# Patient Record
Sex: Female | Born: 1965 | Race: Black or African American | Hispanic: No | Marital: Single | Smoking: Never smoker
Health system: Southern US, Community
[De-identification: ages and names within clinical notes are randomized; demographics above are authoritative.]

## PROBLEM LIST (undated history)

## (undated) DIAGNOSIS — K219 Gastro-esophageal reflux disease without esophagitis: Secondary | ICD-10-CM

## (undated) DIAGNOSIS — Z973 Presence of spectacles and contact lenses: Secondary | ICD-10-CM

## (undated) DIAGNOSIS — D219 Benign neoplasm of connective and other soft tissue, unspecified: Secondary | ICD-10-CM

## (undated) DIAGNOSIS — G4700 Insomnia, unspecified: Secondary | ICD-10-CM

## (undated) DIAGNOSIS — I10 Essential (primary) hypertension: Secondary | ICD-10-CM

## (undated) DIAGNOSIS — K589 Irritable bowel syndrome without diarrhea: Secondary | ICD-10-CM

## (undated) DIAGNOSIS — E119 Type 2 diabetes mellitus without complications: Secondary | ICD-10-CM

## (undated) DIAGNOSIS — F419 Anxiety disorder, unspecified: Secondary | ICD-10-CM

## (undated) DIAGNOSIS — R319 Hematuria, unspecified: Secondary | ICD-10-CM

## (undated) DIAGNOSIS — E669 Obesity, unspecified: Secondary | ICD-10-CM

## (undated) DIAGNOSIS — R011 Cardiac murmur, unspecified: Secondary | ICD-10-CM

## (undated) DIAGNOSIS — K760 Fatty (change of) liver, not elsewhere classified: Secondary | ICD-10-CM

## (undated) DIAGNOSIS — C642 Malignant neoplasm of left kidney, except renal pelvis: Secondary | ICD-10-CM

## (undated) DIAGNOSIS — R945 Abnormal results of liver function studies: Secondary | ICD-10-CM

## (undated) DIAGNOSIS — N920 Excessive and frequent menstruation with regular cycle: Secondary | ICD-10-CM

## (undated) DIAGNOSIS — D649 Anemia, unspecified: Secondary | ICD-10-CM

## (undated) DIAGNOSIS — E559 Vitamin D deficiency, unspecified: Secondary | ICD-10-CM

## (undated) DIAGNOSIS — R3915 Urgency of urination: Secondary | ICD-10-CM

## (undated) DIAGNOSIS — M797 Fibromyalgia: Secondary | ICD-10-CM

## (undated) HISTORY — PX: WISDOM TOOTH EXTRACTION: SHX21

## (undated) HISTORY — PX: SKIN LESION EXCISION: SHX2412

## (undated) HISTORY — PX: BREAST BIOPSY: SHX20

## (undated) HISTORY — PX: COLONOSCOPY: SHX174

---

## 2010-12-30 HISTORY — PX: SKIN LESION EXCISION: SHX2412

## 2011-08-09 DIAGNOSIS — E669 Obesity, unspecified: Secondary | ICD-10-CM | POA: Insufficient documentation

## 2011-08-09 DIAGNOSIS — R238 Other skin changes: Secondary | ICD-10-CM | POA: Insufficient documentation

## 2011-08-09 DIAGNOSIS — R300 Dysuria: Secondary | ICD-10-CM | POA: Insufficient documentation

## 2011-08-09 HISTORY — DX: Dysuria: R30.0

## 2011-08-13 DIAGNOSIS — R519 Headache, unspecified: Secondary | ICD-10-CM | POA: Insufficient documentation

## 2011-08-13 DIAGNOSIS — G4700 Insomnia, unspecified: Secondary | ICD-10-CM | POA: Insufficient documentation

## 2011-08-13 HISTORY — DX: Headache, unspecified: R51.9

## 2013-12-14 DIAGNOSIS — N8003 Adenomyosis of the uterus: Secondary | ICD-10-CM | POA: Insufficient documentation

## 2016-01-05 ENCOUNTER — Other Ambulatory Visit: Payer: Self-pay | Admitting: Obstetrics & Gynecology

## 2016-01-08 DIAGNOSIS — R945 Abnormal results of liver function studies: Secondary | ICD-10-CM

## 2016-01-08 DIAGNOSIS — I10 Essential (primary) hypertension: Secondary | ICD-10-CM | POA: Insufficient documentation

## 2016-01-08 DIAGNOSIS — F419 Anxiety disorder, unspecified: Secondary | ICD-10-CM | POA: Insufficient documentation

## 2016-01-08 HISTORY — DX: Abnormal results of liver function studies: R94.5

## 2016-01-18 ENCOUNTER — Ambulatory Visit (HOSPITAL_COMMUNITY)
Admission: RE | Admit: 2016-01-18 | Payer: Managed Care, Other (non HMO) | Source: Ambulatory Visit | Admitting: Obstetrics & Gynecology

## 2016-01-18 ENCOUNTER — Encounter (HOSPITAL_COMMUNITY): Admission: RE | Payer: Self-pay | Source: Ambulatory Visit

## 2016-01-18 SURGERY — ROBOTIC ASSISTED TOTAL HYSTERECTOMY WITH SALPINGECTOMY
Anesthesia: General | Laterality: Bilateral

## 2016-02-02 DIAGNOSIS — E119 Type 2 diabetes mellitus without complications: Secondary | ICD-10-CM | POA: Insufficient documentation

## 2016-02-02 DIAGNOSIS — M7632 Iliotibial band syndrome, left leg: Secondary | ICD-10-CM

## 2016-02-02 HISTORY — DX: Iliotibial band syndrome, left leg: M76.32

## 2016-08-27 DIAGNOSIS — E7800 Pure hypercholesterolemia, unspecified: Secondary | ICD-10-CM | POA: Insufficient documentation

## 2016-08-27 DIAGNOSIS — G44229 Chronic tension-type headache, not intractable: Secondary | ICD-10-CM | POA: Insufficient documentation

## 2016-08-27 DIAGNOSIS — R7989 Other specified abnormal findings of blood chemistry: Secondary | ICD-10-CM | POA: Insufficient documentation

## 2016-08-27 DIAGNOSIS — E1169 Type 2 diabetes mellitus with other specified complication: Secondary | ICD-10-CM | POA: Insufficient documentation

## 2016-12-30 DIAGNOSIS — C642 Malignant neoplasm of left kidney, except renal pelvis: Secondary | ICD-10-CM

## 2016-12-30 HISTORY — DX: Malignant neoplasm of left kidney, except renal pelvis: C64.2

## 2016-12-30 HISTORY — PX: OTHER SURGICAL HISTORY: SHX169

## 2017-01-01 DIAGNOSIS — D649 Anemia, unspecified: Secondary | ICD-10-CM | POA: Insufficient documentation

## 2017-01-21 ENCOUNTER — Encounter: Payer: Self-pay | Admitting: Obstetrics & Gynecology

## 2017-06-03 ENCOUNTER — Ambulatory Visit (INDEPENDENT_AMBULATORY_CARE_PROVIDER_SITE_OTHER): Payer: Managed Care, Other (non HMO) | Admitting: Obstetrics & Gynecology

## 2017-06-03 ENCOUNTER — Encounter: Payer: Self-pay | Admitting: Obstetrics & Gynecology

## 2017-06-03 VITALS — BP 136/88

## 2017-06-03 DIAGNOSIS — D259 Leiomyoma of uterus, unspecified: Secondary | ICD-10-CM

## 2017-06-03 DIAGNOSIS — N921 Excessive and frequent menstruation with irregular cycle: Secondary | ICD-10-CM

## 2017-06-03 DIAGNOSIS — N898 Other specified noninflammatory disorders of vagina: Secondary | ICD-10-CM

## 2017-06-03 MED ORDER — FLUCONAZOLE 150 MG PO TABS: 150.0000 mg | ORAL_TABLET | Freq: Every day | ORAL | 1 refills | Status: AC

## 2017-06-03 NOTE — Progress Notes (Signed)
    Sheri Murphy 1966/01/20 [id number]        51 y.o.  G3P0A3  RP:  Metrorrhagia post DepoProvera 12/2016  HPI:  H/O Uterine Fibroids.  Last pelvic US 03/2015:  Uterus 145.3 cc, 3 myomas from 2+ to 3+ cm, normal endometrium, normal ovaries.  Had 1 DepoProvera 12/31/2016.  Irregular mild vaginal bleeding.  No pelvic pain.  Seen by Gustavo Lah at [facility] 04/30/2017 when she was started on Camila.  Past medical history,surgical history, problem list, medications, allergies, family history and social history were all reviewed and documented in the EPIC chart.  Directed ROS with pertinent positives and negatives documented in the history of present illness/assessment and plan.  Exam:  Vitals:   06/03/17 1543  BP: 136/88   General appearance:  Normal  Gyn exam:  Vulva normal.                    Speculum:  Increased vaginal d/c c/w yeast.  Cervix/Vagina otherwise normal.                    Bimanual exam: Uterus AV mildly increased in volume, mobile.  No adnexal mass felt.  Assessment/Plan:  51 y.o. G3P0A3  1. Metrorrhagia Post DepoProvera x 1 dose 12/2016.  Started on Camila 04/30/2017.  Recommended to continue, may improve until 3rd pack.  F/U Pelvic US to r/o Endometrial pathology and reevaluate the uterine fibroids. -Pelvic US-Non Ob, future  2. Uterine leiomyoma, unspecified location Known Uterine Myomas.  Last Pelvic US 03/2015.  Will repeat at f/u. -Pelvic US-Non Ob, future  3. Vaginal discharge Yeast Vaginitis present.  Fluconazole prescribed.  Instructions given.  Counseling on above issues >50% x 25 minutes.  [name] MD, 4:03 PM 06/03/2017

## 2017-06-06 ENCOUNTER — Other Ambulatory Visit: Payer: Self-pay | Admitting: Obstetrics & Gynecology

## 2017-06-06 ENCOUNTER — Telehealth: Payer: Self-pay | Admitting: Obstetrics & Gynecology

## 2017-06-06 DIAGNOSIS — N921 Excessive and frequent menstruation with irregular cycle: Secondary | ICD-10-CM

## 2017-06-06 DIAGNOSIS — D259 Leiomyoma of uterus, unspecified: Secondary | ICD-10-CM

## 2017-06-06 NOTE — Telephone Encounter (Signed)
Needs a Pelvic US, please schedule.

## 2017-06-06 NOTE — Patient Instructions (Signed)
1. Metrorrhagia Post DepoProvera x 1 dose 12/2016.  Started on Camila 04/30/2017.  Recommended to continue, may improve until 3rd pack.  F/U Pelvic US to r/o Endometrial pathology and reevaluate the uterine fibroids. -Pelvic US-Non Ob, future  2. Uterine leiomyoma, unspecified location Known Uterine Myomas.  Last Pelvic US 03/2015.  Will repeat at f/u. -Pelvic US-Non Ob, future  3. Vaginal discharge Yeast Vaginitis present.  Fluconazole prescribed.  Instructions given.  Sheri Murphy, it was a pleasure to see you today!  Please Schedule a pelvic US to reassess your uterine fibroids and evaluate the Endometrial lining.

## 2017-06-10 ENCOUNTER — Telehealth: Payer: Self-pay | Admitting: *Deleted

## 2017-06-10 DIAGNOSIS — D250 Submucous leiomyoma of uterus: Secondary | ICD-10-CM

## 2017-06-10 NOTE — Telephone Encounter (Signed)
Pt returned my call, I called her back and received her voicemail. Per DPR access I left a detailed message with recommendations, order placed for ultrasound. Asked pt to call front desk to schedule.

## 2017-06-10 NOTE — Telephone Encounter (Signed)
Left message for pt to call.

## 2017-06-10 NOTE — Telephone Encounter (Signed)
-----   Message from [name], MD sent at 06/06/2017  9:42 PM EDT ----- Regarding: Pelvic US to schedule Please call patient to inform that after revision of her chart, I recommend repeating a Pelvic US to assess her endometrial lining and reevaluate her fibroids.

## 2017-06-18 ENCOUNTER — Encounter: Payer: Self-pay | Admitting: Obstetrics & Gynecology

## 2017-06-18 ENCOUNTER — Other Ambulatory Visit: Payer: Self-pay | Admitting: Obstetrics & Gynecology

## 2017-06-18 ENCOUNTER — Ambulatory Visit (INDEPENDENT_AMBULATORY_CARE_PROVIDER_SITE_OTHER): Payer: Managed Care, Other (non HMO)

## 2017-06-18 ENCOUNTER — Ambulatory Visit (INDEPENDENT_AMBULATORY_CARE_PROVIDER_SITE_OTHER): Payer: Managed Care, Other (non HMO) | Admitting: Obstetrics & Gynecology

## 2017-06-18 VITALS — BP 118/76

## 2017-06-18 DIAGNOSIS — N852 Hypertrophy of uterus: Secondary | ICD-10-CM | POA: Diagnosis not present

## 2017-06-18 DIAGNOSIS — D251 Intramural leiomyoma of uterus: Secondary | ICD-10-CM

## 2017-06-18 DIAGNOSIS — N83202 Unspecified ovarian cyst, left side: Secondary | ICD-10-CM

## 2017-06-18 DIAGNOSIS — N921 Excessive and frequent menstruation with irregular cycle: Secondary | ICD-10-CM

## 2017-06-18 DIAGNOSIS — D250 Submucous leiomyoma of uterus: Secondary | ICD-10-CM

## 2017-06-18 NOTE — Patient Instructions (Signed)
1. Intramural leiomyoma of uterus No significant growth of Uterine fibroids in 2 years.  Normal pelvic US otherwise.  Reassured.  2. Metrorrhagia No vaginal bleeding x last visit.  Normal Endometrial line at 7.1 mm.  Will start [place] now.    F/U when due for Annual/Gyn visit.  Good to see you today Sheri Murphy!

## 2017-06-18 NOTE — Progress Notes (Signed)
    Keashia Haskins 02-Dec-1966 [id number]        51 y.o.  G3P0A3  RP:  Pelvic US for Metrorrhagia and f/u uterine Fibroids  HPI:  Camila not started yet because was waiting on period.  No pelvic pain.  Last Pelvic US 03/2015 at [facility] 3 Myomas from 2+ to 3+ cm, overall 145.3 cc Uterus.  DepoProvera 12/2016, only that one dose.    Past medical history,surgical history, problem list, medications, allergies, family history and social history were all reviewed and documented in the EPIC chart.  Directed ROS with pertinent positives and negatives documented in the history of present illness/assessment and plan.  Exam:  Vitals:   06/18/17 1635  BP: 118/76   General appearance:  Normal  Pelvic US today:  T/V and T/A anteverted heterogeneous intramural fibroid at 4.5 x 4.6 x 3.8 cm, 3.6 x 2.6 cm, 2.5 x 1.8 cm, 2.2 x 1.6 cm, 2.0 x 1.7 cm, 1.6 x 1.0 cm, 1.5 x 1.4 cm.  Endometrial line normal at 7.1 mm.  Right ovary normal.  Left ovary with a thin-walled echofree follicle 2.9 x 1.9 cm, neg CFD.  No FF in CDS.  Assessment/Plan:  51 y.o.   1. Intramural leiomyoma of uterus No significant growth of Uterine fibroids in 2 years.  Normal pelvic US otherwise.  Reassured.  2. Metrorrhagia No vaginal bleeding x last visit.  Normal Endometrial line at 7.1 mm.  Will start [place] now.    F/U when due for Annual/Gyn visit.  Counseling on above issues >50% x 15 minutes.  [name] MD, 4:46 PM 06/18/2017

## 2017-07-30 HISTORY — PX: OTHER SURGICAL HISTORY: SHX169

## 2017-08-18 DIAGNOSIS — Z85528 Personal history of other malignant neoplasm of kidney: Secondary | ICD-10-CM | POA: Insufficient documentation

## 2017-09-30 DIAGNOSIS — J309 Allergic rhinitis, unspecified: Secondary | ICD-10-CM | POA: Insufficient documentation

## 2017-09-30 DIAGNOSIS — F330 Major depressive disorder, recurrent, mild: Secondary | ICD-10-CM | POA: Insufficient documentation

## 2017-12-25 ENCOUNTER — Other Ambulatory Visit: Payer: Self-pay | Admitting: Obstetrics & Gynecology

## 2017-12-25 DIAGNOSIS — Z1231 Encounter for screening mammogram for malignant neoplasm of breast: Secondary | ICD-10-CM

## 2018-01-22 ENCOUNTER — Ambulatory Visit
Admission: RE | Admit: 2018-01-22 | Discharge: 2018-01-22 | Disposition: A | Discharge status: Home / Self Care | Payer: Managed Care, Other (non HMO) | Source: Ambulatory Visit | Attending: Obstetrics & Gynecology | Admitting: Obstetrics & Gynecology

## 2018-01-22 DIAGNOSIS — Z1231 Encounter for screening mammogram for malignant neoplasm of breast: Secondary | ICD-10-CM

## 2018-02-03 ENCOUNTER — Encounter: Payer: Managed Care, Other (non HMO) | Admitting: Obstetrics & Gynecology

## 2018-03-03 ENCOUNTER — Encounter: Payer: Self-pay | Admitting: Obstetrics & Gynecology

## 2018-03-03 ENCOUNTER — Ambulatory Visit: Payer: Managed Care, Other (non HMO) | Admitting: Obstetrics & Gynecology

## 2018-03-03 VITALS — BP 130/88

## 2018-03-03 DIAGNOSIS — D251 Intramural leiomyoma of uterus: Secondary | ICD-10-CM

## 2018-03-03 DIAGNOSIS — N920 Excessive and frequent menstruation with regular cycle: Secondary | ICD-10-CM | POA: Diagnosis not present

## 2018-03-03 LAB — CBC
HCT: 36.4 % (ref 35.0–45.0)
Hemoglobin: 12.5 g/dL (ref 11.7–15.5)
MCH: 27.3 pg (ref 27.0–33.0)
MCHC: 34.3 g/dL (ref 32.0–36.0)
MCV: 79.5 fL — ABNORMAL LOW (ref 80.0–100.0)
MPV: 10.7 fL (ref 7.5–12.5)
Platelets: 344 10*3/uL (ref 140–400)
RBC: 4.58 10*6/uL (ref 3.80–5.10)
RDW: 13.1 % (ref 11.0–15.0)
WBC: 7.2 10*3/uL (ref 3.8–10.8)

## 2018-03-03 NOTE — Patient Instructions (Signed)
1. Menorrhagia with regular cycle Menorrhagia probably secondary to intramural fibroids.  Will rule out anemia.  Patient taking Iron supplement.  Patient has tried the progestin IUD, Depo-Provera injections and the progestin pill without success in the past.  Declines further medical or hormonal treatments.  Endometrial ablation, myomectomies and hysterectomy would be possible approaches.  Patient is interested in endometrial ablation.  Will follow up with a pelvic ultrasound to reevaluate the fibroids and make sure the endometrial lining is normal.  Will decide on management per results. - CBC - US Transvaginal Non-OB; Future  2. Fibroids, intramural IM Fibroids 4.6, 3.6, 2.2 and 1.5 cm in 05/2017.  Will reevaluate by Pelvic US at f/u and decide on management. - CBC - US Transvaginal Non-OB; Future  Sheri Murphy, it was a pleasure seeing you today!  I will let you know your lab results as soon as they are available.  I will see you again soon for your pelvic ultrasound.   Endometrial Ablation Endometrial ablation is a procedure that destroys the thin inner layer of the lining of the uterus (endometrium). This procedure may be done:  To stop heavy periods.  To stop bleeding that is causing anemia.  To control irregular bleeding.  To treat bleeding caused by small tumors (fibroids) in the endometrium.  This procedure is often an alternative to major surgery, such as removal of the uterus and cervix (hysterectomy). As a result of this procedure:  You may not be able to have children. However, if you are premenopausal (you have not gone through menopause): ? You may still have a small chance of getting pregnant. ? You will need to use a reliable method of birth control after the procedure to prevent pregnancy.  You may stop having a menstrual period, or you may have only a small amount of bleeding during your period. Menstruation may return several years after the procedure.  Tell a health care  provider about:  Any allergies you have.  All medicines you are taking, including vitamins, herbs, eye drops, creams, and over-the-counter medicines.  Any problems you or family members have had with the use of anesthetic medicines.  Any blood disorders you have.  Any surgeries you have had.  Any medical conditions you have. What are the risks? Generally, this is a safe procedure. However, problems may occur, including:  A hole (perforation) in the uterus or bowel.  Infection of the uterus, bladder, or vagina.  Bleeding.  Damage to other structures or organs.  An air bubble in the lung (air embolus).  Problems with pregnancy after the procedure.  Failure of the procedure.  Decreased ability to diagnose cancer in the endometrium.  What happens before the procedure?  You will have tests of your endometrium to make sure there are no pre-cancerous cells or cancer cells present.  You may have an ultrasound of the uterus.  You may be given medicines to thin the endometrium.  Ask your health care provider about: ? Changing or stopping your regular medicines. This is especially important if you take diabetes medicines or blood thinners. ? Taking medicines such as aspirin and ibuprofen. These medicines can thin your blood. Do not take these medicines before your procedure if your doctor tells you not to.  Plan to have someone take you home from the hospital or clinic. What happens during the procedure?  You will lie on an exam table with your feet and legs supported as in a pelvic exam.  To lower your risk of  infection: ? Your health care team will wash or sanitize their hands and put on germ-free (sterile) gloves. ? Your genital area will be washed with soap.  An IV tube will be inserted into one of your veins.  You will be given a medicine to help you relax (sedative).  A surgical instrument with a light and camera (resectoscope) will be inserted into your vagina and  moved into your uterus. This allows your surgeon to see inside your uterus.  Endometrial tissue will be removed using one of the following methods: ? Radiofrequency. This method uses a radiofrequency-alternating electric current to remove the endometrium. ? Cryotherapy. This method uses extreme cold to freeze the endometrium. ? Heated-free liquid. This method uses a heated saltwater (saline) solution to remove the endometrium. ? Microwave. This method uses high-energy microwaves to heat up the endometrium and remove it. ? Thermal balloon. This method involves inserting a catheter with a balloon tip into the uterus. The balloon tip is filled with heated fluid to remove the endometrium. The procedure may vary among health care providers and hospitals. What happens after the procedure?  Your blood pressure, heart rate, breathing rate, and blood oxygen level will be monitored until the medicines you were given have worn off.  As tissue healing occurs, you may notice vaginal bleeding for 4-6 weeks after the procedure. You may also experience: ? Cramps. ? Thin, watery vaginal discharge that is light pink or brown in color. ? A need to urinate more frequently than usual. ? Nausea.  Do not drive for 24 hours if you were given a sedative.  Do not have sex or insert anything into your vagina until your health care provider approves. Summary  Endometrial ablation is done to treat the many causes of heavy menstrual bleeding.  The procedure may be done only after medications have been tried to control the bleeding.  Plan to have someone take you home from the hospital or clinic. This information is not intended to replace advice given to you by your health care provider. Make sure you discuss any questions you have with your health care provider. Document Released: 10/25/2004 Document Revised: 01/02/2017 Document Reviewed: 01/02/2017 Elsevier Interactive Patient Education  2017 [employer].

## 2018-03-03 NOTE — Progress Notes (Signed)
    Sheri Murphy [birth year] [id number]        52 y.o.  No obstetric history on file.   RP: Heavy periods for 4 months  HPI: Menses regular every month with heavy flow for 3 days and periods lasting overall 5 days.  Patient had no success in the past with the Mirena IUD.  Also tried Depo-Provera injection for 1 shot which produced irregular frequent bleeding and increase in weight.  Finally tried the progestin pill last year and stopped after 6 weeks of bleeding every day.  Per patient her ferritin was low at her last visit with her family physician and she is therefore taking iron supplements.  Known fibroids.  No pelvic pain.  No vasomotor symptoms of menopause.  Urine and bowel movements normal.  Patient is scheduled for next gynecologic annual exam in May 2019.   OB History  No data available    Past medical history,surgical history, problem list, medications, allergies, family history and social history were all reviewed and documented in the EPIC chart.   Directed ROS with pertinent positives and negatives documented in the history of present illness/assessment and plan.  Exam:  Vitals:   03/03/18 0934  BP: 130/88   General appearance:  Normal  Abdomen: Normal  Gynecologic exam: Vulva normal.  Bimanual exam: Uterus anteverted, mildly increased in volume and irregular, mobile, nontender.  No adnexal mass, nontender.  Pelvic US in 05/2017: T/V and T/A anteverted heterogeneous intramural fibroid at 4.5 x 4.6 x 3.8 cm, 3.6 x 2.6 cm, 2.5 x 1.8 cm, 2.2 x 1.6 cm, 2.0 x 1.7 cm, 1.6 x 1.0 cm, 1.5 x 1.4 cm.  Endometrial line normal at 7.1 mm.  Right ovary normal.  Left ovary with a thin-walled echofree follicle 2.9 x 1.9 cm, neg CFD.  No FF in CDS.  Assessment/Plan:  52 y.o. No obstetric history on file.   1. Menorrhagia with regular cycle Menorrhagia probably secondary to intramural fibroids.  Will rule out anemia.  Patient taking Iron supplement.  Patient has tried the progestin IUD,  Depo-Provera injections and the progestin pill without success in the past.  Declines further medical or hormonal treatments.  Endometrial ablation, myomectomies and hysterectomy would be possible approaches.  Patient is interested in endometrial ablation.  Will follow up with a pelvic ultrasound to reevaluate the fibroids and make sure the endometrial lining is normal.  Will decide on management per results. - CBC - US Transvaginal Non-OB; Future  2. Fibroids, intramural IM Fibroids 4.6, 3.6, 2.2 and 1.5 cm in 05/2017.  Will reevaluate by Pelvic US at f/u and decide on management. - CBC - US Transvaginal Non-OB; Future  Counseling on above issues more than 50% for 15 minutes.  [name] MD, 9:55 AM 03/03/2018

## 2018-03-23 ENCOUNTER — Other Ambulatory Visit: Payer: Managed Care, Other (non HMO)

## 2018-03-23 ENCOUNTER — Ambulatory Visit: Payer: Managed Care, Other (non HMO) | Admitting: Obstetrics & Gynecology

## 2018-03-30 ENCOUNTER — Ambulatory Visit: Payer: Managed Care, Other (non HMO) | Admitting: Women's Health

## 2018-03-30 ENCOUNTER — Encounter: Payer: Self-pay | Admitting: Women's Health

## 2018-03-30 VITALS — BP 122/82

## 2018-03-30 DIAGNOSIS — D259 Leiomyoma of uterus, unspecified: Secondary | ICD-10-CM | POA: Diagnosis not present

## 2018-03-30 DIAGNOSIS — N760 Acute vaginitis: Secondary | ICD-10-CM

## 2018-03-30 DIAGNOSIS — I10 Essential (primary) hypertension: Secondary | ICD-10-CM

## 2018-03-30 DIAGNOSIS — N898 Other specified noninflammatory disorders of vagina: Secondary | ICD-10-CM | POA: Diagnosis not present

## 2018-03-30 DIAGNOSIS — B9689 Other specified bacterial agents as the cause of diseases classified elsewhere: Secondary | ICD-10-CM

## 2018-03-30 DIAGNOSIS — E119 Type 2 diabetes mellitus without complications: Secondary | ICD-10-CM | POA: Insufficient documentation

## 2018-03-30 LAB — WET PREP FOR TRICH, YEAST, CLUE

## 2018-03-30 MED ORDER — METRONIDAZOLE 500 MG PO TABS: 500.0000 mg | ORAL_TABLET | Freq: Two times a day (BID) | ORAL | 0 refills | Status: DC

## 2018-03-30 MED ORDER — FLUCONAZOLE 150 MG PO TABS: 150.0000 mg | ORAL_TABLET | Freq: Once | ORAL | 1 refills | Status: AC

## 2018-03-30 MED ORDER — IBUPROFEN 600 MG PO TABS: 600.0000 mg | ORAL_TABLET | Freq: Three times a day (TID) | ORAL | 1 refills | Status: DC | PRN

## 2018-03-30 NOTE — Patient Instructions (Signed)

## 2018-03-30 NOTE — Progress Notes (Signed)
52 year old old BF presents with complaint of white vaginal discharge, irritation with itching for the past 2 weeks.  Questionable partner infidelity .  Denies urinary symptoms, abdominal pain or fever.  Monthly regular 5-day cycle,  first 3 days very heavy.  History of multiple fibroids, did not have good relief with Mirena IUD, Depo-Provera or Micronor.  Wears both tampon and pad and needs to change at least every 2 hours for the first 3 days.  Medical problems include hypertension and diabetes.  Desk job in Engineer, technical sales at [employer].  Exam: Appears well.  External genitalia mild erythema and introitus, speculum exams scant clear discharge noted, GC/chlamydia culture taken, wet prep positive for clues, many bacteria.  Bimanual uterus enlarged/fibroid uterus.  Bacterial vaginosis STD screen Fibroid uterus with menorrhagia  Plan: Flagyl 500 twice daily for 7 days, alcohol precautions reviewed.  Diflucan 150 p.o. x1 dose for itching.  Instructed to call if no relief of symptoms.  Motrin 600 every 8 hours for 3 days of cycle.  Other options reviewed, reviewed if decides to proceed with endometrial ablation to schedule sonohysterogram with Dr. Dellis Filbert.  GC/Chlamydia culture pending, will check HIV, hepatitis and RPR and annual exam.

## 2018-03-31 LAB — C. TRACHOMATIS/N. GONORRHOEAE RNA
C. trachomatis RNA, TMA: NOT DETECTED
N. gonorrhoeae RNA, TMA: NOT DETECTED

## 2018-04-08 ENCOUNTER — Ambulatory Visit: Payer: Managed Care, Other (non HMO) | Admitting: Obstetrics & Gynecology

## 2018-04-08 ENCOUNTER — Ambulatory Visit (INDEPENDENT_AMBULATORY_CARE_PROVIDER_SITE_OTHER): Payer: Managed Care, Other (non HMO)

## 2018-04-08 ENCOUNTER — Encounter: Payer: Self-pay | Admitting: Obstetrics & Gynecology

## 2018-04-08 VITALS — BP 120/82

## 2018-04-08 DIAGNOSIS — D219 Benign neoplasm of connective and other soft tissue, unspecified: Secondary | ICD-10-CM | POA: Diagnosis not present

## 2018-04-08 DIAGNOSIS — N920 Excessive and frequent menstruation with regular cycle: Secondary | ICD-10-CM

## 2018-04-08 DIAGNOSIS — D251 Intramural leiomyoma of uterus: Secondary | ICD-10-CM | POA: Diagnosis not present

## 2018-04-08 NOTE — Progress Notes (Signed)
    Sheri Murphy 06-25-1966 [id number]        52 y.o.    RP: Menorrhagia with uterine fibroids  HPI: Heavy menstrual flow.  Maintain uterine fibroids per ultrasound June 2018.  Has tried progesterone IUD, Depo-Provera and the progestin pill without success previously.   OB History  No data available    Past medical history,surgical history, problem list, medications, allergies, family history and social history were all reviewed and documented in the EPIC chart.   Directed ROS with pertinent positives and negatives documented in the history of present illness/assessment and plan.  Exam:  Vitals:   04/08/18 1533  BP: 120/82   General appearance:  Normal  Pelvic US today: T/V and T/A images.  Anteverted uterus measuring 9.49 x 6.82 x 6.58 cm.  Intramural fibroids measuring 2 x 1.6, 2.3 x 1.6, 3.2 x 2.7, 3.3 x 3.1, 1.6 x 1.5, 2.1 x 2.2, 1.7 x 2.0 cm.  On the transvaginal images the endometrium is displaced by some fibroids.  Endometrial lining tri-layered at 10.1 mm.  Right ovary normal.  Left ovary normal with a dominant follicle measured at 1.9 cm.  Right and left adnexa negative.  No free fluid in the posterior cul-de-sac.   Assessment/Plan:  52 y.o. No obstetric history on file.   1. Menorrhagia with regular cycle Unsuccessful attempt to control with the progesterone IUD, Depo-Provera and progestin pills.  Patient interested in endometrial ablation.  Patient informed that given the possibility of submucosal myomas, we would proceed with a hysteroscopy and excision of the fibroids as needed with a D&C followed by the NovaSure endometrial ablation.  Patient is not ready to make a decision today, therefore she will call back with her decision.  2. Fibroids Some fibroids are partially submucosal as the endometrium is displaced.  Counseling on above issues and coordination of care more than 50% for 15 minutes.  [name] MD, 3:41 PM 04/08/2018

## 2018-04-12 ENCOUNTER — Encounter: Payer: Self-pay | Admitting: Obstetrics & Gynecology

## 2018-04-12 NOTE — Patient Instructions (Signed)
1. Menorrhagia with regular cycle Unsuccessful attempt to control with the progesterone IUD, Depo-Provera and progestin pills.  Patient interested in endometrial ablation.  Patient informed that given the possibility of submucosal myomas, we would proceed with a hysteroscopy and excision of the fibroids as needed with a D&C followed by the NovaSure endometrial ablation.  Patient is not ready to make a decision today, therefore she will call back with her decision.  2. Fibroids Some fibroids are partially submucosal as the endometrium is displaced.  Ryelynn, it was a pleasure seeing you today!   Endometrial Ablation Endometrial ablation is a procedure that destroys the thin inner layer of the lining of the uterus (endometrium). This procedure may be done:  To stop heavy periods.  To stop bleeding that is causing anemia.  To control irregular bleeding.  To treat bleeding caused by small tumors (fibroids) in the endometrium.  This procedure is often an alternative to major surgery, such as removal of the uterus and cervix (hysterectomy). As a result of this procedure:  You may not be able to have children. However, if you are premenopausal (you have not gone through menopause): ? You may still have a small chance of getting pregnant. ? You will need to use a reliable method of birth control after the procedure to prevent pregnancy.  You may stop having a menstrual period, or you may have only a small amount of bleeding during your period. Menstruation may return several years after the procedure.  Tell a health care provider about:  Any allergies you have.  All medicines you are taking, including vitamins, herbs, eye drops, creams, and over-the-counter medicines.  Any problems you or family members have had with the use of anesthetic medicines.  Any blood disorders you have.  Any surgeries you have had.  Any medical conditions you have. What are the risks? Generally, this is a  safe procedure. However, problems may occur, including:  A hole (perforation) in the uterus or bowel.  Infection of the uterus, bladder, or vagina.  Bleeding.  Damage to other structures or organs.  An air bubble in the lung (air embolus).  Problems with pregnancy after the procedure.  Failure of the procedure.  Decreased ability to diagnose cancer in the endometrium.  What happens before the procedure?  You will have tests of your endometrium to make sure there are no pre-cancerous cells or cancer cells present.  You may have an ultrasound of the uterus.  You may be given medicines to thin the endometrium.  Ask your health care provider about: ? Changing or stopping your regular medicines. This is especially important if you take diabetes medicines or blood thinners. ? Taking medicines such as aspirin and ibuprofen. These medicines can thin your blood. Do not take these medicines before your procedure if your doctor tells you not to.  Plan to have someone take you home from the hospital or clinic. What happens during the procedure?  You will lie on an exam table with your feet and legs supported as in a pelvic exam.  To lower your risk of infection: ? Your health care team will wash or sanitize their hands and put on germ-free (sterile) gloves. ? Your genital area will be washed with soap.  An IV tube will be inserted into one of your veins.  You will be given a medicine to help you relax (sedative).  A surgical instrument with a light and camera (resectoscope) will be inserted into your vagina and moved into  your uterus. This allows your surgeon to see inside your uterus.  Endometrial tissue will be removed using one of the following methods: ? Radiofrequency. This method uses a radiofrequency-alternating electric current to remove the endometrium. ? Cryotherapy. This method uses extreme cold to freeze the endometrium. ? Heated-free liquid. This method uses a heated  saltwater (saline) solution to remove the endometrium. ? Microwave. This method uses high-energy microwaves to heat up the endometrium and remove it. ? Thermal balloon. This method involves inserting a catheter with a balloon tip into the uterus. The balloon tip is filled with heated fluid to remove the endometrium. The procedure may vary among health care providers and hospitals. What happens after the procedure?  Your blood pressure, heart rate, breathing rate, and blood oxygen level will be monitored until the medicines you were given have worn off.  As tissue healing occurs, you may notice vaginal bleeding for 4-6 weeks after the procedure. You may also experience: ? Cramps. ? Thin, watery vaginal discharge that is light pink or brown in color. ? A need to urinate more frequently than usual. ? Nausea.  Do not drive for 24 hours if you were given a sedative.  Do not have sex or insert anything into your vagina until your health care provider approves. Summary  Endometrial ablation is done to treat the many causes of heavy menstrual bleeding.  The procedure may be done only after medications have been tried to control the bleeding.  Plan to have someone take you home from the hospital or clinic. This information is not intended to replace advice given to you by your health care provider. Make sure you discuss any questions you have with your health care provider. Document Released: 10/25/2004 Document Revised: 01/02/2017 Document Reviewed: 01/02/2017 Elsevier Interactive Patient Education  2017 [employer].

## 2018-04-14 DIAGNOSIS — K5900 Constipation, unspecified: Secondary | ICD-10-CM | POA: Insufficient documentation

## 2018-04-14 DIAGNOSIS — K219 Gastro-esophageal reflux disease without esophagitis: Secondary | ICD-10-CM | POA: Insufficient documentation

## 2018-04-15 ENCOUNTER — Telehealth: Payer: Self-pay

## 2018-04-15 NOTE — Telephone Encounter (Signed)
Patient in voice mail stating she is ready to schedule ablation and was to let you know when ready.   Please send order. Thanks.

## 2018-04-28 ENCOUNTER — Encounter: Payer: Self-pay | Admitting: Anesthesiology

## 2018-05-06 ENCOUNTER — Telehealth: Payer: Self-pay

## 2018-05-06 NOTE — Telephone Encounter (Signed)
I called patient because her [employer] Ins plan started over 04/29/18 and I had told her I would recheck her ins benefits to be sure all remained the same. I did that and they changed a little bit and I called and made her aware of the changes. I told her it only changed the surgery prepymt amount slightly and since it is an estimate anyway I was not going to change the amount GGA surgery prepymt I initally told her but I just wanted her to be aware her benefits had changed slightly.

## 2018-05-07 ENCOUNTER — Encounter (HOSPITAL_BASED_OUTPATIENT_CLINIC_OR_DEPARTMENT_OTHER): Payer: Self-pay

## 2018-05-08 ENCOUNTER — Encounter (HOSPITAL_BASED_OUTPATIENT_CLINIC_OR_DEPARTMENT_OTHER): Payer: Self-pay

## 2018-05-08 ENCOUNTER — Other Ambulatory Visit: Payer: Self-pay

## 2018-05-08 NOTE — Progress Notes (Signed)
Spoke with:  Quorra NPO:  After Midnight, no gum, candy, or mints   Arrival time: 0930AM Labs:  UPT (CBC, CMP, EKG 05/12/2018 in epic) AM medications:  Omeprazole Pre op orders: Yes Ride home:  Justus Memory (friend) [phone]

## 2018-05-11 ENCOUNTER — Telehealth: Payer: Self-pay

## 2018-05-11 NOTE — Progress Notes (Signed)
Ms. Bailey called to let us know her ride home for surgery will be Gonzella Lex (brother) [phone] instead of Tonya.

## 2018-05-11 NOTE — Telephone Encounter (Addendum)
Patient scheduled for [facility] Hysteroscopy with Myosure on Weds., 05/13/18.  She questions if anything on her med list she should d/c between now and surgery date. She asked specifically about Ibuprofen.  Also, she asked what to expect as far as the "pain level/discomfort level after procedure".  I did tell patient that I would check with Dr. Marguerita Merles but review of her med list looks like all her medications are probably fine to take. If she can avoid the Ibuprofen that is fine but this is a procedure with low risk of bleeding so not as worrisome to take it as some other procedures might be.  I told her it is a simple procedure and her discomfort should be minimal. Maybe some cramping/bloating and she will need to rest from the anesthesia. I reassured her she will do great.  Dr. Russ Halo this all sound ok??

## 2018-05-12 ENCOUNTER — Encounter (HOSPITAL_COMMUNITY)
Admission: RE | Admit: 2018-05-12 | Discharge: 2018-05-12 | Disposition: A | Discharge status: Home / Self Care | Payer: Managed Care, Other (non HMO) | Source: Ambulatory Visit | Attending: Obstetrics & Gynecology | Admitting: Obstetrics & Gynecology

## 2018-05-12 DIAGNOSIS — D259 Leiomyoma of uterus, unspecified: Secondary | ICD-10-CM | POA: Diagnosis present

## 2018-05-12 DIAGNOSIS — Z6830 Body mass index (BMI) 30.0-30.9, adult: Secondary | ICD-10-CM | POA: Diagnosis not present

## 2018-05-12 DIAGNOSIS — K219 Gastro-esophageal reflux disease without esophagitis: Secondary | ICD-10-CM | POA: Diagnosis not present

## 2018-05-12 DIAGNOSIS — Z85528 Personal history of other malignant neoplasm of kidney: Secondary | ICD-10-CM | POA: Diagnosis not present

## 2018-05-12 DIAGNOSIS — M797 Fibromyalgia: Secondary | ICD-10-CM | POA: Diagnosis not present

## 2018-05-12 DIAGNOSIS — E669 Obesity, unspecified: Secondary | ICD-10-CM | POA: Diagnosis not present

## 2018-05-12 DIAGNOSIS — D251 Intramural leiomyoma of uterus: Secondary | ICD-10-CM | POA: Diagnosis not present

## 2018-05-12 DIAGNOSIS — Z79899 Other long term (current) drug therapy: Secondary | ICD-10-CM | POA: Diagnosis not present

## 2018-05-12 DIAGNOSIS — I10 Essential (primary) hypertension: Secondary | ICD-10-CM | POA: Diagnosis not present

## 2018-05-12 DIAGNOSIS — E119 Type 2 diabetes mellitus without complications: Secondary | ICD-10-CM | POA: Diagnosis not present

## 2018-05-12 DIAGNOSIS — D649 Anemia, unspecified: Secondary | ICD-10-CM | POA: Diagnosis not present

## 2018-05-12 DIAGNOSIS — N840 Polyp of corpus uteri: Secondary | ICD-10-CM | POA: Diagnosis not present

## 2018-05-12 DIAGNOSIS — Z791 Long term (current) use of non-steroidal anti-inflammatories (NSAID): Secondary | ICD-10-CM | POA: Diagnosis not present

## 2018-05-12 LAB — CBC
HCT: 37.9 % (ref 36.0–46.0)
Hemoglobin: 12.6 g/dL (ref 12.0–15.0)
MCH: 26.9 pg (ref 26.0–34.0)
MCHC: 33.2 g/dL (ref 30.0–36.0)
MCV: 80.8 fL (ref 78.0–100.0)
Platelets: 374 10*3/uL (ref 150–400)
RBC: 4.69 MIL/uL (ref 3.87–5.11)
RDW: 13.7 % (ref 11.5–15.5)
WBC: 8.6 10*3/uL (ref 4.0–10.5)

## 2018-05-12 LAB — COMPREHENSIVE METABOLIC PANEL
ALT: 42 U/L (ref 14–54)
AST: 34 U/L (ref 15–41)
Albumin: 4 g/dL (ref 3.5–5.0)
Alkaline Phosphatase: 41 U/L (ref 38–126)
Anion gap: 10 (ref 5–15)
BUN: 12 mg/dL (ref 6–20)
CO2: 23 mmol/L (ref 22–32)
Calcium: 8.7 mg/dL — ABNORMAL LOW (ref 8.9–10.3)
Chloride: 101 mmol/L (ref 101–111)
Creatinine, Ser: 0.84 mg/dL (ref 0.44–1.00)
GFR calc Af Amer: >60 mL/min (ref >60)
GFR calc non Af Amer: >60 mL/min (ref >60)
Glucose, Bld: 123 mg/dL — ABNORMAL HIGH (ref 65–99)
Potassium: 3.7 mmol/L (ref 3.5–5.1)
Sodium: 134 mmol/L — ABNORMAL LOW (ref 135–145)
Total Bilirubin: 0.3 mg/dL (ref 0.3–1.2)
Total Protein: 7.3 g/dL (ref 6.5–8.1)

## 2018-05-12 NOTE — Telephone Encounter (Signed)
Called and left detailed message in voice mail per DPR access note on file and let her know that Dr. Dellis Filbert agrees with the things I advised her about yesterday.

## 2018-05-12 NOTE — Telephone Encounter (Signed)
I agree with your counseling on everything.

## 2018-05-13 ENCOUNTER — Other Ambulatory Visit: Payer: Self-pay

## 2018-05-13 ENCOUNTER — Encounter (HOSPITAL_BASED_OUTPATIENT_CLINIC_OR_DEPARTMENT_OTHER)
Admission: RE | Disposition: A | Discharge status: Home / Self Care | Payer: Self-pay | Source: Ambulatory Visit | Attending: Obstetrics & Gynecology

## 2018-05-13 ENCOUNTER — Ambulatory Visit (HOSPITAL_BASED_OUTPATIENT_CLINIC_OR_DEPARTMENT_OTHER): Payer: Managed Care, Other (non HMO) | Admitting: Anesthesiology

## 2018-05-13 ENCOUNTER — Ambulatory Visit (HOSPITAL_BASED_OUTPATIENT_CLINIC_OR_DEPARTMENT_OTHER)
Admission: RE | Admit: 2018-05-13 | Discharge: 2018-05-13 | Disposition: A | Discharge status: Home / Self Care | Payer: Managed Care, Other (non HMO) | Source: Ambulatory Visit | Attending: Obstetrics & Gynecology | Admitting: Obstetrics & Gynecology

## 2018-05-13 ENCOUNTER — Encounter (HOSPITAL_BASED_OUTPATIENT_CLINIC_OR_DEPARTMENT_OTHER): Payer: Self-pay | Admitting: *Deleted

## 2018-05-13 DIAGNOSIS — M797 Fibromyalgia: Secondary | ICD-10-CM | POA: Insufficient documentation

## 2018-05-13 DIAGNOSIS — D251 Intramural leiomyoma of uterus: Secondary | ICD-10-CM | POA: Insufficient documentation

## 2018-05-13 DIAGNOSIS — N840 Polyp of corpus uteri: Secondary | ICD-10-CM | POA: Diagnosis not present

## 2018-05-13 DIAGNOSIS — E669 Obesity, unspecified: Secondary | ICD-10-CM | POA: Insufficient documentation

## 2018-05-13 DIAGNOSIS — K219 Gastro-esophageal reflux disease without esophagitis: Secondary | ICD-10-CM | POA: Insufficient documentation

## 2018-05-13 DIAGNOSIS — Z6830 Body mass index (BMI) 30.0-30.9, adult: Secondary | ICD-10-CM | POA: Insufficient documentation

## 2018-05-13 DIAGNOSIS — Z791 Long term (current) use of non-steroidal anti-inflammatories (NSAID): Secondary | ICD-10-CM | POA: Insufficient documentation

## 2018-05-13 DIAGNOSIS — D649 Anemia, unspecified: Secondary | ICD-10-CM | POA: Insufficient documentation

## 2018-05-13 DIAGNOSIS — I10 Essential (primary) hypertension: Secondary | ICD-10-CM | POA: Insufficient documentation

## 2018-05-13 DIAGNOSIS — E119 Type 2 diabetes mellitus without complications: Secondary | ICD-10-CM | POA: Insufficient documentation

## 2018-05-13 DIAGNOSIS — Z85528 Personal history of other malignant neoplasm of kidney: Secondary | ICD-10-CM | POA: Insufficient documentation

## 2018-05-13 DIAGNOSIS — N920 Excessive and frequent menstruation with regular cycle: Secondary | ICD-10-CM | POA: Diagnosis not present

## 2018-05-13 DIAGNOSIS — Z79899 Other long term (current) drug therapy: Secondary | ICD-10-CM | POA: Insufficient documentation

## 2018-05-13 HISTORY — PX: DILATATION & CURETTAGE/HYSTEROSCOPY WITH MYOSURE: SHX6511

## 2018-05-13 HISTORY — DX: Cardiac murmur, unspecified: R01.1

## 2018-05-13 HISTORY — DX: Obesity, unspecified: E66.9

## 2018-05-13 HISTORY — DX: Fatty (change of) liver, not elsewhere classified: K76.0

## 2018-05-13 HISTORY — DX: Essential (primary) hypertension: I10

## 2018-05-13 HISTORY — DX: Type 2 diabetes mellitus without complications: E11.9

## 2018-05-13 HISTORY — DX: Gastro-esophageal reflux disease without esophagitis: K21.9

## 2018-05-13 HISTORY — DX: Anemia, unspecified: D64.9

## 2018-05-13 HISTORY — DX: Insomnia, unspecified: G47.00

## 2018-05-13 HISTORY — DX: Fibromyalgia: M79.7

## 2018-05-13 HISTORY — DX: Abnormal results of liver function studies: R94.5

## 2018-05-13 HISTORY — DX: Irritable bowel syndrome, unspecified: K58.9

## 2018-05-13 HISTORY — DX: Excessive and frequent menstruation with regular cycle: N92.0

## 2018-05-13 HISTORY — DX: Anxiety disorder, unspecified: F41.9

## 2018-05-13 HISTORY — DX: Hematuria, unspecified: R31.9

## 2018-05-13 HISTORY — DX: Malignant neoplasm of left kidney, except renal pelvis: C64.2

## 2018-05-13 HISTORY — DX: Benign neoplasm of connective and other soft tissue, unspecified: D21.9

## 2018-05-13 LAB — POCT PREGNANCY, URINE: Preg Test, Ur: NEGATIVE

## 2018-05-13 LAB — GLUCOSE, CAPILLARY: Glucose-Capillary: 104 mg/dL — ABNORMAL HIGH (ref 65–99)

## 2018-05-13 SURGERY — DILATATION & CURETTAGE/HYSTEROSCOPY WITH MYOSURE
Anesthesia: General | Site: Uterus

## 2018-05-13 MED ORDER — CEFAZOLIN SODIUM-DEXTROSE 2-4 GM/100ML-% IV SOLN
INTRAVENOUS | Status: AC
  Filled 2018-05-13: qty 100

## 2018-05-13 MED ORDER — LABETALOL HCL 5 MG/ML IV SOLN
10.0000 mg | INTRAVENOUS | Status: DC | PRN
  Administered 2018-05-13: 10 mg via INTRAVENOUS
  Filled 2018-05-13: qty 4

## 2018-05-13 MED ORDER — OXYCODONE HCL 5 MG/5ML PO SOLN
5.0000 mg | Freq: Once | ORAL | Status: DC | PRN
  Filled 2018-05-13: qty 5

## 2018-05-13 MED ORDER — FENTANYL CITRATE (PF) 100 MCG/2ML IJ SOLN
INTRAMUSCULAR | Status: DC | PRN
  Administered 2018-05-13 (×2): 50 ug via INTRAVENOUS

## 2018-05-13 MED ORDER — SODIUM CHLORIDE 0.9 % IR SOLN
Status: DC | PRN
  Administered 2018-05-13: 900 mL

## 2018-05-13 MED ORDER — OXYCODONE HCL 5 MG PO TABS
5.0000 mg | ORAL_TABLET | Freq: Once | ORAL | Status: DC | PRN
  Filled 2018-05-13: qty 1

## 2018-05-13 MED ORDER — DEXAMETHASONE SODIUM PHOSPHATE 4 MG/ML IJ SOLN
INTRAMUSCULAR | Status: DC | PRN
  Administered 2018-05-13: 10 mg via INTRAVENOUS

## 2018-05-13 MED ORDER — FENTANYL CITRATE (PF) 100 MCG/2ML IJ SOLN
25.0000 ug | INTRAMUSCULAR | Status: DC | PRN
  Filled 2018-05-13: qty 1

## 2018-05-13 MED ORDER — PROPOFOL 10 MG/ML IV BOLUS
INTRAVENOUS | Status: AC
  Filled 2018-05-13: qty 20

## 2018-05-13 MED ORDER — CEFAZOLIN SODIUM-DEXTROSE 2-4 GM/100ML-% IV SOLN
2.0000 g | INTRAVENOUS | Status: AC
  Administered 2018-05-13: 2 g via INTRAVENOUS
  Filled 2018-05-13: qty 100

## 2018-05-13 MED ORDER — LABETALOL HCL 5 MG/ML IV SOLN
INTRAVENOUS | Status: AC
  Filled 2018-05-13: qty 4

## 2018-05-13 MED ORDER — LIDOCAINE 2% (20 MG/ML) 5 ML SYRINGE
INTRAMUSCULAR | Status: AC
  Filled 2018-05-13: qty 5

## 2018-05-13 MED ORDER — SODIUM CHLORIDE 0.9 % IV SOLN
INTRAVENOUS | Status: DC
  Administered 2018-05-13: 10:00:00 via INTRAVENOUS
  Filled 2018-05-13: qty 1000

## 2018-05-13 MED ORDER — ONDANSETRON HCL 4 MG/2ML IJ SOLN
INTRAMUSCULAR | Status: DC | PRN
  Administered 2018-05-13: 4 mg via INTRAVENOUS

## 2018-05-13 MED ORDER — KETOROLAC TROMETHAMINE 30 MG/ML IJ SOLN
INTRAMUSCULAR | Status: AC
  Filled 2018-05-13: qty 1

## 2018-05-13 MED ORDER — PROPOFOL 10 MG/ML IV BOLUS
INTRAVENOUS | Status: DC | PRN
  Administered 2018-05-13: 200 mg via INTRAVENOUS

## 2018-05-13 MED ORDER — LACTATED RINGERS IV SOLN
INTRAVENOUS | Status: DC
  Filled 2018-05-13: qty 1000

## 2018-05-13 MED ORDER — LIDOCAINE HCL 1 % IJ SOLN
INTRAMUSCULAR | Status: DC | PRN
  Administered 2018-05-13: 20 mL

## 2018-05-13 MED ORDER — FENTANYL CITRATE (PF) 100 MCG/2ML IJ SOLN
INTRAMUSCULAR | Status: AC
  Filled 2018-05-13: qty 2

## 2018-05-13 MED ORDER — KETOROLAC TROMETHAMINE 30 MG/ML IJ SOLN
INTRAMUSCULAR | Status: DC | PRN
  Administered 2018-05-13: 30 mg via INTRAVENOUS

## 2018-05-13 MED ORDER — MIDAZOLAM HCL 5 MG/5ML IJ SOLN
INTRAMUSCULAR | Status: DC | PRN
  Administered 2018-05-13: 2 mg via INTRAVENOUS

## 2018-05-13 MED ORDER — DEXAMETHASONE SODIUM PHOSPHATE 10 MG/ML IJ SOLN
INTRAMUSCULAR | Status: AC
  Filled 2018-05-13: qty 1

## 2018-05-13 MED ORDER — ONDANSETRON HCL 4 MG/2ML IJ SOLN
4.0000 mg | Freq: Four times a day (QID) | INTRAMUSCULAR | Status: DC | PRN
  Filled 2018-05-13: qty 2

## 2018-05-13 MED ORDER — ONDANSETRON HCL 4 MG/2ML IJ SOLN
INTRAMUSCULAR | Status: AC
  Filled 2018-05-13: qty 2

## 2018-05-13 MED ORDER — LIDOCAINE HCL 1 % IJ SOLN
INTRAMUSCULAR | Status: AC
  Filled 2018-05-13: qty 20

## 2018-05-13 MED ORDER — LIDOCAINE 2% (20 MG/ML) 5 ML SYRINGE
INTRAMUSCULAR | Status: DC | PRN
  Administered 2018-05-13: 100 mg via INTRAVENOUS

## 2018-05-13 MED ORDER — MIDAZOLAM HCL 2 MG/2ML IJ SOLN
INTRAMUSCULAR | Status: AC
  Filled 2018-05-13: qty 2

## 2018-05-13 SURGICAL SUPPLY — 26 items
ABLATOR ENDOMETRIAL BIPOLAR (ABLATOR) ×4 IMPLANT
CANISTER SUCT 3000ML PPV (MISCELLANEOUS) ×4 IMPLANT
CATH ROBINSON RED A/P 16FR (CATHETERS) ×4 IMPLANT
CONTAINER PREFILL 10% NBF 60ML (FORM) IMPLANT
COUNTER NEEDLE 1200 MAGNETIC (NEEDLE) IMPLANT
DEVICE MYOSURE LITE (MISCELLANEOUS) IMPLANT
DEVICE MYOSURE REACH (MISCELLANEOUS) IMPLANT
DILATOR CANAL MILEX (MISCELLANEOUS) IMPLANT
ELECT REM PT RETURN 9FT ADLT (ELECTROSURGICAL)
ELECTRODE REM PT RTRN 9FT ADLT (ELECTROSURGICAL) IMPLANT
FILTER ARTHROSCOPY CONVERTOR (FILTER) IMPLANT
GLOVE BIO SURGEON STRL SZ 6.5 (GLOVE) ×3 IMPLANT
GLOVE BIO SURGEONS STRL SZ 6.5 (GLOVE) ×1
GLOVE BIOGEL PI IND STRL 7.0 (GLOVE) ×4 IMPLANT
GLOVE BIOGEL PI INDICATOR 7.0 (GLOVE) ×4
GOWN STRL REUS W/TWL LRG LVL3 (GOWN DISPOSABLE) ×8 IMPLANT
IV NS IRRIG 3000ML ARTHROMATIC (IV SOLUTION) ×4 IMPLANT
MYOSURE XL FIBROID REM (MISCELLANEOUS)
PACK VAGINAL MINOR WOMEN LF (CUSTOM PROCEDURE TRAY) ×4 IMPLANT
PAD OB MATERNITY 4.3X12.25 (PERSONAL CARE ITEMS) ×4 IMPLANT
PAD PREP 24X48 CUFFED NSTRL (MISCELLANEOUS) ×4 IMPLANT
SEAL ROD LENS SCOPE MYOSURE (ABLATOR) ×4 IMPLANT
SYSTEM TISS REMOVAL MYSR XL RM (MISCELLANEOUS) IMPLANT
TOWEL OR 17X24 6PK STRL BLUE (TOWEL DISPOSABLE) ×8 IMPLANT
TUBING AQUILEX INFLOW (TUBING) ×4 IMPLANT
TUBING AQUILEX OUTFLOW (TUBING) ×4 IMPLANT

## 2018-05-13 NOTE — Anesthesia Procedure Notes (Signed)
Procedure Name: LMA Insertion Date/Time: 05/13/2018 11:16 AM Performed by: Albertha Ghee, MD Pre-anesthesia Checklist: Patient identified, Emergency Drugs available, Suction available and Patient being monitored Patient Re-evaluated:Patient Re-evaluated prior to induction Oxygen Delivery Method: Circle system utilized Preoxygenation: Pre-oxygenation with 100% oxygen Induction Type: IV induction Ventilation: Mask ventilation without difficulty LMA: LMA inserted LMA Size: 4.0 Number of attempts: 1 Airway Equipment and Method: Bite block Placement Confirmation: positive ETCO2 Tube secured with: Tape Dental Injury: Teeth and Oropharynx as per pre-operative assessment

## 2018-05-13 NOTE — Discharge Instructions (Addendum)
Hysteroscopy, Care After Refer to this sheet in the next few weeks. These instructions provide you with information on caring for yourself after your procedure. Your health care provider may also give you more specific instructions. Your treatment has been planned according to current medical practices, but problems sometimes occur. Call your health care provider if you have any problems or questions after your procedure. What can I expect after the procedure? After your procedure, it is typical to have the following:  You may have some cramping. This normally lasts for a couple days.  You may have bleeding. This can vary from light spotting for a few days to menstrual-like bleeding for 3-7 days.  Follow these instructions at home:  Rest for the first 1-2 days after the procedure.  Only take over-the-counter or prescription medicines as directed by your health care provider. Do not take aspirin. It can increase the chances of bleeding.  Take showers instead of baths for 2 weeks or as directed by your health care provider.  Do not drive for 24 hours or as directed.  Do not drink alcohol while taking pain medicine.  Do not use tampons, douche, or have sexual intercourse for 2 weeks or until your health care provider says it is okay.  Take your temperature twice a day for 4-5 days. Write it down each time.  Follow your health care provider's advice about diet, exercise, and lifting.  If you develop constipation, you may: ? Take a mild laxative if your health care provider approves. ? Add bran foods to your diet. ? Drink enough fluids to keep your urine clear or pale yellow.  Try to have someone with you or available to you for the first 24-48 hours, especially if you were given a general anesthetic.  Follow up with your health care provider as directed. Contact a health care provider if:  You feel dizzy or lightheaded.  You feel sick to your stomach (nauseous).  You have  abnormal vaginal discharge.  You have a rash.  You have pain that is not controlled with medicine. Get help right away if:  You have bleeding that is heavier than a normal menstrual period.  You have a fever.  You have increasing cramps or pain, not controlled with medicine.  You have new belly (abdominal) pain.  You pass out.  You have pain in the tops of your shoulders (shoulder strap areas).  You have shortness of breath. This information is not intended to replace advice given to you by your health care provider. Make sure you discuss any questions you have with your health care provider. Document Released: 10/06/2013 Document Revised: 05/23/2016 Document Reviewed: 07/15/2013 Elsevier Interactive Patient Education  2017 [place]. Endometrial Ablation Endometrial ablation is a procedure that destroys the thin inner layer of the lining of the uterus (endometrium). This procedure may be done:  To stop heavy periods.  To stop bleeding that is causing anemia.  To control irregular bleeding.  To treat bleeding caused by small tumors (fibroids) in the endometrium.  This procedure is often an alternative to major surgery, such as removal of the uterus and cervix (hysterectomy). As a result of this procedure:  You may not be able to have children. However, if you are premenopausal (you have not gone through menopause): ? You may still have a small chance of getting pregnant. ? You will need to use a reliable method of birth control after the procedure to prevent pregnancy.  You may stop having a  menstrual period, or you may have only a small amount of bleeding during your period. Menstruation may return several years after the procedure.  Tell a health care provider about:  Any allergies you have.  All medicines you are taking, including vitamins, herbs, eye drops, creams, and over-the-counter medicines.  Any problems you or family members have had with the use of  anesthetic medicines.  Any blood disorders you have.  Any surgeries you have had.  Any medical conditions you have. What are the risks? Generally, this is a safe procedure. However, problems may occur, including:  A hole (perforation) in the uterus or bowel.  Infection of the uterus, bladder, or vagina.  Bleeding.  Damage to other structures or organs.  An air bubble in the lung (air embolus).  Problems with pregnancy after the procedure.  Failure of the procedure.  Decreased ability to diagnose cancer in the endometrium.  What happens before the procedure?  You will have tests of your endometrium to make sure there are no pre-cancerous cells or cancer cells present.  You may have an ultrasound of the uterus.  You may be given medicines to thin the endometrium.  Ask your health care provider about: ? Changing or stopping your regular medicines. This is especially important if you take diabetes medicines or blood thinners. ? Taking medicines such as aspirin and ibuprofen. These medicines can thin your blood. Do not take these medicines before your procedure if your doctor tells you not to.  Plan to have someone take you home from the hospital or clinic. What happens during the procedure?  You will lie on an exam table with your feet and legs supported as in a pelvic exam.  To lower your risk of infection: ? Your health care team will wash or sanitize their hands and put on germ-free (sterile) gloves. ? Your genital area will be washed with soap.  An IV tube will be inserted into one of your veins.  You will be given a medicine to help you relax (sedative).  A surgical instrument with a light and camera (resectoscope) will be inserted into your vagina and moved into your uterus. This allows your surgeon to see inside your uterus.  Endometrial tissue will be removed using one of the following methods: ? Radiofrequency. This method uses a radiofrequency-alternating  electric current to remove the endometrium. ? Cryotherapy. This method uses extreme cold to freeze the endometrium. ? Heated-free liquid. This method uses a heated saltwater (saline) solution to remove the endometrium. ? Microwave. This method uses high-energy microwaves to heat up the endometrium and remove it. ? Thermal balloon. This method involves inserting a catheter with a balloon tip into the uterus. The balloon tip is filled with heated fluid to remove the endometrium. The procedure may vary among health care providers and hospitals. What happens after the procedure?  Your blood pressure, heart rate, breathing rate, and blood oxygen level will be monitored until the medicines you were given have worn off.  As tissue healing occurs, you may notice vaginal bleeding for 4-6 weeks after the procedure. You may also experience: ? Cramps. ? Thin, watery vaginal discharge that is light pink or brown in color. ? A need to urinate more frequently than usual. ? Nausea.  Do not drive for 24 hours if you were given a sedative.  Do not have sex or insert anything into your vagina until your health care provider approves. Summary  Endometrial ablation is done to treat the many causes  of heavy menstrual bleeding.  The procedure may be done only after medications have been tried to control the bleeding.  Plan to have someone take you home from the hospital or clinic. This information is not intended to replace advice given to you by your health care provider. Make sure you discuss any questions you have with your health care provider. Document Released: 10/25/2004 Document Revised: 01/02/2017 Document Reviewed: 01/02/2017 Elsevier Interactive Patient Education  2017 [place] Anesthesia Home Care Instructions  Activity: Get plenty of rest for the remainder of the day. A responsible individual must stay with you for 24 hours following the procedure.  For the next 24 hours, DO  NOT: -Drive a car -Paediatric nurse -Drink alcoholic beverages -Take any medication unless instructed by your physician -Make any legal decisions or sign important papers.  Meals: Start with liquid foods such as gelatin or soup. Progress to regular foods as tolerated. Avoid greasy, spicy, heavy foods. If nausea and/or vomiting occur, drink only clear liquids until the nausea and/or vomiting subsides. Call your physician if vomiting continues.  Special Instructions/Symptoms: Your throat may feel dry or sore from the anesthesia or the breathing tube placed in your throat during surgery. If this causes discomfort, gargle with warm salt water. The discomfort should disappear within 24 hours.  If you had a scopolamine patch placed behind your ear for the management of post- operative nausea and/or vomiting:  1. The medication in the patch is effective for 72 hours, after which it should be removed.  Wrap patch in a tissue and discard in the trash. Wash hands thoroughly with soap and water. 2. You may remove the patch earlier than 72 hours if you experience unpleasant side effects which may include dry mouth, dizziness or visual disturbances. 3. Avoid touching the patch. Wash your hands with soap and water after contact with the patch.

## 2018-05-13 NOTE — Op Note (Signed)
Operative Note  05/13/2018  11:48 AM  PATIENT:  Sheri Murphy  52 y.o. female  PRE-OPERATIVE DIAGNOSIS:  Menorrhagia, Fibroids  POST-OPERATIVE DIAGNOSIS:  Menorrhagia, Intramural Fibroids  PROCEDURE:  Procedure(s): HYSTEROSCOPY,  DILATATION & CURETTAGE, NOVASURE ENDOMETRIAL ABLATION  SURGEON:  Surgeon(s): [name], MD  ANESTHESIA:   general  FINDINGS: No submucosal component of Fibroids.  DESCRIPTION OF OPERATION: Under general anesthesia with laryngeal mask, the patient is in lithotomy position.  She is prepped with Betadine on the suprapubic, vulvar and vaginal areas.  The bladder is catheterized.  The patient is draped as usual.  Timeout is done.  The vaginal exam reveals an anteverted uterus, nodular, about 9 cm in diameter.  No adnexal mass.  The speculum is inserted in the vagina.  The anterior lip of the cervix is grasped with a tenaculum.  A paracervical block is done with Xylocaine 1% a total of 20 cc at 4 and 8:00.  The hysterometry is at 8 cm, with a cervical length of 4 cm, for a cavity length of 4 cm.  Dilation of the cervix with Pratt dilators up to #23 without difficulty.  Insertion of the hysteroscope in the intrauterine cavity and inspection of the cavity.  Both ostia are well seen.  No intra-uterine lesion is seen.  No submucosal component to the fibroids.  Pictures are taken.  The hysteroscope is then removed.  We proceed with a systematic curettage of the intrauterine cavity on all surfaces with a sharp curette.  The specimen is sent to pathology.  We then inserted the NovaSure instrument in the intrauterine cavity.  The width is measured at 4.4 cm.  The security test is passed.  We proceeded with endometrial ablation with the NovaSure instrument for duration of 1 minute and 49 seconds at a power of 97.  The NovaSure instrument is then removed.  The tenaculum is removed from the cervix.  Hemostasis is adequate.  The speculum is removed.  The patient is brought to  recovery room in good and stable status.  ESTIMATED BLOOD LOSS: 10 CC FLUID DEFICIT 90 CC  Intake/Output Summary (Last 24 hours) at 05/13/2018 1148 Last data filed at 05/13/2018 1140 Gross per 24 hour  Intake 500 ml  Output 50 ml  Net 450 ml     BLOOD ADMINISTERED:none   LOCAL MEDICATIONS USED:  XYLOCAINE 1% 20 cc for a Paracervical block  SPECIMEN:  Source of Specimen:  Endometrial curettings  DISPOSITION OF SPECIMEN:  PATHOLOGY  COUNTS:  YES  PLAN OF CARE: Transfer to PACU  Marie-Lyne LavoieMD11:48 AM

## 2018-05-13 NOTE — Anesthesia Preprocedure Evaluation (Signed)
Anesthesia Evaluation  Patient identified by MRN, date of birth, ID band Patient awake    Reviewed: Allergy & Precautions, H&P , NPO status , Patient's Chart, lab work & pertinent test results  Airway Mallampati: II   Neck ROM: full    Dental   Pulmonary    breath sounds clear to auscultation       Cardiovascular hypertension,  Rhythm:regular Rate:Normal     Neuro/Psych PSYCHIATRIC DISORDERS Anxiety  Neuromuscular disease    GI/Hepatic GERD  ,  Endo/Other  diabetes, Type 2  Renal/GU Renal diseaseH/o renal cell CA s/p resection     Musculoskeletal  (+) Fibromyalgia -  Abdominal   Peds  Hematology   Anesthesia Other Findings   Reproductive/Obstetrics                             Anesthesia Physical Anesthesia Plan  ASA: II  Anesthesia Plan: General   Post-op Pain Management:    Induction: Intravenous  PONV Risk Score and Plan: 3 and Ondansetron, Dexamethasone, Midazolam and Treatment may vary due to age or medical condition  Airway Management Planned: LMA  Additional Equipment:   Intra-op Plan:   Post-operative Plan:   Informed Consent: I have reviewed the patients History and Physical, chart, labs and discussed the procedure including the risks, benefits and alternatives for the proposed anesthesia with the patient or authorized representative who has indicated his/her understanding and acceptance.     Plan Discussed with: CRNA, Anesthesiologist and Surgeon  Anesthesia Plan Comments:         Anesthesia Quick Evaluation

## 2018-05-13 NOTE — Transfer of Care (Signed)
  Last Vitals:  Vitals Value Taken Time  BP 155/97 05/13/2018 12:00 PM  Temp 36.3 C 05/13/2018 12:00 PM  Pulse 50 05/13/2018 12:00 PM  Resp 14 05/13/2018 12:00 PM  SpO2 93 % 05/13/2018 12:00 PM    Last Pain:  Vitals:   05/13/18 0924  TempSrc: Oral      Patients Stated Pain Goal: 3 (05/13/18 1009)  Immediate Anesthesia Transfer of Care Note  Patient: Sheri Murphy  Procedure(s) Performed: Procedure(s) (LRB): DILATATION & CURETTAGE/HYSTEROSCOPY WITH NOVASURE (N/A)  Patient Location: PACU  Anesthesia Type: General  Level of Consciousness: awake, alert  and oriented  Airway & Oxygen Therapy: Patient Spontanous Breathing and Patient connected to nasal cannula oxygen  Post-op Assessment: Report given to PACU RN and Post -op Vital signs reviewed and stable  Post vital signs: Reviewed and stable  Complications: No apparent anesthesia complications

## 2018-05-13 NOTE — H&P (Addendum)
Sheri Murphy is an 52 y.o. female. [id number]  RP: Refractory Menorrhagia with uterine fibroids for HSC/Excision of possible SM Myomas, D+C, Novasure Endometrial Ablation  HPI: Heavy menstrual flow.  Uterine fibroids per ultrasound June 2018.  Has tried progesterone IUD, Depo-Provera and the progestin pill without success previously.  Pertinent Gynecological History: Menses: Heavy Contraception: condoms Blood transfusions: none Sexually transmitted diseases: no past history Last mammogram: normal  Last pap: normal   H/O D+E without Cx.  H/O Cryotherapy of Cervix.  Menstrual History:  Patient's last menstrual period was 04/19/2018 (exact date).    Past Medical History:  Diagnosis Date  . Abnormal liver function 01/08/2016  . Anemia   . Anxiety   . Controlled type 2 diabetes mellitus without complication ([place])   . Fatty liver   . Fibroids   . Fibromyalgia   . GERD (gastroesophageal reflux disease)   . Heart murmur   . Hematuria   . Hypertension   . IBS (irritable bowel syndrome)   . Insomnia   . Menorrhagia   . Obesity   . Renal cell carcinoma, left (H[facility])    surgery only chemo/radiation not required    Past Surgical History:  Procedure Laterality Date  . BREAST BIOPSY Right   . COLONOSCOPY    . Left renal mass removal  07/2017   Thermal Microwave Ablation  . SKIN LESION EXCISION    . WISDOM TOOTH EXTRACTION      History reviewed. No pertinent family history.  Social History:  reports that she has never smoked. She has never used smokeless tobacco. She reports that she drinks alcohol. She reports that she does not use drugs.  Allergies: No Known Allergies  Medications Prior to Admission  Medication Sig Dispense Refill Last Dose  . acetaminophen (TYLENOL) 500 MG tablet Take 500 mg by mouth every 6 (six) hours as needed for mild pain.     . cetirizine (ZYRTEC) 5 MG tablet Take 5 mg by mouth daily.     Marland Kitchen omeprazole (PRILOSEC) 40 MG capsule Take 40 mg by mouth  daily.     . bisoprolol-hydrochlorothiazide (ZIAC) 10-6.25 MG tablet Take 1 tablet by mouth daily.   Taking  . cholecalciferol (VITAMIN D) 1000 units tablet Take 1,000 Units by mouth daily.   Taking  . diphenhydrAMINE (BENADRYL) 25 mg capsule Take 25 mg by mouth every 6 (six) hours as needed.   Taking  . ferrous sulfate 325 (65 FE) MG EC tablet Take 325 mg by mouth 3 (three) times daily with meals.   Taking  . ibuprofen (ADVIL,MOTRIN) 600 MG tablet Take 1 tablet (600 mg total) by mouth every 8 (eight) hours as needed. 60 tablet 1 Taking  . metFORMIN (GLUCOPHAGE) 500 MG tablet Take by mouth 2 (two) times daily with a meal.   Taking  . Probiotic Product (PROBIOTIC-10 PO) Take by mouth.   Taking    REVIEW OF SYSTEMS: A ROS was performed and pertinent positives and negatives are included in the history.  GENERAL: No fevers or chills. HEENT: No change in vision, no earache, sore throat or sinus congestion. NECK: No pain or stiffness. CARDIOVASCULAR: No chest pain or pressure. No palpitations. PULMONARY: No shortness of breath, cough or wheeze. GASTROINTESTINAL: No abdominal pain, nausea, vomiting or diarrhea, melena or bright red blood per rectum. GENITOURINARY: No urinary frequency, urgency, hesitancy or dysuria. MUSCULOSKELETAL: No joint or muscle pain, no back pain, no recent trauma. DERMATOLOGIC: No rash, no itching, no lesions. ENDOCRINE: No polyuria, polydipsia, no  heat or cold intolerance. No recent change in weight. HEMATOLOGICAL: No anemia or easy bruising or bleeding. NEUROLOGIC: No headache, seizures, numbness, tingling or weakness. PSYCHIATRIC: No depression, no loss of interest in normal activity or change in sleep pattern.     Blood pressure (!) 168/102, pulse 77, temperature 98.5 F (36.9 C), temperature source Oral, resp. rate 16, height 5\' 5"  (1.651 m), weight 185 lb 14.4 oz (84.3 kg), last menstrual period 04/19/2018, SpO2 99 %.  Physical Exam:  See office notes  Pelvic US  04/08/2018: T/V and T/A images.  Anteverted uterus measuring 9.49 x 6.82 x 6.58 cm.  Intramural fibroids measuring 2 x 1.6, 2.3 x 1.6, 3.2 x 2.7, 3.3 x 3.1, 1.6 x 1.5, 2.1 x 2.2, 1.7 x 2.0 cm.  On the transvaginal images the endometrium is displaced by some fibroids.  Endometrial lining tri-layered at 10.1 mm.  Right ovary normal.  Left ovary normal with a dominant follicle measured at 1.9 cm.  Right and left adnexa negative.  No free fluid in the posterior cul-de-sac.   Assessment/Plan:  52 y.o. No obstetric history on file.   1. Menorrhagia with regular cycle Unsuccessful attempt to control with the progesterone IUD, Depo-Provera and progestin pills.  Patient interested in endometrial ablation.  Patient informed that given the possibility of submucosal myomas, we would proceed with a hysteroscopy and excision of the fibroids as needed, D+C and Novasure Endometrial Ablation.  2. Fibroids Some fibroids are partially submucosal as the endometrium is displaced.                        Patient was counseled as to the risk of surgery to include the following:  1. Infection (prohylactic antibiotics will be administered)  2. DVT/Pulmonary Embolism (prophylactic pneumo compression stockings will be used)  3.Trauma to internal organs requiring additional surgical procedure to repair any injury to internal organs requiring perhaps additional hospitalization days.  4.Hemmorhage requiring transfusion and blood products which carry risks such as   anaphylactic reaction, hepatitis and AIDS  Patient had received literature information on the procedure scheduled and all her questions were answered and fully accepts all risks.  Marie-Lyne Chandria Rookstool 05/13/2018, 9:39 AM

## 2018-05-14 ENCOUNTER — Encounter (HOSPITAL_BASED_OUTPATIENT_CLINIC_OR_DEPARTMENT_OTHER): Payer: Self-pay | Admitting: Obstetrics & Gynecology

## 2018-05-14 ENCOUNTER — Encounter: Payer: Managed Care, Other (non HMO) | Admitting: Obstetrics & Gynecology

## 2018-05-14 NOTE — Anesthesia Postprocedure Evaluation (Signed)
Anesthesia Post Note  Patient: Sheri Murphy  Procedure(s) Performed: DILATATION & CURETTAGE/HYSTEROSCOPY WITH NOVASURE (N/A Uterus)     Patient location during evaluation: PACU Anesthesia Type: General Level of consciousness: awake and alert Pain management: pain level controlled Vital Signs Assessment: post-procedure vital signs reviewed and stable Respiratory status: spontaneous breathing, nonlabored ventilation, respiratory function stable and patient connected to nasal cannula oxygen Cardiovascular status: blood pressure returned to baseline and stable Postop Assessment: no apparent nausea or vomiting Anesthetic complications: no    Last Vitals:  Vitals:   05/13/18 1357 05/13/18 1430  BP: (!) 172/96 (!) 160/91  Pulse: 82 85  Resp: 20 16  Temp: 37 C 36.9 C  SpO2: 100% 99%    Last Pain:  Vitals:   05/13/18 1605  TempSrc:   PainSc: 0-No pain                 Jayliana Valencia S

## 2018-05-15 DIAGNOSIS — G8929 Other chronic pain: Secondary | ICD-10-CM | POA: Insufficient documentation

## 2018-05-19 ENCOUNTER — Other Ambulatory Visit: Payer: Self-pay | Admitting: Obstetrics & Gynecology

## 2018-05-19 ENCOUNTER — Telehealth: Payer: Self-pay

## 2018-05-19 MED ORDER — TERCONAZOLE 0.8 % VA CREA: TOPICAL_CREAM | VAGINAL | 0 refills | Status: DC

## 2018-05-19 NOTE — Telephone Encounter (Signed)
Patient called in voice mail stating she had surgery last week and is having some "itching" she wants to discuss with me.  I called her back and left voice mail to call me back.

## 2018-05-19 NOTE — Telephone Encounter (Signed)
Please send Terazol 3 Intravaginal and vulvar/periurethral application HS x 3.  If no improvement, schedule with me before the week-end to evaluate.

## 2018-05-19 NOTE — Telephone Encounter (Signed)
Patient advised. Rx sent. 

## 2018-05-19 NOTE — Telephone Encounter (Signed)
Had surgery last week on 05/13/18.  Patient called back and left detailed message. She said she has "irritation around urethra and external vaginal area". She said "it feels like a yeast infection from front to back.".  She said she took a Diflucan on Saturday and it helped a little but certainly did not take care of it.  What to rec?

## 2018-05-29 ENCOUNTER — Other Ambulatory Visit: Payer: Self-pay | Admitting: Women's Health

## 2018-05-29 NOTE — Telephone Encounter (Signed)
CE is scheduled 06/05/18 with Dr. Marguerita Merles.

## 2018-06-05 ENCOUNTER — Encounter: Payer: Self-pay | Admitting: Obstetrics & Gynecology

## 2018-06-05 ENCOUNTER — Ambulatory Visit: Payer: Managed Care, Other (non HMO) | Admitting: Obstetrics & Gynecology

## 2018-06-05 ENCOUNTER — Encounter: Payer: Managed Care, Other (non HMO) | Admitting: Obstetrics & Gynecology

## 2018-06-05 VITALS — BP 126/84 | Ht 65.0 in | Wt 187.0 lb

## 2018-06-05 DIAGNOSIS — N760 Acute vaginitis: Secondary | ICD-10-CM | POA: Diagnosis not present

## 2018-06-05 DIAGNOSIS — Z1151 Encounter for screening for human papillomavirus (HPV): Secondary | ICD-10-CM

## 2018-06-05 DIAGNOSIS — Z789 Other specified health status: Secondary | ICD-10-CM

## 2018-06-05 DIAGNOSIS — Z9889 Other specified postprocedural states: Secondary | ICD-10-CM | POA: Diagnosis not present

## 2018-06-05 DIAGNOSIS — Z01419 Encounter for gynecological examination (general) (routine) without abnormal findings: Secondary | ICD-10-CM | POA: Diagnosis not present

## 2018-06-05 DIAGNOSIS — B9689 Other specified bacterial agents as the cause of diseases classified elsewhere: Secondary | ICD-10-CM | POA: Diagnosis not present

## 2018-06-05 DIAGNOSIS — N898 Other specified noninflammatory disorders of vagina: Secondary | ICD-10-CM

## 2018-06-05 LAB — WET PREP FOR TRICH, YEAST, CLUE

## 2018-06-05 MED ORDER — TINIDAZOLE 500 MG PO TABS: 2.0000 g | ORAL_TABLET | Freq: Every day | ORAL | 0 refills | Status: AC

## 2018-06-05 NOTE — Progress Notes (Signed)
Sheri Murphy [birth year] [id number]   History:    52 y.o. G3P0A3 Single  RP:  Established patient presenting for annual gyn exam   HPI: 3 wks post hysteroscopy, excision of endometrial polyp, D&C, NovaSure endometrial ablation.  Doing very well postop with no vaginal bleeding.  Mild vaginal discharge is present.  No pelvic pain.  No fever.  Not currently sexually active.  Urine and bowel movements normal.  Breasts normal.  Body mass index 31.12.  Patient had a colonoscopy that was normal in 2016 given mother's history of Colon cancer.  Health labs with family physician.  Past medical history,surgical history, family history and social history were all reviewed and documented in the EPIC chart.  Gynecologic History Patient's last menstrual period was 05/15/2018. Contraception: abstinence currently.  Will use condoms. Last Pap: 2018. Results were: normal per patient Last mammogram: 12/2017. Results were: Negative Bone Density: Never Colonoscopy: 2016, every 5 yrs.  Mother with Colon Ca.  Obstetric History OB History  Gravida Para Term Preterm AB Living  3 0     2 0  SAB TAB Ectopic Multiple Live Births  1            # Outcome Date GA Lbr Len/2nd Weight Sex Delivery Anes PTL Lv  3 Gravida           2 AB           1 SAB              ROS: A ROS was performed and pertinent positives and negatives are included in the history.  GENERAL: No fevers or chills. HEENT: No change in vision, no earache, sore throat or sinus congestion. NECK: No pain or stiffness. CARDIOVASCULAR: No chest pain or pressure. No palpitations. PULMONARY: No shortness of breath, cough or wheeze. GASTROINTESTINAL: No abdominal pain, nausea, vomiting or diarrhea, melena or bright red blood per rectum. GENITOURINARY: No urinary frequency, urgency, hesitancy or dysuria. MUSCULOSKELETAL: No joint or muscle pain, no back pain, no recent trauma. DERMATOLOGIC: No rash, no itching, no lesions. ENDOCRINE: No polyuria,  polydipsia, no heat or cold intolerance. No recent change in weight. HEMATOLOGICAL: No anemia or easy bruising or bleeding. NEUROLOGIC: No headache, seizures, numbness, tingling or weakness. PSYCHIATRIC: No depression, no loss of interest in normal activity or change in sleep pattern.     Exam:   BP 126/84   Ht 5\' 5"  (1.651 m)   Wt 187 lb (84.8 kg)   LMP 05/15/2018   BMI 31.12 kg/m   Body mass index is 31.12 kg/m.  General appearance : Well developed well nourished female. No acute distress HEENT: Eyes: no retinal hemorrhage or exudates,  Neck supple, trachea midline, no carotid bruits, no thyroidmegaly Lungs: Clear to auscultation, no rhonchi or wheezes, or rib retractions  Heart: Regular rate and rhythm, no murmurs or gallops Breast:Examined in sitting and supine position were symmetrical in appearance, no palpable masses or tenderness,  no skin retraction, no nipple inversion, no nipple discharge, no skin discoloration, no axillary or supraclavicular lymphadenopathy Abdomen: no palpable masses or tenderness, no rebound or guarding Extremities: no edema or skin discoloration or tenderness  Pelvic: Vulva: Normal             Vagina: No gross lesions or discharge  Cervix: No gross lesions or discharge.  Pap/HR HPV done.  Wet prep done.  Uterus  AV, normal size, shape and consistency, non-tender and mobile  Adnexa  Without masses or tenderness  Anus:  Normal.  Rectal exam negative.  Hemocards x 3 given.  Wet prep: Clue cells present   Assessment/Plan:  52 y.o. female for annual exam   1. Encounter for routine gynecological examination with Papanicolaou smear of cervix Normal gynecologic exam.  Pap with high-risk HPV done today.  Breast exam normal.  Last screening mammogram in January 2019 was negative.  Will repeat colonoscopy at 7 years in 2021 given mother's history of colon cancer.  Health labs with family physician.  Will use condoms if sexually active.  2. Relies on  partner's use of condoms as primary birth control method Currently abstinent.  3. S/P endometrial ablation HSC/Excision/D+C/Novasure Endometrial Ablation 05/13/2018.  Very good postop healing.  No complication.  Pathology benign.  4. Vaginal discharge Wet prep done showing clue cells.  5. Bacterial vaginosis Bacterial vaginosis present.  Diagnosis and management reviewed.  Tinidazole prescribed and usage reviewed.  Other orders - tinidazole (TINDAMAX) 500 MG tablet; Take 4 tablets (2,000 mg total) by mouth daily for 2 days.  Counseling on above issues and coordination of care more than 50% for 10 minutes.  [name] MD, 1:22 PM 06/05/2018

## 2018-06-05 NOTE — Patient Instructions (Signed)
1. Encounter for routine gynecological examination with Papanicolaou smear of cervix Normal gynecologic exam.  Pap with high-risk HPV done today.  Breast exam normal.  Last screening mammogram in January 2019 was negative.  Will repeat colonoscopy at 7 years in 2021 given mother's history of colon cancer.  Health labs with family physician.  Will use condoms if sexually active.  2. Relies on partner's use of condoms as primary birth control method Currently abstinent.  3. S/P endometrial ablation HSC/Excision/D+C/Novasure Endometrial Ablation 05/13/2018.  Very good postop healing.  No complication.  Pathology benign.  4. Vaginal discharge Wet prep done showing clue cells.  5. Bacterial vaginosis Bacterial vaginosis present.  Diagnosis and management reviewed.  Tinidazole prescribed and usage reviewed.  Other orders - tinidazole (TINDAMAX) 500 MG tablet; Take 4 tablets (2,000 mg total) by mouth daily for 2 days.  Da, it was a pleasure seeing you today!  I will inform you of your results as soon as they are available.

## 2018-06-08 LAB — PAP, TP IMAGING W/ HPV RNA, RFLX HPV TYPE 16,18/45: HPV DNA High Risk: NOT DETECTED

## 2018-06-26 ENCOUNTER — Ambulatory Visit: Payer: Managed Care, Other (non HMO) | Admitting: Obstetrics & Gynecology

## 2018-06-26 ENCOUNTER — Encounter: Payer: Self-pay | Admitting: Obstetrics & Gynecology

## 2018-06-26 VITALS — BP 130/84

## 2018-06-26 DIAGNOSIS — N898 Other specified noninflammatory disorders of vagina: Secondary | ICD-10-CM | POA: Diagnosis not present

## 2018-06-26 LAB — WET PREP FOR TRICH, YEAST, CLUE

## 2018-06-26 MED ORDER — FLUCONAZOLE 150 MG PO TABS: 150.0000 mg | ORAL_TABLET | Freq: Every day | ORAL | 0 refills | Status: DC

## 2018-06-26 MED ORDER — METRONIDAZOLE 500 MG PO TABS: 500.0000 mg | ORAL_TABLET | Freq: Two times a day (BID) | ORAL | 0 refills | Status: AC

## 2018-06-26 NOTE — Progress Notes (Signed)
    Sheri Murphy 1966-03-04 [id number]        52 y.o.  G3P0020   RP: Vaginal discharge with odor  HPI: Recurrent BVs.  6 weeks postop hysteroscopy with polyp excision, D&C and NovaSure endometrial ablation.  No abnormal bleeding.  No pelvic pain.  Vaginal discharge with odor again.  No fever.  Patient is abstinent.   OB History  Gravida Para Term Preterm AB Living  3 0     2 0  SAB TAB Ectopic Multiple Live Births  1            # Outcome Date GA Lbr Len/2nd Weight Sex Delivery Anes PTL Lv  3 Gravida           2 AB           1 SAB             Past medical history,surgical history, problem list, medications, allergies, family history and social history were all reviewed and documented in the EPIC chart.   Directed ROS with pertinent positives and negatives documented in the history of present illness/assessment and plan.  Exam:  Vitals:   06/26/18 1042  BP: 130/84   General appearance:  Normal  Abdomen: Normal  Gynecologic exam: Vulva normal.  Speculum: Cervix and vagina normal.  Increased vaginal discharge with odor.  Wet prep done.  Declines STD screening otherwise.  Wet prep:  Clue cells present   Assessment/Plan:  52 y.o. G3P0020   1. Vaginal discharge Confirmed bacterial vaginosis with positive wet prep.  Recurrent bacterial vaginosis treated recently with tinidazole.  Decision to treat with metronidazole 500 mg 1 tablet p.o. twice a day for 7 days.  Usage, risks and benefits reviewed.  Prescription sent to pharmacy.  Fluconazole 1 tablet p.o. daily for 3 days also prescribed to prevent or treat post antibiotic yeast vaginitis.  Probiotic tablets/suppositories vaginally weekly as needed. - WET PREP FOR [name], YEAST, CLUE  Other orders - metroNIDAZOLE (FLAGYL) 500 MG tablet; Take 1 tablet (500 mg total) by mouth 2 (two) times daily for 7 days. - fluconazole (DIFLUCAN) 150 MG tablet; Take 1 tablet (150 mg total) by mouth daily for 3 days.  Counseling on above  issues and coordination of care more than 50% of 15 minutes.  [name] MD, 11:16 AM 06/26/2018

## 2018-06-28 ENCOUNTER — Encounter: Payer: Self-pay | Admitting: Obstetrics & Gynecology

## 2018-06-28 NOTE — Patient Instructions (Signed)
1. Vaginal discharge Confirmed bacterial vaginosis with positive wet prep.  Recurrent bacterial vaginosis treated recently with tinidazole.  Decision to treat with metronidazole 500 mg 1 tablet p.o. twice a day for 7 days.  Usage, risks and benefits reviewed.  Prescription sent to pharmacy.  Fluconazole 1 tablet p.o. daily for 3 days also prescribed to prevent or treat post antibiotic yeast vaginitis.  Probiotic tablets/suppositories vaginally weekly as needed. - WET PREP FOR Sheri Murphy, YEAST, CLUE  Other orders - metroNIDAZOLE (FLAGYL) 500 MG tablet; Take 1 tablet (500 mg total) by mouth 2 (two) times daily for 7 days. - fluconazole (DIFLUCAN) 150 MG tablet; Take 1 tablet (150 mg total) by mouth daily for 3 days.  Sheri Murphy, it was a pleasure seeing you today!

## 2018-07-14 ENCOUNTER — Other Ambulatory Visit: Payer: Self-pay | Admitting: Obstetrics & Gynecology

## 2018-07-14 NOTE — Telephone Encounter (Signed)
Was prescribed 05/29/18 for #60 w/no refill.

## 2018-07-22 ENCOUNTER — Telehealth: Payer: Self-pay | Admitting: *Deleted

## 2018-07-22 NOTE — Telephone Encounter (Signed)
Patient called c/o recurrent BV infection was treated on 06/26/18 with  metronidazole 500 mg 1 tablet p.o. twice a day for 7 days. asked if another Rx can be sent since this has been a recurrent issue? Please advise

## 2018-07-23 MED ORDER — METRONIDAZOLE 500 MG PO TABS: 500.0000 mg | ORAL_TABLET | Freq: Two times a day (BID) | ORAL | 0 refills | Status: DC

## 2018-07-23 NOTE — Telephone Encounter (Signed)
Can send Metronidazole 500 mg BID x 7 days, but no refill.  If gets another episode after that, recommend a visit with a Wet Prep again.

## 2018-07-23 NOTE — Telephone Encounter (Signed)
Left on voicemail Rx sent 

## 2018-10-12 ENCOUNTER — Other Ambulatory Visit: Payer: Self-pay | Admitting: Obstetrics & Gynecology

## 2018-10-13 ENCOUNTER — Ambulatory Visit: Payer: Managed Care, Other (non HMO) | Admitting: Women's Health

## 2018-10-13 ENCOUNTER — Encounter: Payer: Self-pay | Admitting: Women's Health

## 2018-10-13 VITALS — BP 136/90 | Wt 186.0 lb

## 2018-10-13 DIAGNOSIS — Z113 Encounter for screening for infections with a predominantly sexual mode of transmission: Secondary | ICD-10-CM

## 2018-10-13 DIAGNOSIS — N898 Other specified noninflammatory disorders of vagina: Secondary | ICD-10-CM

## 2018-10-13 DIAGNOSIS — B373 Candidiasis of vulva and vagina: Secondary | ICD-10-CM

## 2018-10-13 DIAGNOSIS — B3731 Acute candidiasis of vulva and vagina: Secondary | ICD-10-CM

## 2018-10-13 LAB — WET PREP FOR TRICH, YEAST, CLUE

## 2018-10-13 MED ORDER — FLUCONAZOLE 150 MG PO TABS: ORAL_TABLET | ORAL | 0 refills | Status: DC

## 2018-10-13 MED ORDER — FLUCONAZOLE 150 MG PO TABS: 150.0000 mg | ORAL_TABLET | Freq: Once | ORAL | 0 refills | Status: DC

## 2018-10-13 NOTE — Patient Instructions (Signed)

## 2018-10-13 NOTE — Progress Notes (Addendum)
Sheri Murphy October 05, 1966 [id number]  Presents with complaint of vaginal itching with discharge for the past 3 days.  Denies urinary symptoms, abdominal / back pain or fever.  04/2018 hysteroscopy for benign endometrial polyp had a endometrial ablation has been amenorrheic since.  Partner in the last 2 months.  No recent antibiotics.  Primary care manages hypertension, diabetes, IBS.  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    Vitals:   10/13/18 1238  BP: 136/90  Weight: 186 lb (84.4 kg)   Body mass index is 30.95 kg/m.   Exam: Appears well.  No CVAT.  Abdomen soft, obese, external genitalia edematous at introitus, speculum exam moderate amount of white curdy discharge noted, wet prep positive for yeast.  Bimanual uterus enlarged nontender, no adnexal tenderness.  GC/chlamydia culture taken.  Yeast vaginitis 04/2018 amenorrheic since endometrial ablation  Plan: Diflucan 150 p.o. 3 days x 3 doses.  States has trouble with yeast treatment and normally needs 3 doses.  Reviewed importance of low-carb/sugar diet.  GC/Chlamydia pending, HIV, hep B, C, RPR.  Condoms encouraged until permanent partner.        [place], 1:23 PM 10/13/2018

## 2018-10-14 LAB — C. TRACHOMATIS/N. GONORRHOEAE RNA
C. trachomatis RNA, TMA: NOT DETECTED
N. gonorrhoeae RNA, TMA: NOT DETECTED

## 2018-10-14 LAB — HEPATITIS C ANTIBODY
Hepatitis C Ab: NONREACTIVE
SIGNAL TO CUT-OFF: 0.01 (ref <1.00)

## 2018-10-14 LAB — HEPATITIS B SURFACE ANTIGEN: Hepatitis B Surface Ag: NONREACTIVE

## 2018-10-14 LAB — RPR: RPR Ser Ql: NONREACTIVE

## 2018-10-14 LAB — HIV ANTIBODY (ROUTINE TESTING W REFLEX): HIV 1&2 Ab, 4th Generation: NONREACTIVE

## 2018-10-21 ENCOUNTER — Encounter: Payer: Self-pay | Admitting: Obstetrics & Gynecology

## 2018-10-21 ENCOUNTER — Ambulatory Visit: Payer: Managed Care, Other (non HMO) | Admitting: Obstetrics & Gynecology

## 2018-10-21 VITALS — BP 126/84

## 2018-10-21 DIAGNOSIS — M899 Disorder of bone, unspecified: Secondary | ICD-10-CM

## 2018-10-21 DIAGNOSIS — N898 Other specified noninflammatory disorders of vagina: Secondary | ICD-10-CM

## 2018-10-21 DIAGNOSIS — R300 Dysuria: Secondary | ICD-10-CM | POA: Diagnosis not present

## 2018-10-21 LAB — WET PREP FOR TRICH, YEAST, CLUE

## 2018-10-21 MED ORDER — FLUCONAZOLE 150 MG PO TABS: 150.0000 mg | ORAL_TABLET | Freq: Every day | ORAL | 2 refills | Status: AC

## 2018-10-21 MED ORDER — TINIDAZOLE 500 MG PO TABS: 2.0000 g | ORAL_TABLET | Freq: Every day | ORAL | 0 refills | Status: AC

## 2018-10-21 NOTE — Progress Notes (Signed)
    Sheri Murphy 1966-08-24 [id number]        52 y.o.  [id number] Single  RP: Left pubic pain x 2 days and vaginal d/c with itching  HPI:  Recently 10/15th treated for BV/Yeast with Flagyl/Terazol, but finished with Flagyl.  Recurrence of a vaginal discharge with itching.  No menstrual periods post Endometrial Ablation. C/O mild burning with mictions, no frequency, no blood, no fever.  C/O tenderness at the left pubic bone on-off x 2 weeks.   OB History  Gravida Para Term Preterm AB Living  3 0     2 0  SAB TAB Ectopic Multiple Live Births  1            # Outcome Date GA Lbr Len/2nd Weight Sex Delivery Anes PTL Lv  3 Gravida           2 AB           1 SAB             Past medical history,surgical history, problem list, medications, allergies, family history and social history were all reviewed and documented in the EPIC chart.   Directed ROS with pertinent positives and negatives documented in the history of present illness/assessment and plan.  Exam:  Vitals:   10/21/18 0813  BP: 126/84   General appearance:  Normal  CVAT negative bilaterally  Abdomen: Normal  Gynecologic exam: Vulva normal.  Speculum:  Cervix/Vagina normal.  Mild increase in d/c.  Wet prep done.  Bimanual exam:  Uterus AV, normal volume, NT, mobile.  No adnexal cyst/mass, NT bilaterally.  Wet prep: Clue cells present  U/A: Yellow, clear, Nitrites negative, WBC 0-5, RBC 20-40, many bacteria, moderate clue cells seen.  Pending U. Culture.   Assessment/Plan:  52 y.o. G3P0020   1. Pubic bone pain Tender at the pubic bone on the left.  Musculo-skeletic pain, probable inflammation.  Will take Ibuprofen 600 mg PO every 6 hours x 48 hours.  If no improvement, recommend evaluation with Fam MD/Orthopedist.  2. Vaginal itching Bacterial vaginosis confirmed by wet prep.  Will treat with tinidazole.  Usage reviewed with patient and prescription sent to pharmacy.  Fluconazole sent to treat or prevent yeast  vaginitis after antibiotics. - WET PREP FOR [place], YEAST, CLUE  3. Dysuria Probably no acute cystitis per urine analysis.  Will wait on urine culture to decide if treatment is needed. - Urinalysis,Complete w/RFL Culture  Other orders - fluconazole (DIFLUCAN) 150 MG tablet; Take 1 tablet (150 mg total) by mouth daily for 3 days. - tinidazole (TINDAMAX) 500 MG tablet; Take 4 tablets (2,000 mg total) by mouth daily for 2 days.  Counseling on above issues and coordination of care more than 50% for 25 minutes.  [name] MD, 8:21 AM 10/21/2018

## 2018-10-23 ENCOUNTER — Encounter: Payer: Self-pay | Admitting: *Deleted

## 2018-10-23 LAB — URINALYSIS, COMPLETE W/RFL CULTURE
Bilirubin Urine: NEGATIVE
Glucose, UA: NEGATIVE
Hyaline Cast: NONE SEEN /LPF
Ketones, ur: NEGATIVE
Leukocyte Esterase: NEGATIVE
Nitrites, Initial: NEGATIVE
Protein, ur: NEGATIVE
Specific Gravity, Urine: 1.015 (ref 1.001–1.03)
pH: 5.5 (ref 5.0–8.0)

## 2018-10-23 LAB — URINE CULTURE
MICRO NUMBER:: 91280331
Result:: NO GROWTH
SPECIMEN QUALITY:: ADEQUATE

## 2018-10-23 LAB — CULTURE INDICATED

## 2018-10-25 ENCOUNTER — Encounter: Payer: Self-pay | Admitting: Obstetrics & Gynecology

## 2018-10-25 NOTE — Patient Instructions (Signed)
1. Pubic bone pain Tender at the pubic bone on the left.  Musculo-skeletic pain, probable inflammation.  Will take Ibuprofen 600 mg PO every 6 hours x 48 hours.  If no improvement, recommend evaluation with Fam MD/Orthopedist.  2. Vaginal itching Bacterial vaginosis confirmed by wet prep.  Will treat with tinidazole.  Usage reviewed with patient and prescription sent to pharmacy.  Fluconazole sent to treat or prevent yeast vaginitis after antibiotics. - WET PREP FOR [place], YEAST, CLUE  3. Dysuria Probably no acute cystitis per urine analysis.  Will wait on urine culture to decide if treatment is needed. - Urinalysis,Complete w/RFL Culture  Other orders - fluconazole (DIFLUCAN) 150 MG tablet; Take 1 tablet (150 mg total) by mouth daily for 3 days. - tinidazole (TINDAMAX) 500 MG tablet; Take 4 tablets (2,000 mg total) by mouth daily for 2 days.  Sheri Murphy, it was a pleasure seeing you today!  I will inform you of your results as soon as they are available.

## 2018-11-09 ENCOUNTER — Telehealth: Payer: Self-pay | Admitting: *Deleted

## 2018-11-09 NOTE — Telephone Encounter (Signed)
Patient called c/o recurrent BV infection, finished tinidazole and diflucan tablet afterwards. Called today reports symptoms are starting back with foul odor and milky discharge. She asked if another Rx should be provided? Then try OTC probiotic to help long term? Any recommendations would be helpful.  Please advise

## 2018-11-10 MED ORDER — METRONIDAZOLE 0.75 % VA GEL: 1.0000 | Freq: Every day | VAGINAL | 0 refills | Status: DC

## 2018-11-10 NOTE — Telephone Encounter (Signed)
Detailed message on cell per DPR access, Rx sent.  

## 2018-11-10 NOTE — Telephone Encounter (Signed)
Let's retreat but with Metrogel x 5 days.  Probiotic then.

## 2018-12-03 DIAGNOSIS — M5416 Radiculopathy, lumbar region: Secondary | ICD-10-CM | POA: Insufficient documentation

## 2018-12-03 DIAGNOSIS — R1012 Left upper quadrant pain: Secondary | ICD-10-CM | POA: Insufficient documentation

## 2018-12-21 ENCOUNTER — Other Ambulatory Visit: Payer: Self-pay | Admitting: Family Medicine

## 2018-12-21 ENCOUNTER — Ambulatory Visit
Admission: RE | Admit: 2018-12-21 | Discharge: 2018-12-21 | Disposition: A | Discharge status: Home / Self Care | Payer: Managed Care, Other (non HMO) | Source: Ambulatory Visit | Attending: Family Medicine | Admitting: Family Medicine

## 2018-12-21 DIAGNOSIS — M5416 Radiculopathy, lumbar region: Secondary | ICD-10-CM

## 2018-12-31 DIAGNOSIS — M6701 Short Achilles tendon (acquired), right ankle: Secondary | ICD-10-CM

## 2018-12-31 HISTORY — DX: Short Achilles tendon (acquired), right ankle: M67.01

## 2019-01-25 ENCOUNTER — Telehealth: Payer: Self-pay | Admitting: *Deleted

## 2019-01-25 NOTE — Telephone Encounter (Signed)
Patient took antibiotic for sinus infection prescribed by another physician, now has yeast infection asked if you would be willing to prescribed Terazol cream to help with itching/discharge? Please advise

## 2019-01-26 MED ORDER — TERCONAZOLE 0.8 % VA CREA: 1.0000 | TOPICAL_CREAM | Freq: Every day | VAGINAL | 0 refills | Status: DC

## 2019-01-26 NOTE — Telephone Encounter (Signed)
Yes, agree to prescribe Terazol 3.

## 2019-01-26 NOTE — Telephone Encounter (Signed)
Rx sent- patient aware.  

## 2019-01-26 NOTE — Telephone Encounter (Signed)
Left message for pt to call.

## 2019-02-09 ENCOUNTER — Ambulatory Visit (HOSPITAL_COMMUNITY)
Admission: EM | Admit: 2019-02-09 | Discharge: 2019-02-09 | Discharge status: Against Medical Advice | Payer: Managed Care, Other (non HMO)

## 2019-02-09 NOTE — ED Notes (Signed)
Patient did not answer when called for triage

## 2019-02-09 NOTE — ED Notes (Signed)
Called for patient x 3 without response.  Will d/c.

## 2019-06-29 ENCOUNTER — Ambulatory Visit (INDEPENDENT_AMBULATORY_CARE_PROVIDER_SITE_OTHER): Payer: 59 | Admitting: Obstetrics & Gynecology

## 2019-06-29 ENCOUNTER — Other Ambulatory Visit: Payer: Self-pay

## 2019-06-29 ENCOUNTER — Encounter: Payer: Self-pay | Admitting: Obstetrics & Gynecology

## 2019-06-29 VITALS — BP 144/92 | Ht 64.5 in | Wt 182.0 lb

## 2019-06-29 DIAGNOSIS — Z113 Encounter for screening for infections with a predominantly sexual mode of transmission: Secondary | ICD-10-CM

## 2019-06-29 DIAGNOSIS — N951 Menopausal and female climacteric states: Secondary | ICD-10-CM

## 2019-06-29 DIAGNOSIS — Z789 Other specified health status: Secondary | ICD-10-CM

## 2019-06-29 DIAGNOSIS — Z6830 Body mass index (BMI) 30.0-30.9, adult: Secondary | ICD-10-CM

## 2019-06-29 DIAGNOSIS — E6609 Other obesity due to excess calories: Secondary | ICD-10-CM

## 2019-06-29 DIAGNOSIS — Z01419 Encounter for gynecological examination (general) (routine) without abnormal findings: Secondary | ICD-10-CM

## 2019-06-29 NOTE — Patient Instructions (Signed)
1. Encounter for routine gynecological examination with Papanicolaou smear of cervix Normal gynecologic exam.  ASCUS with negative high-risk HPV on Pap test June 2019, Pap reflex done today.  Breast exam normal.  Patient will schedule a screening mammogram now.  Will call gastroenterologist to schedule a screening colonoscopy as well.  Health labs with family physician.  2. Screen for STD (sexually transmitted disease) Strict condom use recommended. - Gono-Chlam on Pap - HIV antibody (with reflex) - RPR - Hepatitis C Antibody - Hepatitis B Surface AntiGEN  3. Perimenopausal Will observe.  No manses post endometrial ablation.  Vasomotor menopausal symptoms are mild and intermittent.  Recommend vitamin D supplements, calcium intake of 1200 mg daily and regular weightbearing physical activities.  4. Use of condoms for contraception  5. Class 1 obesity due to excess calories without serious comorbidity with body mass index (BMI) of 30.0 to 30.9 in adult Recommend a lower calorie/carb diet such as [employer].  Increase physical activity with aerobic activities 5 times a week and weightlifting every 2 days.  Sheri Murphy, it was a pleasure seeing you today!  I will inform you of your results as soon as they are available.

## 2019-06-29 NOTE — Progress Notes (Signed)
Sheri Murphy [birth year] [id number]   History:    53 y.o. G3P0A3 Single  RP:  Established patient presenting for annual gyn exam   HPI: No menses status post endometrial ablation.  Occasionally feeling uterine cramps without bleeding.  Intermittent mild hot flushes.  Uses condoms for contraception.  No abnormal vaginal discharge.  Urine and bowel movements normal.  Breasts normal.  Body mass index 30.76.  Not regularly physically active.  Health labs with family physician.  Mother with colon cancer.  Patient had a screening colonoscopy showing a benign polyp that was excised in 2016.  Past medical history,surgical history, family history and social history were all reviewed and documented in the EPIC chart.  Gynecologic History No LMP recorded. Patient has had an ablation. Contraception: condoms Last Pap: 05/2018. Results were: ASCUS/HPV HR negative Last mammogram: 12/2017. Results were: Negative Bone Density: Never Colonoscopy: 2016 Benign polyp.  Mother with Colon Cancer.  Will schedule repeat Colonoscopy now.  Obstetric History OB History  Gravida Para Term Preterm AB Living  3 0     2 0  SAB TAB Ectopic Multiple Live Births  1            # Outcome Date GA Lbr Len/2nd Weight Sex Delivery Anes PTL Lv  3 Gravida           2 AB           1 SAB              ROS: A ROS was performed and pertinent positives and negatives are included in the history.  GENERAL: No fevers or chills. HEENT: No change in vision, no earache, sore throat or sinus congestion. NECK: No pain or stiffness. CARDIOVASCULAR: No chest pain or pressure. No palpitations. PULMONARY: No shortness of breath, cough or wheeze. GASTROINTESTINAL: No abdominal pain, nausea, vomiting or diarrhea, melena or bright red blood per rectum. GENITOURINARY: No urinary frequency, urgency, hesitancy or dysuria. MUSCULOSKELETAL: No joint or muscle pain, no back pain, no recent trauma. DERMATOLOGIC: No rash, no itching, no lesions.  ENDOCRINE: No polyuria, polydipsia, no heat or cold intolerance. No recent change in weight. HEMATOLOGICAL: No anemia or easy bruising or bleeding. NEUROLOGIC: No headache, seizures, numbness, tingling or weakness. PSYCHIATRIC: No depression, no loss of interest in normal activity or change in sleep pattern.     Exam:   BP (!) 144/92   Ht 5' 4.5" (1.638 m)   Wt 182 lb (82.6 kg)   BMI 30.76 kg/m   Body mass index is 30.76 kg/m.  General appearance : Well developed well nourished female. No acute distress HEENT: Eyes: no retinal hemorrhage or exudates,  Neck supple, trachea midline, no carotid bruits, no thyroidmegaly Lungs: Clear to auscultation, no rhonchi or wheezes, or rib retractions  Heart: Regular rate and rhythm, no murmurs or gallops Breast:Examined in sitting and supine position were symmetrical in appearance, no palpable masses or tenderness,  no skin retraction, no nipple inversion, no nipple discharge, no skin discoloration, no axillary or supraclavicular lymphadenopathy Abdomen: no palpable masses or tenderness, no rebound or guarding Extremities: no edema or skin discoloration or tenderness  Pelvic: Vulva: Normal             Vagina: No gross lesions or discharge  Cervix: No gross lesions or discharge.  Pap reflex done.  Gono-Chlam on Pap.  Uterus  AV, normal size, shape and consistency, non-tender and mobile  Adnexa  Without masses or tenderness  Anus: Normal  Assessment/Plan:  53 y.o. female for annual exam   1. Encounter for routine gynecological examination with Papanicolaou smear of cervix Normal gynecologic exam.  ASCUS with negative high-risk HPV on Pap test June 2019, Pap reflex done today.  Breast exam normal.  Patient will schedule a screening mammogram now.  Will call gastroenterologist to schedule a screening colonoscopy as well.  Health labs with family physician.  2. Screen for STD (sexually transmitted disease) Strict condom use recommended. -  Gono-Chlam on Pap - HIV antibody (with reflex) - RPR - Hepatitis C Antibody - Hepatitis B Surface AntiGEN  3. Perimenopausal Will observe.  No manses post endometrial ablation.  Vasomotor menopausal symptoms are mild and intermittent.  Recommend vitamin D supplements, calcium intake of 1200 mg daily and regular weightbearing physical activities.  4. Use of condoms for contraception  5. Class 1 obesity due to excess calories without serious comorbidity with body mass index (BMI) of 30.0 to 30.9 in adult Recommend a lower calorie/carb diet such as [employer].  Increase physical activity with aerobic activities 5 times a week and weightlifting every 2 days.  [name] MD, 2:43 PM 06/29/2019

## 2019-06-29 NOTE — Addendum Note (Signed)
Addended by: Thurnell Garbe A on: 06/29/2019 03:38 PM   Modules accepted: Orders

## 2019-06-30 LAB — HEPATITIS B SURFACE ANTIGEN: Hepatitis B Surface Ag: NONREACTIVE

## 2019-06-30 LAB — RPR: RPR Ser Ql: NONREACTIVE

## 2019-06-30 LAB — HEPATITIS C ANTIBODY
Hepatitis C Ab: NONREACTIVE
SIGNAL TO CUT-OFF: 0 (ref <1.00)

## 2019-06-30 LAB — HIV ANTIBODY (ROUTINE TESTING W REFLEX): HIV 1&2 Ab, 4th Generation: NONREACTIVE

## 2019-07-01 LAB — PAP THINPREP ASCUS RFLX HPV RFLX TYPE
C. trachomatis RNA, TMA: NOT DETECTED
N. gonorrhoeae RNA, TMA: NOT DETECTED

## 2019-07-26 ENCOUNTER — Other Ambulatory Visit: Payer: Self-pay | Admitting: Obstetrics & Gynecology

## 2019-07-26 ENCOUNTER — Encounter: Payer: Self-pay | Admitting: Obstetrics & Gynecology

## 2019-07-26 ENCOUNTER — Ambulatory Visit (INDEPENDENT_AMBULATORY_CARE_PROVIDER_SITE_OTHER): Payer: 59

## 2019-07-26 ENCOUNTER — Other Ambulatory Visit: Payer: Self-pay

## 2019-07-26 ENCOUNTER — Ambulatory Visit (INDEPENDENT_AMBULATORY_CARE_PROVIDER_SITE_OTHER): Payer: 59 | Admitting: Obstetrics & Gynecology

## 2019-07-26 VITALS — BP 134/82

## 2019-07-26 DIAGNOSIS — R102 Pelvic and perineal pain: Secondary | ICD-10-CM | POA: Diagnosis not present

## 2019-07-26 DIAGNOSIS — D251 Intramural leiomyoma of uterus: Secondary | ICD-10-CM | POA: Diagnosis not present

## 2019-07-26 MED ORDER — NORETHINDRONE 0.35 MG PO TABS: 1.0000 | ORAL_TABLET | Freq: Every day | ORAL | 4 refills | Status: DC

## 2019-07-26 NOTE — Progress Notes (Signed)
    Sheri Murphy May 12, 1966 [id number]        53 y.o.  [id number] Single  RP: Worsening stabbing mid pelvic pain intermittently x 1 month  HPI: S/P Endometrial ablation.  No menstrual periods.  No BTB. Occasional hot flushes.  Worsenting episodes of stabbing mid pelvic pain x 1 month.  Using condoms when sexually active.  No pain with IC.  Normal vaginal secretions.  Urine normal.  BMs normal.   OB History  Gravida Para Term Preterm AB Living  3 0     2 0  SAB TAB Ectopic Multiple Live Births  1            # Outcome Date GA Lbr Len/2nd Weight Sex Delivery Anes PTL Lv  3 Gravida           2 AB           1 SAB             Past medical history,surgical history, problem list, medications, allergies, family history and social history were all reviewed and documented in the EPIC chart.   Directed ROS with pertinent positives and negatives documented in the history of present illness/assessment and plan.  Exam:  Vitals:   07/26/19 1205  BP: 134/82   General appearance:  Normal  Abdomen: Normal  Gynecologic exam: Vulva normal.  Pelvic US today: T/V and T/a images.  Anteverted uterus measuring 9.51 x 6.4 x 5.9 cm.  Intramural fibroids measuring 2.8 x 1.8 cm, 2.4 x 2.7 cm, 5.5 x 3.3 x 5.5 cm with cystic degeneration.  Status post endometrial ablation, unable to identify the endometrium.  Right and left ovaries normal.  No free fluid in the posterior cul-de-sac.  U/A: Yellow clear, nitrites negative, protein trace, white blood cells 0-5, red blood cells 3-10, few bacteria.  Urine culture pending.   Assessment/Plan:  52 y.o. G3P0020    1. Pelvic pain in female Intermittent worsening mid pelvic pain x1 month.  Pelvic ultrasound done today shows a mildly increase size of the uterus with intramural fibroids, the largest of which is 5.5 x 3.3 x 5.5 cm and showing cystic degeneration.  The pelvic ultrasound is otherwise normal.  Urine analysis is mildly perturbed, will wait on urine  culture.  Decision to start on the progestin only birth control pill to block ovulation and decrease estrogen stimulation to the fibroids.  Patient will take ibuprofen up to 800 mg every 8 hours as needed.  Depo-Lupron to block estrogen production discussed with patient if the progestin only pill is not sufficient to control the symptoms.  Myomectomy and hysterectomy also reviewed with patient. - Urinalysis,Complete w/RFL Culture  2. Fibroids, intramural Degenerating intramural uterine fibroid.  Management as above.  Other orders - norethindrone (MICRONOR) 0.35 MG tablet; Take 1 tablet (0.35 mg total) by mouth daily. - US Transvaginal Non-OB - Urinalysis,Complete w/RFL Culture  Counseling on above issues and coordination of care more than 50% for 25 minutes.  Sheri Bruins MD, 12:24 PM 07/26/2019

## 2019-07-27 ENCOUNTER — Encounter: Payer: Self-pay | Admitting: Obstetrics & Gynecology

## 2019-07-27 NOTE — Patient Instructions (Signed)
  1. Pelvic pain in female Intermittent worsening mid pelvic pain x1 month.  Pelvic ultrasound done today shows a mildly increase size of the uterus with intramural fibroids, the largest of which is 5.5 x 3.3 x 5.5 cm and showing cystic degeneration.  The pelvic ultrasound is otherwise normal.  Urine analysis is mildly perturbed, will wait on urine culture.  Decision to start on the progestin only birth control pill to block ovulation and decrease estrogen stimulation to the fibroids.  Patient will take ibuprofen up to 800 mg every 8 hours as needed.  Depo-Lupron to block estrogen production discussed with patient if the progestin only pill is not sufficient to control the symptoms.  Myomectomy and hysterectomy also reviewed with patient. - Urinalysis,Complete w/RFL Culture  2. Fibroids, intramural Degenerating intramural uterine fibroid.  Management as above.  Other orders - norethindrone (MICRONOR) 0.35 MG tablet; Take 1 tablet (0.35 mg total) by mouth daily. - US Transvaginal Non-OB - Urinalysis,Complete w/RFL Culture  Puneet, it was a pleasure seeing you today!  I will inform you of your results as soon as they are available.

## 2019-07-28 LAB — URINALYSIS, COMPLETE W/RFL CULTURE
Bilirubin Urine: NEGATIVE
Glucose, UA: NEGATIVE
Hyaline Cast: NONE SEEN /LPF
Ketones, ur: NEGATIVE
Leukocyte Esterase: NEGATIVE
Nitrites, Initial: NEGATIVE
Specific Gravity, Urine: 1.015 (ref 1.001–1.03)
pH: 5 (ref 5.0–8.0)

## 2019-07-28 LAB — CULTURE INDICATED

## 2019-07-28 LAB — URINE CULTURE
MICRO NUMBER:: 710915
SPECIMEN QUALITY:: ADEQUATE

## 2019-08-27 ENCOUNTER — Telehealth: Payer: Self-pay | Admitting: *Deleted

## 2019-08-27 DIAGNOSIS — R102 Pelvic and perineal pain: Secondary | ICD-10-CM

## 2019-08-27 NOTE — Telephone Encounter (Signed)
Schedule a visit with a pelvic US.

## 2019-08-27 NOTE — Telephone Encounter (Signed)
Patient called to follow up from [place] on 07/26/19 mid pelvic pain intermittently has been taking Ibuprofen 600 mg tablet and no relief. Still taking Micronor 0.35 mg tablet, spotting started on Sunday. Patient said you told her to call if ibuprofen 600 mg tablet is not working for pain. Please advise

## 2019-08-30 NOTE — Telephone Encounter (Signed)
No Pelvic US, schedule just a visit with me to discuss Hysterectomy.

## 2019-08-30 NOTE — Telephone Encounter (Signed)
Dr. Dellis Filbert patient said she had ultrasound at last visit on 07/26/19, still schedule another ultrasound?

## 2019-08-31 NOTE — Telephone Encounter (Signed)
Patient informed. 

## 2019-09-15 ENCOUNTER — Other Ambulatory Visit: Payer: Self-pay

## 2019-09-16 ENCOUNTER — Ambulatory Visit (INDEPENDENT_AMBULATORY_CARE_PROVIDER_SITE_OTHER): Payer: 59 | Admitting: Obstetrics & Gynecology

## 2019-09-16 ENCOUNTER — Encounter: Payer: Self-pay | Admitting: Obstetrics & Gynecology

## 2019-09-16 VITALS — BP 134/88

## 2019-09-16 DIAGNOSIS — N898 Other specified noninflammatory disorders of vagina: Secondary | ICD-10-CM | POA: Diagnosis not present

## 2019-09-16 DIAGNOSIS — R102 Pelvic and perineal pain: Secondary | ICD-10-CM | POA: Diagnosis not present

## 2019-09-16 DIAGNOSIS — Z113 Encounter for screening for infections with a predominantly sexual mode of transmission: Secondary | ICD-10-CM

## 2019-09-16 DIAGNOSIS — D219 Benign neoplasm of connective and other soft tissue, unspecified: Secondary | ICD-10-CM | POA: Diagnosis not present

## 2019-09-16 LAB — WET PREP FOR TRICH, YEAST, CLUE

## 2019-09-16 MED ORDER — TINIDAZOLE 500 MG PO TABS: 2.0000 g | ORAL_TABLET | Freq: Every day | ORAL | 0 refills | Status: AC

## 2019-09-16 NOTE — Progress Notes (Signed)
    Sheri Murphy [birth year] [id number]        53 y.o.  [id number]   RP: Vaginal odor/intermittent pelvic pain  HPI: S/P Endometrial ablation.  No menstrual periods.  No BTB. Occasional hot flushes.  Worsening episodes of stabbing mid pelvic pain x 3 months, although better now x 2 weeks.  Using condoms when sexually active.  No pain with IC.  Odor with vaginal discharge.  Urine normal.  BMs normal.   OB History  Gravida Para Term Preterm AB Living  3 0     2 0  SAB TAB Ectopic Multiple Live Births  1            # Outcome Date GA Lbr Len/2nd Weight Sex Delivery Anes PTL Lv  3 Gravida           2 AB           1 SAB             Past medical history,surgical history, problem list, medications, allergies, family history and social history were all reviewed and documented in the EPIC chart.   Directed ROS with pertinent positives and negatives documented in the history of present illness/assessment and plan.  Exam:  Vitals:   09/16/19 1613  BP: 134/88   General appearance:  Normal  Abdomen: Normal  Gynecologic exam: Vulva normal.  Speculum:  Cervix/Vagina normal.  Increased discharge.  Wet prep done.  Bimanual exam:  Uterus AV, normal volume, mobile, NT.  No adnexal mass, NT bilaterally.  Wet prep:  Clue cells present  Pelvic US 07/26/19: T/V and T/A images. Anteverted uterus measuring 9.51 x 6.4 x 5.9 cm. Intramural fibroids measuring 2.8 x 1.8 cm, 2.4 x 2.7 cm, 5.5 x 3.3 x 5.5 cm with cystic degeneration. Status post endometrial ablation, unable to identifythe endometrium. Right and left ovaries normal. No free fluid in the posterior cul-de-sac.   Assessment/Plan:  53 y.o. G3P0020    1. Vaginal odor Bacterial vaginosis confirmed by wet prep.  Results and treatment reviewed.  Tinidazole 2 tablets twice a day for 2 days prescribed. - WET PREP FOR TRICH, YEAST, CLUE  2. Pelvic pain in female Intermittent pelvic pain.  Probably due to degenerating fibroids.  Improved x  last 2 weeks.  Decision to observe.  3. Fibroids Degenerating Fibroids per last Pelvic US July 2020.  Largest fibroid with cystic degeneration was 5.5 cm.  4. Screen for STD (sexually transmitted disease) Strict condom use recommended. - HIV antibody (with reflex) - RPR - Hepatitis C Antibody - Hepatitis B Surface AntiGEN - C. trachomatis/N. gonorrhoeae RNA  Other orders - tinidazole (TINDAMAX) 500 MG tablet; Take 4 tablets (2,000 mg total) by mouth daily for 2 days.  Counseling on above issues and coordination of care more than 50% for 25 minutes.  [name] MD, 4:32 PM 09/16/2019

## 2019-09-17 ENCOUNTER — Ambulatory Visit: Payer: 59 | Admitting: Obstetrics & Gynecology

## 2019-09-18 ENCOUNTER — Encounter: Payer: Self-pay | Admitting: Obstetrics & Gynecology

## 2019-09-18 LAB — C. TRACHOMATIS/N. GONORRHOEAE RNA
C. trachomatis RNA, TMA: NOT DETECTED
N. gonorrhoeae RNA, TMA: NOT DETECTED

## 2019-09-18 NOTE — Patient Instructions (Signed)
1. Vaginal odor Bacterial vaginosis confirmed by wet prep.  Results and treatment reviewed.  Tinidazole 2 tablets twice a day for 2 days prescribed. - WET PREP FOR TRICH, YEAST, CLUE  2. Pelvic pain in female Intermittent pelvic pain.  Probably due to degenerating fibroids.  Improved x last 2 weeks.  Decision to observe.  3. Fibroids Degenerating Fibroids per last Pelvic US July 2020.  Largest fibroid with cystic degeneration was 5.5 cm.  4. Screen for STD (sexually transmitted disease) Strict condom use recommended. - HIV antibody (with reflex) - RPR - Hepatitis C Antibody - Hepatitis B Surface AntiGEN - C. trachomatis/N. gonorrhoeae RNA  Other orders - tinidazole (TINDAMAX) 500 MG tablet; Take 4 tablets (2,000 mg total) by mouth daily for 2 days.  Nathalie, it was a pleasure seeing you today!  I will inform you of your results as soon as they are available.

## 2019-09-20 ENCOUNTER — Encounter: Payer: Self-pay | Admitting: *Deleted

## 2019-09-20 LAB — HEPATITIS C ANTIBODY
Hepatitis C Ab: NONREACTIVE
SIGNAL TO CUT-OFF: 0.01 (ref <1.00)

## 2019-09-20 LAB — HEPATITIS B SURFACE ANTIGEN: Hepatitis B Surface Ag: NONREACTIVE

## 2019-09-20 LAB — RPR: RPR Ser Ql: NONREACTIVE

## 2019-09-20 LAB — HIV ANTIBODY (ROUTINE TESTING W REFLEX): HIV 1&2 Ab, 4th Generation: NONREACTIVE

## 2019-12-22 ENCOUNTER — Encounter: Payer: Self-pay | Admitting: Women's Health

## 2019-12-22 ENCOUNTER — Other Ambulatory Visit: Payer: Self-pay

## 2019-12-22 ENCOUNTER — Other Ambulatory Visit: Payer: Self-pay | Admitting: Obstetrics & Gynecology

## 2019-12-22 ENCOUNTER — Ambulatory Visit (INDEPENDENT_AMBULATORY_CARE_PROVIDER_SITE_OTHER): Payer: 59 | Admitting: Women's Health

## 2019-12-22 VITALS — BP 130/84

## 2019-12-22 DIAGNOSIS — N760 Acute vaginitis: Secondary | ICD-10-CM

## 2019-12-22 DIAGNOSIS — B9689 Other specified bacterial agents as the cause of diseases classified elsewhere: Secondary | ICD-10-CM

## 2019-12-22 DIAGNOSIS — Z1231 Encounter for screening mammogram for malignant neoplasm of breast: Secondary | ICD-10-CM

## 2019-12-22 LAB — WET PREP FOR TRICH, YEAST, CLUE

## 2019-12-22 MED ORDER — FLUCONAZOLE 150 MG PO TABS: ORAL_TABLET | ORAL | 1 refills | Status: DC

## 2019-12-22 MED ORDER — TINIDAZOLE 500 MG PO TABS: ORAL_TABLET | ORAL | 0 refills | Status: DC

## 2019-12-22 NOTE — Patient Instructions (Addendum)
Good to see you today Mammogram  [phone] breast center   Bacterial Vaginosis  Bacterial vaginosis is a vaginal infection that occurs when the normal balance of bacteria in the vagina is disrupted. It results from an overgrowth of certain bacteria. This is the most common vaginal infection among women ages 89-44. Because bacterial vaginosis increases your risk for STIs (sexually transmitted infections), getting treated can help reduce your risk for chlamydia, gonorrhea, herpes, and HIV (human immunodeficiency virus). Treatment is also important for preventing complications in pregnant women, because this condition can cause an early (premature) delivery. What are the causes? This condition is caused by an increase in harmful bacteria that are normally present in small amounts in the vagina. However, the reason that the condition develops is not fully understood. What increases the risk? The following factors may make you more likely to develop this condition:  Having a new sexual partner or multiple sexual partners.  Having unprotected sex.  Douching.  Having an intrauterine device (IUD).  Smoking.  Drug and alcohol abuse.  Taking certain antibiotic medicines.  Being pregnant. You cannot get bacterial vaginosis from toilet seats, bedding, swimming pools, or contact with objects around you. What are the signs or symptoms? Symptoms of this condition include:  Grey or white vaginal discharge. The discharge can also be watery or foamy.  A fish-like odor with discharge, especially after sexual intercourse or during menstruation.  Itching in and around the vagina.  Burning or pain with urination. Some women with bacterial vaginosis have no signs or symptoms. How is this diagnosed? This condition is diagnosed based on:  Your medical history.  A physical exam of the vagina.  Testing a sample of vaginal fluid under a microscope to look for a large amount of bad bacteria or  abnormal cells. Your health care provider may use a cotton swab or a small wooden spatula to collect the sample. How is this treated? This condition is treated with antibiotics. These may be given as a pill, a vaginal cream, or a medicine that is put into the vagina (suppository). If the condition comes back after treatment, a second round of antibiotics may be needed. Follow these instructions at home: Medicines  Take over-the-counter and prescription medicines only as told by your health care provider.  Take or use your antibiotic as told by your health care provider. Do not stop taking or using the antibiotic even if you start to feel better. General instructions  If you have a female sexual partner, tell her that you have a vaginal infection. She should see her health care provider and be treated if she has symptoms. If you have a female sexual partner, he does not need treatment.  During treatment: ? Avoid sexual activity until you finish treatment. ? Do not douche. ? Avoid alcohol as directed by your health care provider. ? Avoid breastfeeding as directed by your health care provider.  Drink enough water and fluids to keep your urine clear or pale yellow.  Keep the area around your vagina and rectum clean. ? Wash the area daily with warm water. ? Wipe yourself from front to back after using the toilet.  Keep all follow-up visits as told by your health care provider. This is important. How is this prevented?  Do not douche.  Wash the outside of your vagina with warm water only.  Use protection when having sex. This includes latex condoms and dental dams.  Limit how many sexual partners you have. To help prevent  bacterial vaginosis, it is best to have sex with just one partner (monogamous).  Make sure you and your sexual partner are tested for STIs.  Wear cotton or cotton-lined underwear.  Avoid wearing tight pants and pantyhose, especially during summer.  Limit the amount  of alcohol that you drink.  Do not use any products that contain nicotine or tobacco, such as cigarettes and e-cigarettes. If you need help quitting, ask your health care provider.  Do not use illegal drugs. Where to find more information  Centers for Disease Control and Prevention: AppraiserFraud.fi  American Sexual Health Association (ASHA): www.ashastd.org  [place].S[place] Department of Health and Financial controller, Office on Women's Health: DustingSprays.pl or SecuritiesCard.it Contact a health care provider if:  Your symptoms do not improve, even after treatment.  You have more discharge or pain when urinating.  You have a fever.  You have pain in your abdomen.  You have pain during sex.  You have vaginal bleeding between periods. Summary  Bacterial vaginosis is a vaginal infection that occurs when the normal balance of bacteria in the vagina is disrupted.  Because bacterial vaginosis increases your risk for STIs (sexually transmitted infections), getting treated can help reduce your risk for chlamydia, gonorrhea, herpes, and HIV (human immunodeficiency virus). Treatment is also important for preventing complications in pregnant women, because the condition can cause an early (premature) delivery.  This condition is treated with antibiotic medicines. These may be given as a pill, a vaginal cream, or a medicine that is put into the vagina (suppository). This information is not intended to replace advice given to you by your health care provider. Make sure you discuss any questions you have with your health care provider. Document Released: 12/16/2005 Document Revised: 11/28/2017 Document Reviewed: 08/31/2016 Elsevier Patient Education  2020 [employer].

## 2019-12-22 NOTE — Progress Notes (Signed)
53 year old SBF G3, P0 presents with watery discharge with odor, vaginal itching for the past 10 days but has increased over the last few.  Denies urinary symptoms, abdominal/back pain or fever.  Same partner with negative STD screen.  04/2018 hysteroscopy for benign endometrial polyp, had an endometrial ablation has been amenorrheic since.  Medical problems include hypertension, diabetes and IBS.  States has had some problems with abnormal blood sugars recently but states is getting back on track.  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  Exam: Appears well.  No CVAT.  Abdomen soft, obese, nontender no rebound radiation of pain.  External genitalia mild erythema at introitus, speculum exam moderate amount of pasty white discharge with odor, wet prep positive for clues, many bacteria.  Bimanual no CMT or adnexal tenderness.  Bacterial vaginosis  Plan: States has nausea and stomach issues with Flagyl, will try Tindamax 500 mg twice daily for 5 days prescription, proper use given and reviewed alcohol precautions.  Diflucan 150 p.o. today repeat in 3 days if needed.  Prescription given for both.  Instructed to call if no relief of symptoms.  Overdue for mammogram instructed to schedule.

## 2019-12-30 ENCOUNTER — Ambulatory Visit: Payer: 59 | Attending: Internal Medicine

## 2019-12-30 DIAGNOSIS — U071 COVID-19: Secondary | ICD-10-CM

## 2019-12-30 DIAGNOSIS — R238 Other skin changes: Secondary | ICD-10-CM

## 2019-12-31 HISTORY — PX: COLONOSCOPY: SHX174

## 2019-12-31 HISTORY — PX: BREAST BIOPSY: SHX20

## 2019-12-31 LAB — NOVEL CORONAVIRUS, NAA: SARS-CoV-2, NAA: NOT DETECTED

## 2020-01-10 ENCOUNTER — Telehealth: Payer: Self-pay | Admitting: *Deleted

## 2020-01-10 NOTE — Telephone Encounter (Signed)
Patient called and left message c/o breast pain in right breast. Asked for order faxed to center. I called patient back and left detailed message on cell to schedule visit with provider, no order can be office based on office protocol.

## 2020-02-14 ENCOUNTER — Other Ambulatory Visit: Payer: Self-pay

## 2020-02-16 ENCOUNTER — Encounter: Payer: Self-pay | Admitting: Obstetrics & Gynecology

## 2020-02-16 ENCOUNTER — Other Ambulatory Visit: Payer: Self-pay

## 2020-02-16 ENCOUNTER — Telehealth: Payer: Self-pay

## 2020-02-16 ENCOUNTER — Ambulatory Visit (INDEPENDENT_AMBULATORY_CARE_PROVIDER_SITE_OTHER): Payer: 59 | Admitting: Obstetrics & Gynecology

## 2020-02-16 VITALS — BP 124/80

## 2020-02-16 DIAGNOSIS — N898 Other specified noninflammatory disorders of vagina: Secondary | ICD-10-CM

## 2020-02-16 DIAGNOSIS — R102 Pelvic and perineal pain: Secondary | ICD-10-CM | POA: Diagnosis not present

## 2020-02-16 DIAGNOSIS — B9689 Other specified bacterial agents as the cause of diseases classified elsewhere: Secondary | ICD-10-CM | POA: Diagnosis not present

## 2020-02-16 DIAGNOSIS — Z113 Encounter for screening for infections with a predominantly sexual mode of transmission: Secondary | ICD-10-CM | POA: Diagnosis not present

## 2020-02-16 LAB — WET PREP FOR TRICH, YEAST, CLUE

## 2020-02-16 MED ORDER — NITROFURANTOIN MONOHYD MACRO 100 MG PO CAPS: 100.0000 mg | ORAL_CAPSULE | Freq: Two times a day (BID) | ORAL | 0 refills | Status: DC

## 2020-02-16 MED ORDER — TINIDAZOLE 500 MG PO TABS: 1.0000 g | ORAL_TABLET | Freq: Two times a day (BID) | ORAL | 0 refills | Status: AC

## 2020-02-16 MED ORDER — FLUCONAZOLE 150 MG PO TABS: 150.0000 mg | ORAL_TABLET | Freq: Every day | ORAL | 0 refills | Status: AC

## 2020-02-16 MED ORDER — TINIDAZOLE 500 MG PO TABS: 500.0000 mg | ORAL_TABLET | Freq: Once | ORAL | 0 refills | Status: DC

## 2020-02-16 MED ORDER — SULFAMETHOXAZOLE-TRIMETHOPRIM 800-160 MG PO TABS: 1.0000 | ORAL_TABLET | Freq: Two times a day (BID) | ORAL | 0 refills | Status: AC

## 2020-02-16 NOTE — Telephone Encounter (Signed)
I spoke with Dr. Dellis Filbert about this and she changed Rx to Bactrim DS and went ahead and sent Rx.  Patient was informed and I added the allergy to Macrodantin to her chart.

## 2020-02-16 NOTE — Progress Notes (Signed)
    Sheri Murphy 1966/04/03 [id number]        54 y.o.  [id number] Single  RP: Pelvic pain with vaginal odor  HPI: C/O pelvic pain with vaginal odor.  Pelvic pain is present, but no burning with miction and no frequency.  No blood seen in urine.  No fever.   Last episode of BV was 08/2019.  Sexually active, in a non-committed relationship, didn't use condoms.  No menses post Endometrial Ablation.  Had bilateral breast tenderness, but now resolved.  Will schedule a screening mammo.   OB History  Gravida Para Term Preterm AB Living  3 0     2 0  SAB TAB Ectopic Multiple Live Births  1            # Outcome Date GA Lbr Len/2nd Weight Sex Delivery Anes PTL Lv  3 Gravida           2 AB           1 SAB             Past medical history,surgical history, problem list, medications, allergies, family history and social history were all reviewed and documented in the EPIC chart.   Directed ROS with pertinent positives and negatives documented in the history of present illness/assessment and plan.  Exam:  Vitals:   02/16/20 1406  BP: 124/80   General appearance:  Normal  Abdomen: Normal  CVAT Negative Bilaterally  Gynecologic exam: Vulva normal.  Speculum:  Cervix/Vagina normal.  Increased vaginal discharge. Gono-Chlam done.  Wet prep done.  Wet prep:  Clue cells present U/A: Yellow clear, protein negative, nitrite negative, white blood cells 0-5, red blood cells 0-2, bacteria many, moderate clue cells present.  Urine culture pending.   Assessment/Plan:  54 y.o. G3P0020   1. Vaginal odor Bacterial vaginosis confirmed by wet prep.  Will treat with tinidazole.  Usage reviewed and prescription sent to pharmacy.  Also treating for possible cystitis.  Patient will take fluconazole after the antibiotic treatment as needed.  Prescription sent to pharmacy. - Urinalysis,Complete w/RFL Culture - Wet prep  2. Female pelvic pain Possible acute cystitis.  Treat with Bactrim DS 1 tablet twice a  day for 3 days.  Prescription sent to pharmacy.  Will also rule out an STI.   - Urinalysis,Complete w/RFL Culture  3. Screen for STD (sexually transmitted disease) Strict condom use strongly recommended. - Gono-Chlam - HIV antibody (with reflex) - RPR - Hepatitis C Antibody - Hepatitis B Surface AntiGEN  Other orders - lisinopril (ZESTRIL) 5 MG tablet; Take 1 tablet by mouth daily. - fluconazole (DIFLUCAN) 150 MG tablet; Take 1 tablet (150 mg total) by mouth daily for 3 days. - tinidazole (TINDAMAX) 500 MG tablet; Take 2 tablets (1,000 mg total) by mouth 2 (two) times daily for 2 days. - sulfamethoxazole-trimethoprim (BACTRIM DS) 800-160 MG tablet; Take 1 tablet by mouth 2 (two) times daily for 3 days.  [name] MD, 2:35 PM 02/16/2020

## 2020-02-16 NOTE — Telephone Encounter (Signed)
Patient called and I spoke with pharmacist.  She has allergy to Macrobid with hives.  I will add it to her allergy list.   Meantime needs new Rx.

## 2020-02-17 ENCOUNTER — Encounter: Payer: Self-pay | Admitting: Obstetrics & Gynecology

## 2020-02-17 LAB — HIV ANTIBODY (ROUTINE TESTING W REFLEX): HIV 1&2 Ab, 4th Generation: NONREACTIVE

## 2020-02-17 LAB — RPR: RPR Ser Ql: NONREACTIVE

## 2020-02-17 LAB — GC PROBE AMP THINPREP: N. gonorrhoeae RNA, TMA: NOT DETECTED

## 2020-02-17 LAB — CHLAMYDIA PROBE AMP THINPREP: C. trachomatis RNA, TMA: NOT DETECTED

## 2020-02-17 LAB — HEPATITIS C ANTIBODY
Hepatitis C Ab: NONREACTIVE
SIGNAL TO CUT-OFF: 0.01 (ref <1.00)

## 2020-02-17 LAB — HEPATITIS B SURFACE ANTIGEN: Hepatitis B Surface Ag: NONREACTIVE

## 2020-02-17 NOTE — Patient Instructions (Signed)
1. Vaginal odor Bacterial vaginosis confirmed by wet prep.  Will treat with tinidazole.  Usage reviewed and prescription sent to pharmacy.  Also treating for possible cystitis.  Patient will take fluconazole after the antibiotic treatment as needed.  Prescription sent to pharmacy. - Urinalysis,Complete w/RFL Culture - Wet prep  2. Female pelvic pain Possible acute cystitis.  Treat with Bactrim DS 1 tablet twice a day for 3 days.  Prescription sent to pharmacy.  Will also rule out an STI.   - Urinalysis,Complete w/RFL Culture  3. Screen for STD (sexually transmitted disease) Strict condom use strongly recommended. - Gono-Chlam - HIV antibody (with reflex) - RPR - Hepatitis C Antibody - Hepatitis B Surface AntiGEN  Other orders - lisinopril (ZESTRIL) 5 MG tablet; Take 1 tablet by mouth daily. - fluconazole (DIFLUCAN) 150 MG tablet; Take 1 tablet (150 mg total) by mouth daily for 3 days. - tinidazole (TINDAMAX) 500 MG tablet; Take 2 tablets (1,000 mg total) by mouth 2 (two) times daily for 2 days. - sulfamethoxazole-trimethoprim (BACTRIM DS) 800-160 MG tablet; Take 1 tablet by mouth 2 (two) times daily for 3 days.  Sheri Murphy, it was a pleasure seeing you today!  I will inform you of your results as soon as they are available.

## 2020-02-18 LAB — URINALYSIS, COMPLETE W/RFL CULTURE
Bilirubin Urine: NEGATIVE
Glucose, UA: NEGATIVE
Hyaline Cast: NONE SEEN /LPF
Ketones, ur: NEGATIVE
Leukocyte Esterase: NEGATIVE
Nitrites, Initial: NEGATIVE
Protein, ur: NEGATIVE
Specific Gravity, Urine: 1.015 (ref 1.001–1.03)
pH: 5.5 (ref 5.0–8.0)

## 2020-02-18 LAB — URINE CULTURE
MICRO NUMBER:: 10159526
Result:: NO GROWTH
SPECIMEN QUALITY:: ADEQUATE

## 2020-02-18 LAB — CULTURE INDICATED

## 2020-03-08 ENCOUNTER — Ambulatory Visit: Payer: 59 | Admitting: Obstetrics & Gynecology

## 2020-03-27 ENCOUNTER — Ambulatory Visit (INDEPENDENT_AMBULATORY_CARE_PROVIDER_SITE_OTHER): Payer: 59 | Admitting: Obstetrics & Gynecology

## 2020-03-27 ENCOUNTER — Encounter: Payer: Self-pay | Admitting: Obstetrics & Gynecology

## 2020-03-27 ENCOUNTER — Other Ambulatory Visit: Payer: Self-pay

## 2020-03-27 VITALS — BP 134/82

## 2020-03-27 DIAGNOSIS — N898 Other specified noninflammatory disorders of vagina: Secondary | ICD-10-CM | POA: Diagnosis not present

## 2020-03-27 DIAGNOSIS — D219 Benign neoplasm of connective and other soft tissue, unspecified: Secondary | ICD-10-CM | POA: Diagnosis not present

## 2020-03-27 DIAGNOSIS — N921 Excessive and frequent menstruation with irregular cycle: Secondary | ICD-10-CM | POA: Diagnosis not present

## 2020-03-27 LAB — WET PREP FOR TRICH, YEAST, CLUE

## 2020-03-27 MED ORDER — METRONIDAZOLE 500 MG PO TABS: 500.0000 mg | ORAL_TABLET | Freq: Two times a day (BID) | ORAL | 2 refills | Status: AC

## 2020-03-27 MED ORDER — FLUCONAZOLE 150 MG PO TABS: 150.0000 mg | ORAL_TABLET | Freq: Once | ORAL | 2 refills | Status: AC

## 2020-03-27 NOTE — Patient Instructions (Signed)
1. Vaginal discharge Bacterial vaginosis confirmed by wet prep.  Last episode was mid February 2021, treated with tinidazole.  Decision to treat with metronidazole 500 mg/tab., 1 tablet per mouth twice a day for 7 days.  Will take of fluconazole after the antibiotic to treat or prevent yeast vaginitis.  Usage reviewed and prescription sent to pharmacy. - WET PREP FOR [name], YEAST, CLUE  2. Metrorrhagia First episode of vaginal bleeding after endometrial ablation.  Known uterine fibroids.  Patient will follow-up for a pelvic ultrasound to investigate further.  Management per findings. - US Transvaginal Non-OB; Future  3. Fibroids Follow-up pelvic ultrasound to reevaluate. - US Transvaginal Non-OB; Future  Other orders - glipiZIDE (GLUCOTROL XL) 10 MG 24 hr tablet; Take 10 mg by mouth daily with breakfast. - metroNIDAZOLE (FLAGYL) 500 MG tablet; Take 1 tablet (500 mg total) by mouth 2 (two) times daily for 7 days. - fluconazole (DIFLUCAN) 150 MG tablet; Take 1 tablet (150 mg total) by mouth once for 1 dose.  Sheri Murphy, it was a pleasure seeing you today!

## 2020-03-27 NOTE — Progress Notes (Signed)
    Sheri Murphy [birth year] [id number]        54 y.o.  [id number] Single  RP: Recurrence of vaginal discharge with odor  HPI: Vaginal discharge with odor. No itching.  Treated with Tinidazole 02/16/2020.  Also had mild vaginal bleeding for about 3 days, first episode since the Endometrial Ablation.  Known Fibroids.  Occasional lower abdominal pain, unchanged x  last Pelvic US 06/2019.  Abstinent x last visit 01/2020.  Urine/BMs normal.  No fever.   OB History  Gravida Para Term Preterm AB Living  3 0     2 0  SAB TAB Ectopic Multiple Live Births  1            # Outcome Date GA Lbr Len/2nd Weight Sex Delivery Anes PTL Lv  3 Gravida           2 AB           1 SAB             Past medical history,surgical history, problem list, medications, allergies, family history and social history were all reviewed and documented in the EPIC chart.   Directed ROS with pertinent positives and negatives documented in the history of present illness/assessment and plan.  Exam:  Vitals:   03/27/20 1236  BP: 134/82   General appearance:  Normal  Gynecologic exam: Vulva normal.  Speculum:  Cervix/Vagina normal.  Increased discharge.  No blood.  Wet prep done.  Wet prep: Clue cells present   Assessment/Plan:  54 y.o. G3P0020   1. Vaginal discharge Bacterial vaginosis confirmed by wet prep.  Last episode was mid February 2021, treated with tinidazole.  Decision to treat with metronidazole 500 mg/tab., 1 tablet per mouth twice a day for 7 days.  Will take of fluconazole after the antibiotic to treat or prevent yeast vaginitis.  Usage reviewed and prescription sent to pharmacy. - WET PREP FOR [place], YEAST, CLUE  2. Metrorrhagia First episode of vaginal bleeding after endometrial ablation.  Known uterine fibroids.  Patient will follow-up for a pelvic ultrasound to investigate further.  Management per findings. - US Transvaginal Non-OB; Future  3. Fibroids Follow-up pelvic ultrasound to reevaluate. -  US Transvaginal Non-OB; Future  Other orders - glipiZIDE (GLUCOTROL XL) 10 MG 24 hr tablet; Take 10 mg by mouth daily with breakfast. - metroNIDAZOLE (FLAGYL) 500 MG tablet; Take 1 tablet (500 mg total) by mouth 2 (two) times daily for 7 days. - fluconazole (DIFLUCAN) 150 MG tablet; Take 1 tablet (150 mg total) by mouth once for 1 dose.  [name] MD, 12:54 PM 03/27/2020

## 2020-04-04 DIAGNOSIS — R059 Cough, unspecified: Secondary | ICD-10-CM | POA: Insufficient documentation

## 2020-04-04 HISTORY — DX: Cough, unspecified: R05.9

## 2020-04-12 ENCOUNTER — Other Ambulatory Visit: Payer: Self-pay

## 2020-04-13 ENCOUNTER — Ambulatory Visit (INDEPENDENT_AMBULATORY_CARE_PROVIDER_SITE_OTHER): Payer: 59

## 2020-04-13 ENCOUNTER — Ambulatory Visit (INDEPENDENT_AMBULATORY_CARE_PROVIDER_SITE_OTHER): Payer: 59 | Admitting: Obstetrics & Gynecology

## 2020-04-13 ENCOUNTER — Encounter: Payer: Self-pay | Admitting: Obstetrics & Gynecology

## 2020-04-13 VITALS — BP 130/70

## 2020-04-13 DIAGNOSIS — R9389 Abnormal findings on diagnostic imaging of other specified body structures: Secondary | ICD-10-CM

## 2020-04-13 DIAGNOSIS — N921 Excessive and frequent menstruation with irregular cycle: Secondary | ICD-10-CM

## 2020-04-13 DIAGNOSIS — D219 Benign neoplasm of connective and other soft tissue, unspecified: Secondary | ICD-10-CM

## 2020-04-13 DIAGNOSIS — Z9889 Other specified postprocedural states: Secondary | ICD-10-CM

## 2020-04-13 DIAGNOSIS — R102 Pelvic and perineal pain: Secondary | ICD-10-CM

## 2020-04-13 NOTE — Progress Notes (Signed)
    Sheri Murphy [birth year] [id number]        53 y.o.  G3P0020   RP: Abnormal uterine bleeding post Endometrial Ablation with pelvic pain for Pelvic US  HPI: Known Uterine Fibroids.  History of endometrial ablation after many other attempts to control menorrhagia for many years.  Irregular vaginal bleeding recently with persistent crampy pelvic pain.   OB History  Gravida Para Term Preterm AB Living  3 0     2 0  SAB TAB Ectopic Multiple Live Births  1            # Outcome Date GA Lbr Len/2nd Weight Sex Delivery Anes PTL Lv  3 Gravida           2 AB           1 SAB             Past medical history,surgical history, problem list, medications, allergies, family history and social history were all reviewed and documented in the EPIC chart.   Directed ROS with pertinent positives and negatives documented in the history of present illness/assessment and plan.  Exam:  Vitals:   04/13/20 1512  BP: 130/70   General appearance:  Normal  Pelvic US today: T/V images.  Anteverted irregular uterus with several intramural and subserosal fibroids.  The overall uterine size is measured at 8.6 x 5.18 x 5.26 cm.  7 fibroids were measured in the 1 to 2 cm range.  Irregular fluid collections with debris's in central uterus probably secondary to a scarred cavity status post endometrial ablation with retained blood.  Both ovaries are normal in size with normal follicular pattern.  No adnexal mass.  No free fluid in the posterior cul-de-sac.   Assessment/Plan:  54 y.o. G3P0020   1. Metrorrhagia Irregular vaginal bleeding post endometrial ablation.  Pelvic ultrasound findings thoroughly reviewed with patient.  Very abnormal endometrial lining with irregular fluid collections and debris's probably secondary to a scarred uterine cavity status post endometrial ablation with retained blood.  Given the endometrial ablation, the intra uterine cavity cannot be well assessed.  Persistent pelvic pain and  multiple fibroids.  Decision to proceed with hysterectomy.  Counseling on approaches with preference for XI Robotic total laparoscopic hysterectomy with bilateral salpingectomy.  Information and pamphlet given.  Patient voiced understanding and agreement with plan.  Preop visit by tele-visit.  2. Pelvic pain in female Persistent crampy pelvic pain.  3. S/P endometrial ablation  4. Thickened endometrium Irregular thickened endometrium with fluid collections and debris probably due to previous endometrial ablation, but other pathologies cannot be ruled out.  5. Fibroids Multiple intramural and subserosal fibroids.  [name] MD, 3:49 PM 04/13/2020

## 2020-04-13 NOTE — Patient Instructions (Signed)
1. Metrorrhagia Irregular vaginal bleeding post endometrial ablation.  Pelvic ultrasound findings thoroughly reviewed with patient.  Very abnormal endometrial lining with irregular fluid collections and debris's probably secondary to a scarred uterine cavity status post endometrial ablation with retained blood.  Given the endometrial ablation, the intra uterine cavity cannot be well assessed.  Persistent pelvic pain and multiple fibroids.  Decision to proceed with hysterectomy.  Counseling on approaches with preference for XI Robotic total laparoscopic hysterectomy with bilateral salpingectomy.  Information and pamphlet given.  Patient voiced understanding and agreement with plan.  Preop visit by tele-visit.  2. Pelvic pain in female Persistent crampy pelvic pain.  3. S/P endometrial ablation  4. Thickened endometrium Irregular thickened endometrium with fluid collections and debris probably due to previous endometrial ablation, but other pathologies cannot be ruled out.  5. Fibroids Multiple intramural and subserosal fibroids.  Sheri Murphy, it was a pleasure seeing you today!

## 2020-04-17 ENCOUNTER — Telehealth: Payer: Self-pay

## 2020-04-17 NOTE — Telephone Encounter (Signed)
I called patient and spoke with her to let her know I received order for surgery from Dr. Marguerita Merles.  I explained that we have two block mornings a month for Robotic surgery and currently I am waiting on the July schedule to proceed with scheduling. In the meantime I will check her insurance benefits and send her a financial letter with an estimated surgery prepymt due to [place] by week before surgery.  I will call her as soon as I receive the July schedule.

## 2020-04-19 DIAGNOSIS — R102 Pelvic and perineal pain: Secondary | ICD-10-CM

## 2020-04-19 DIAGNOSIS — R1030 Lower abdominal pain, unspecified: Secondary | ICD-10-CM

## 2020-04-28 ENCOUNTER — Encounter: Payer: Self-pay | Admitting: Gynecology

## 2020-05-08 ENCOUNTER — Telehealth: Payer: Self-pay

## 2020-05-08 NOTE — Telephone Encounter (Signed)
Left message to call me to arrange July surgery as schedule now available.

## 2020-05-09 ENCOUNTER — Ambulatory Visit
Admission: RE | Admit: 2020-05-09 | Discharge: 2020-05-09 | Disposition: A | Discharge status: Home / Self Care | Payer: 59 | Source: Ambulatory Visit | Attending: Obstetrics & Gynecology | Admitting: Obstetrics & Gynecology

## 2020-05-09 DIAGNOSIS — R102 Pelvic and perineal pain: Secondary | ICD-10-CM

## 2020-05-09 DIAGNOSIS — R1030 Lower abdominal pain, unspecified: Secondary | ICD-10-CM

## 2020-05-09 MED ORDER — IOPAMIDOL (ISOVUE-300) INJECTION 61%
100.0000 mL | Freq: Once | INTRAVENOUS | Status: AC | PRN
  Administered 2020-05-09: 100 mL via INTRAVENOUS

## 2020-05-09 NOTE — Telephone Encounter (Signed)
Patient called me back. Surgery scheduled for 07/05/20.  I had earlier, prior to surgery being scheduled, sent her a financial letter. I will send an updated one with her packet.  Covid test scheduled pre op and quarantine protocol reviewed with her.  Pre op televisit will be scheduled. Appt desk to call her to arrange.

## 2020-05-24 ENCOUNTER — Telehealth: Payer: Self-pay

## 2020-05-24 NOTE — Telephone Encounter (Signed)
Patient is scheduled 07/05/20 for Robotic TLH, Bilateral salpingectomy.  She called today asking about rescheduling it 6 weeks later due to personal issues going on.    She wanted me to ask you if delaying it would create any health risk for her?

## 2020-05-25 NOTE — Telephone Encounter (Signed)
6 wks is fine.  She can inform us if any change in her symptoms.

## 2020-05-26 NOTE — Telephone Encounter (Signed)
Left message for patient that Dr. Marguerita Merles okay with that.  I told her I will hold 8/16 for her and she can call me back and let me know if that works and she still wants to change it and next week I will get her r/s'd.

## 2020-05-26 NOTE — Telephone Encounter (Signed)
Patient called back and said 08/14/20 will work great for her. I will be in touch with her soon to r/s Covid screen, pre op appt, etc. And confirm change in surgery. I am holding that time for her.

## 2020-05-30 NOTE — Telephone Encounter (Signed)
I called patient and rescheduled her surgery until August 16 at 8:30am at [facility].  Rescheduled her Covid test as well.  She wants to call back a little later to r/s office visit with Dr. Marguerita Merles as she is needing to go in a meeting. New packet will be mailed.

## 2020-06-07 ENCOUNTER — Telehealth: Payer: Self-pay

## 2020-06-07 DIAGNOSIS — Z0289 Encounter for other administrative examinations: Secondary | ICD-10-CM

## 2020-06-07 NOTE — Telephone Encounter (Signed)
Patient is scheduled for surgery on 08/14/20 and called to discuss filing disability claim. We discussed need for signed medical records release form to be on file and fee to be paid prior to me sending forms out to company. We discussed that I will fill the form out for 4 weeks out of work to start. Can return earlier if cleared sooner.  She will send release form via my [place] email and I will watch for forms from ins co via fax.

## 2020-06-27 ENCOUNTER — Encounter: Payer: Self-pay | Admitting: Anesthesiology

## 2020-06-28 ENCOUNTER — Telehealth: Payer: 59 | Admitting: Obstetrics & Gynecology

## 2020-07-01 ENCOUNTER — Other Ambulatory Visit (HOSPITAL_COMMUNITY): Payer: 59

## 2020-07-14 ENCOUNTER — Ambulatory Visit (INDEPENDENT_AMBULATORY_CARE_PROVIDER_SITE_OTHER): Payer: 59 | Admitting: Obstetrics & Gynecology

## 2020-07-14 ENCOUNTER — Encounter: Payer: Self-pay | Admitting: Obstetrics & Gynecology

## 2020-07-14 ENCOUNTER — Other Ambulatory Visit: Payer: Self-pay

## 2020-07-14 ENCOUNTER — Telehealth: Payer: Self-pay

## 2020-07-14 VITALS — BP 122/80

## 2020-07-14 DIAGNOSIS — N898 Other specified noninflammatory disorders of vagina: Secondary | ICD-10-CM

## 2020-07-14 DIAGNOSIS — N644 Mastodynia: Secondary | ICD-10-CM | POA: Diagnosis not present

## 2020-07-14 DIAGNOSIS — L723 Sebaceous cyst: Secondary | ICD-10-CM

## 2020-07-14 DIAGNOSIS — N921 Excessive and frequent menstruation with irregular cycle: Secondary | ICD-10-CM

## 2020-07-14 LAB — WET PREP FOR TRICH, YEAST, CLUE

## 2020-07-14 MED ORDER — TINIDAZOLE 500 MG PO TABS
1000.0000 mg | ORAL_TABLET | Freq: Two times a day (BID) | ORAL | 0 refills | Status: AC
Start: 2020-07-14 — End: 2020-07-16

## 2020-07-14 NOTE — Telephone Encounter (Signed)
Patient said you recommend u/s to see if surgery necessary. [facility] soonest appt is 07/26/20.  She asked if she could go to an [facility] or somewhere else for the u/s and hopefully get a sooner appt. Are you okay with that?

## 2020-07-14 NOTE — Progress Notes (Signed)
    Sheri Murphy [birth year] [id number]        53 y.o.  [id number]   RP: Many complaints including vaginal discharge, metrorrhagia and Rt breast pain  HPI: Increased vaginal discharge.  Some odor vaginally.  No current itching.  Recurrent Yeast/BV infections. C/o a small bump on the left vulva. Not currently sexually active.  Metrorrhagia s/p endometrial ablation.  Rt breast tenderness, no lump felt.     OB History  Gravida Para Term Preterm AB Living  3 0     2 0  SAB TAB Ectopic Multiple Live Births  1            # Outcome Date GA Lbr Len/2nd Weight Sex Delivery Anes PTL Lv  3 Gravida           2 AB           1 SAB             Past medical history,surgical history, problem list, medications, allergies, family history and social history were all reviewed and documented in the EPIC chart.   Directed ROS with pertinent positives and negatives documented in the history of present illness/assessment and plan.  Exam:  Vitals:   07/14/20 1112  BP: 122/80   General appearance:  Normal  Breast exam:  Normal bilaterally.  No nodule/mass.  No erythema.  No nipple d/c.  Axillae normal bilaterally.  Abdomen: Normal  Gynecologic exam: Vulva:  Small sebaceous gland cyst on the Left.  Speculum:  Cervix/Vagina normal.  Increased d/c.  Wet prep done.  Wet prep:  Clue cells present   Assessment/Plan:  54 y.o. G3P0020   1. Vaginal discharge Bacterial vaginosis confirmed by wet prep.  Will treat with tinidazole.  Usage reviewed with patient and prescription sent to pharmacy.  2. Breast pain, right Right breast tenderness and pain.  Breast exam normal otherwise.  We will proceed with a right diagnostic mammogram and ultrasound.  3. Sebaceous cyst Reassured about a small right Sebasto cyst.  Recommend warm soaking.  4. Metrorrhagia Metrorrhagia post endometrial ablation.  Follow-up with a pelvic ultrasound to rule out endometrial pathology such as polyp, submucosal myoma, endometrial  hyperplasia and endometrial cancer. - US Transvaginal Non-OB; Future  Other orders - tinidazole (TINDAMAX) 500 MG tablet; Take 2 tablets (1,000 mg total) by mouth 2 (two) times daily for 2 days.  [name] MD, 11:33 AM 07/14/2020

## 2020-07-17 ENCOUNTER — Telehealth: Payer: Self-pay | Admitting: *Deleted

## 2020-07-17 ENCOUNTER — Encounter: Payer: Self-pay | Admitting: Obstetrics & Gynecology

## 2020-07-17 DIAGNOSIS — N644 Mastodynia: Secondary | ICD-10-CM

## 2020-07-17 NOTE — Telephone Encounter (Signed)
Patient scheduled at breast center 08/01/20 @ 8:30am, patient informed.

## 2020-07-17 NOTE — Telephone Encounter (Signed)
-----   Message from [name], MD sent at 07/14/2020 11:26 AM EDT ----- Regarding: Rt Dx Mammo/US and Lt screening mammo Intermittent Rt breast pain, normal Rt breast exam.

## 2020-07-19 NOTE — Telephone Encounter (Signed)
Patient said she will keep scheduled appointment here.

## 2020-07-19 NOTE — Telephone Encounter (Signed)
Yes and schedule with me right after.

## 2020-07-26 ENCOUNTER — Encounter (HOSPITAL_COMMUNITY): Payer: Self-pay

## 2020-07-26 ENCOUNTER — Encounter: Payer: Self-pay | Admitting: Obstetrics & Gynecology

## 2020-07-26 ENCOUNTER — Ambulatory Visit (INDEPENDENT_AMBULATORY_CARE_PROVIDER_SITE_OTHER): Payer: 59

## 2020-07-26 ENCOUNTER — Ambulatory Visit (INDEPENDENT_AMBULATORY_CARE_PROVIDER_SITE_OTHER): Payer: 59 | Admitting: Obstetrics & Gynecology

## 2020-07-26 ENCOUNTER — Other Ambulatory Visit: Payer: Self-pay

## 2020-07-26 VITALS — BP 136/84

## 2020-07-26 DIAGNOSIS — N921 Excessive and frequent menstruation with irregular cycle: Secondary | ICD-10-CM

## 2020-07-26 DIAGNOSIS — N857 Hematometra: Secondary | ICD-10-CM

## 2020-07-26 DIAGNOSIS — Z9889 Other specified postprocedural states: Secondary | ICD-10-CM | POA: Diagnosis not present

## 2020-07-26 NOTE — Progress Notes (Signed)
    Sheri Murphy 11-Apr-1966 [id number]        54 y.o.  [id number]   RP: Hematometra for f/u with Pelvic US  HPI: No pelvic pain anymore.  No vaginal bleeding.  Hematometra status post endometrial ablation.   OB History  Gravida Para Term Preterm AB Living  3 0     2 0  SAB TAB Ectopic Multiple Live Births  1            # Outcome Date GA Lbr Len/2nd Weight Sex Delivery Anes PTL Lv  3 Gravida           2 AB           1 SAB             Past medical history,surgical history, problem list, medications, allergies, family history and social history were all reviewed and documented in the EPIC chart.   Directed ROS with pertinent positives and negatives documented in the history of present illness/assessment and plan.  Exam:  Vitals:   07/26/20 1009  BP: (!) 136/84   General appearance:  Normal  Pelvic US today: T/V images.  Comparison is made with previous scan on April 13, 2020.  Overall size of the uterus and fibroids is unchanged since previous scan.  The uterus is measured at 9.38 x 6.23 x 6.07 cm.  Only the 2 largest fibroids were measured today.  A total of 7 fibroids were measured on the previous scan.  Irregular endometrial fluid collection has decreased in size since the previous scan.  Previously the maximum diameter was 1.6 cm and today it is 1 to 1.1 cm.  Both ovaries are normal in size with normal follicular pattern.  A 2 cm resolving corpus luteum is seen on the left ovary.  No free fluid in the posterior cul-de-sac.   Assessment/Plan:  54 y.o. G3P0020   1. Hematometra Pelvic ultrasound findings thoroughly reviewed with patient.  Hematometra decreased in size since last ultrasound.  Uterus with fibroids stable.  Ovaries normal.  No pelvic pain.  Decision made to cancel the surgery and observe.  Will reassess if abnormal bleeding or recurrence of pain.  2. S/P endometrial ablation As above.  [name] MD, 10:43 AM 07/26/2020

## 2020-07-26 NOTE — Progress Notes (Signed)
Sent a message in mychart regarding the covid drive- thru location change. 

## 2020-07-26 NOTE — Progress Notes (Signed)
DUE TO COVID-19 ONLY ONE VISITOR IS ALLOWED TO COME WITH YOU AND STAY IN THE WAITING ROOM ONLY DURING PRE OP AND PROCEDURE DAY OF SURGERY. THE 1 VISITOR MAY VISIT WITH YOU AFTER SURGERY IN YOUR PRIVATE ROOM DURING VISITING HOURS ONLY!  YOU NEED TO HAVE A COVID 19 TEST ON__8/12/21 ____ @_315pm_____ , THIS TEST MUST BE DONE BEFORE SURGERY, COME  [address], [place] , [id number].  [facility]) , COVID TESTING SITE [place] [address], IT IS ON THE RIGHT GOING OUT [address] APPROXITAM[address]TELY 2 MINUTES PAST [employer] ON THE RIGHT. ONCE YOUR COVID TEST IS COMPLETED,  PLEASE BEGIN THE QUARANTINE INSTRUCTIONS AS OUTLINED IN YOUR HANDOUT.                Rayan Dyal  07/26/2020   Your procedure is scheduled on:  08/14/2020   Report to [facility] Main  Entrance   Report to admitting at    [place] AM     Call this number if you have problems the morning of surgery [phone]    Remember: Do not eat food   :After Midnight. BRUSH YOUR TEETH MORNING OF SURGERY AND RINSE YOUR MOUTH OUT, NO CHEWING GUM CANDY OR MINTS.   NO SOLID FOOD AFTER MIDNIGHT THE NIGHT PRIOR TO SURGERY. NOTHING BY MOUTH EXCEPT CLEAR LIQUIDS UNTIL   0530am  . PLEASE FINISH ENSURE DRINK PER SURGEON ORDER  WHICH NEEDS TO BE COMPLETED AT . 0530am   CLEAR LIQUID DIET   Foods Allowed                                                    Coffee and tea, regular and decaf                             Plain Jell-O any favor except red or purple                                           Fruit ices (not with fruit pulp)                                     Iced Popsicles                                     Carbonated beverages, regular and diet                                    Cranberry, grape and apple juices Sports drinks like Gatorade Lightly seasoned clear broth or consume(fat free) Sugar, honey syrup  May take the following medications the morning of surgery with  a sip of water:  Zyrtec, Prilosec NO DIABETIC MEDS AM OF SURGERY.  _____________________________________________________________________  [facility] - Preparing for Surgery Before surgery, you can play an important role.  Because skin is not sterile, your skin needs to be as free of germs as possible.  You can reduce the number of germs on your skin by washing with CHG (chlorahexidine gluconate) soap before surgery.  CHG is an antiseptic cleaner which kills germs and bonds with the skin to continue killing germs even after washing. Please DO NOT use if you have an allergy to CHG or antibacterial soaps.  If your skin becomes reddened/irritated stop using the CHG and inform your nurse when you arrive at Short Stay. Do not shave (including legs and underarms) for at least 48 hours prior to the first CHG shower.  You may shave your face/neck. Please follow these instructions carefully:  1.  Shower with CHG Soap the night before surgery and the  morning of Surgery.  2.  If you choose to wash your hair, wash your hair first as usual with your  normal  shampoo.  3.  After you shampoo, rinse your hair and body thoroughly to remove the  shampoo.                           4.  Use CHG as you would any other liquid soap.  You can apply chg directly  to the skin and wash                       Gently with a scrungie or clean washcloth.  5.  Apply the CHG Soap to your body ONLY FROM THE NECK DOWN.   Do not use on face/ open                           Wound or open sores. Avoid contact with eyes, ears mouth and genitals (private parts).                       Wash face,  Genitals (private parts) with your normal soap.             6.  Wash thoroughly, paying special attention to the area where your surgery  will be performed.  7.  Thoroughly rinse your body with  warm water from the neck down.  8.  DO NOT shower/wash with your normal soap after using and rinsing off  the CHG Soap.                9.  Pat yourself dry with a clean towel.            10.  Wear clean pajamas.            11.  Place clean sheets on your bed the night of your first shower and do not  sleep with pets. Day of Surgery : Do not apply any lotions/deodorants the morning of surgery.  Please wear clean clothes to the hospital/surgery center.  FAILURE TO FOLLOW THESE INSTRUCTIONS MAY RESULT IN THE CANCELLATION OF YOUR SURGERY PATIENT SIGNATURE_________________________________  NURSE SIGNATURE__________________________________  ________________________________________________________________________   Adam Phenix  An incentive spirometer is a tool that can help keep your lungs clear and active. This tool measures how well you are filling your lungs with each breath. Taking long deep breaths may help reverse or decrease the chance of developing breathing (pulmonary) problems (especially infection) following:  A long period  of time when you are unable to move or be active. BEFORE THE PROCEDURE   If the spirometer includes an indicator to show your best effort, your nurse or respiratory therapist will set it to a desired goal.  If possible, sit up straight or lean slightly forward. Try not to slouch.  Hold the incentive spirometer in an upright position. INSTRUCTIONS FOR USE  1. Sit on the edge of your bed if possible, or sit up as far as you can in bed or on a chair. 2. Hold the incentive spirometer in an upright position. 3. Breathe out normally. 4. Place the mouthpiece in your mouth and seal your lips tightly around it. 5. Breathe in slowly and as deeply as possible, raising the piston or the ball toward the top of the column. 6. Hold your breath for 3-5 seconds or for as long as possible. Allow the piston or ball to fall to the bottom of the column. 7. Remove the  mouthpiece from your mouth and breathe out normally. 8. Rest for a few seconds and repeat Steps 1 through 7 at least 10 times every 1-2 hours when you are awake. Take your time and take a few normal breaths between deep breaths. 9. The spirometer may include an indicator to show your best effort. Use the indicator as a goal to work toward during each repetition. 10. After each set of 10 deep breaths, practice coughing to be sure your lungs are clear. If you have an incision (the cut made at the time of surgery), support your incision when coughing by placing a pillow or rolled up towels firmly against it. Once you are able to get out of bed, walk around indoors and cough well. You may stop using the incentive spirometer when instructed by your caregiver.  RISKS AND COMPLICATIONS  Take your time so you do not get dizzy or light-headed.  If you are in pain, you may need to take or ask for pain medication before doing incentive spirometry. It is harder to take a deep breath if you are having pain. AFTER USE  Rest and breathe slowly and easily.  It can be helpful to keep track of a log of your progress. Your caregiver can provide you with a simple table to help with this. If you are using the spirometer at home, follow these instructions: [place] IF:   You are having difficultly using the spirometer.  You have trouble using the spirometer as often as instructed.  Your pain medication is not giving enough relief while using the spirometer.  You develop fever of 100.5 F (38.1 C) or higher. SEEK IMMEDIATE MEDICAL CARE IF:   You cough up bloody sputum that had not been present before.  You develop fever of 102 F (38.9 C) or greater.  You develop worsening pain at or near the incision site. MAKE SURE YOU:   Understand these instructions.  Will watch your condition.  Will get help right away if you are not doing well or get worse. Document Released: 04/28/2007 Document  Revised: 03/09/2012 Document Reviewed: 06/29/2007 ExitCare Patient Information 2014 ExitCare, [place].   ________________________________________________________________________  WHAT IS A BLOOD TRANSFUSION? Blood Transfusion Information  A transfusion is the replacement of blood or some of its parts. Blood is made up of multiple cells which provide different functions.  Red blood cells carry oxygen and are used for blood loss replacement.  White blood cells fight against infection.  Platelets control bleeding.  Plasma helps clot  blood.  Other blood products are available for specialized needs, such as hemophilia or other clotting disorders. BEFORE THE TRANSFUSION  Who gives blood for transfusions?   Healthy volunteers who are fully evaluated to make sure their blood is safe. This is blood bank blood. Transfusion therapy is the safest it has ever been in the practice of medicine. Before blood is taken from a donor, a complete history is taken to make sure that person has no history of diseases nor engages in risky social behavior (examples are intravenous drug use or sexual activity with multiple partners). The donor's travel history is screened to minimize risk of transmitting infections, such as malaria. The donated blood is tested for signs of infectious diseases, such as HIV and hepatitis. The blood is then tested to be sure it is compatible with you in order to minimize the chance of a transfusion reaction. If you or a relative donates blood, this is often done in anticipation of surgery and is not appropriate for emergency situations. It takes many days to process the donated blood. RISKS AND COMPLICATIONS Although transfusion therapy is very safe and saves many lives, the main dangers of transfusion include:   Getting an infectious disease.  Developing a transfusion reaction. This is an allergic reaction to something in the blood you were given. Every precaution is taken to prevent  this. The decision to have a blood transfusion has been considered carefully by your caregiver before blood is given. Blood is not given unless the benefits outweigh the risks. AFTER THE TRANSFUSION  Right after receiving a blood transfusion, you will usually feel much better and more energetic. This is especially true if your red blood cells have gotten low (anemic). The transfusion raises the level of the red blood cells which carry oxygen, and this usually causes an energy increase.  The nurse administering the transfusion will monitor you carefully for complications. HOME CARE INSTRUCTIONS  No special instructions are needed after a transfusion. You may find your energy is better. Speak with your caregiver about any limitations on activity for underlying diseases you may have. SEEK MEDICAL CARE IF:   Your condition is not improving after your transfusion.  You develop redness or irritation at the intravenous (IV) site. SEEK IMMEDIATE MEDICAL CARE IF:  Any of the following symptoms occur over the next 12 hours:  Shaking chills.  You have a temperature by mouth above 102 F (38.9 C), not controlled by medicine.  Chest, back, or muscle pain.  People around you feel you are not acting correctly or are confused.  Shortness of breath or difficulty breathing.  Dizziness and fainting.  You get a rash or develop hives.  You have a decrease in urine output.  Your urine turns a dark color or changes to pink, red, or brown. Any of the following symptoms occur over the next 10 days:  You have a temperature by mouth above 102 F (38.9 C), not controlled by medicine.  Shortness of breath.  Weakness after normal activity.  The white part of the eye turns yellow (jaundice).  You have a decrease in the amount of urine or are urinating less often.  Your urine turns a dark color or changes to pink, red, or brown. Document Released: 12/13/2000 Document Revised: 03/09/2012 Document  Reviewed: 08/01/2008 [facility] Patient Information 2014 ExitCare, [place].  _______________________________________________________________________  Take these medicines the morning of surgery with A SIP OF WATER:   DO NOT TAKE ANY DIABETIC MEDICATIONS DAY OF YOUR SURGERY  You may not have any metal on your body including hair pins and              piercings  Do not wear jewelry, make-up, lotions, powders or perfumes, deodorant             Do not wear nail polish on your fingernails.  Do not shave  48 hours prior to surgery.              Men may shave face and neck.   Do not bring valuables to the hospital. [place].  Contacts, dentures or bridgework may not be worn into surgery.  Leave suitcase in the car. After surgery it may be brought to your room.     Patients discharged the day of surgery will not be allowed to drive home. IF YOU ARE HAVING SURGERY AND GOING HOME THE SAME DAY, YOU MUST HAVE AN ADULT TO DRIVE YOU HOME AND BE WITH YOU FOR 24 HOURS. YOU MAY GO HOME BY TAXI OR UBER OR ORTHERWISE, BUT AN ADULT MUST ACCOMPANY YOU HOME AND STAY WITH YOU FOR 24 HOURS.  Name and phone number of your driver:  Special Instructions: N/A              Please read over the following fact sheets you were given: _____________________________________________________________________

## 2020-07-27 ENCOUNTER — Telehealth: Payer: Self-pay

## 2020-07-27 NOTE — Telephone Encounter (Signed)
Patient called in voice mail stating she had u/s yesterday and that she no longer needs to have surgery. Before I cancelled it just wanted to check with you.

## 2020-07-28 NOTE — Telephone Encounter (Signed)
I spoke with Dr. Marguerita Merles and confirmed case to be cancelled.  Case cancelled with Delilah Shan in [place] scheduling. I cancelled her Covid test appt and pre op visit with ML.  I cancelled the PA with [facility] as well.  I spoke with patient and let her know I would take care of all this.

## 2020-07-29 ENCOUNTER — Encounter: Payer: Self-pay | Admitting: Obstetrics & Gynecology

## 2020-08-01 ENCOUNTER — Other Ambulatory Visit: Payer: 59

## 2020-08-07 ENCOUNTER — Ambulatory Visit: Payer: 59 | Admitting: Obstetrics & Gynecology

## 2020-08-07 ENCOUNTER — Inpatient Hospital Stay (HOSPITAL_COMMUNITY): Admission: RE | Admit: 2020-08-07 | Payer: 59 | Source: Ambulatory Visit

## 2020-08-10 ENCOUNTER — Inpatient Hospital Stay (HOSPITAL_COMMUNITY)
Admission: RE | Admit: 2020-08-10 | Discharge: 2020-08-10 | Disposition: A | Discharge status: Home / Self Care | Payer: 59 | Source: Ambulatory Visit

## 2020-08-14 ENCOUNTER — Ambulatory Visit
Admission: RE | Admit: 2020-08-14 | Discharge: 2020-08-14 | Disposition: A | Discharge status: Home / Self Care | Payer: 59 | Source: Ambulatory Visit | Attending: Obstetrics & Gynecology | Admitting: Obstetrics & Gynecology

## 2020-08-14 ENCOUNTER — Other Ambulatory Visit: Payer: Self-pay

## 2020-08-14 ENCOUNTER — Other Ambulatory Visit: Payer: Self-pay | Admitting: Obstetrics & Gynecology

## 2020-08-14 ENCOUNTER — Encounter (HOSPITAL_BASED_OUTPATIENT_CLINIC_OR_DEPARTMENT_OTHER): Payer: Self-pay

## 2020-08-14 ENCOUNTER — Ambulatory Visit (HOSPITAL_BASED_OUTPATIENT_CLINIC_OR_DEPARTMENT_OTHER): Admit: 2020-08-14 | Payer: 59 | Admitting: Obstetrics & Gynecology

## 2020-08-14 DIAGNOSIS — N644 Mastodynia: Secondary | ICD-10-CM

## 2020-08-14 SURGERY — HYSTERECTOMY, TOTAL, ROBOT-ASSISTED, LAPAROSCOPIC, WITH BILATERAL SALPINGO-OOPHORECTOMY
Anesthesia: General | Laterality: Bilateral

## 2020-08-22 IMAGING — MG DIGITAL DIAGNOSTIC BILAT W/ TOMO W/ CAD
6 of 12 series · 6 of 36 positions shown · non-contrast
Comparison: Previous exam(s).

CLINICAL DATA: 53-year-old female presenting with intermittent pain
throughout the medial aspect of the right breast.

EXAM:
DIGITAL DIAGNOSTIC BILATERAL MAMMOGRAM WITH CAD AND TOMO
ULTRASOUND RIGHT BREAST

[L CC synth-2D]
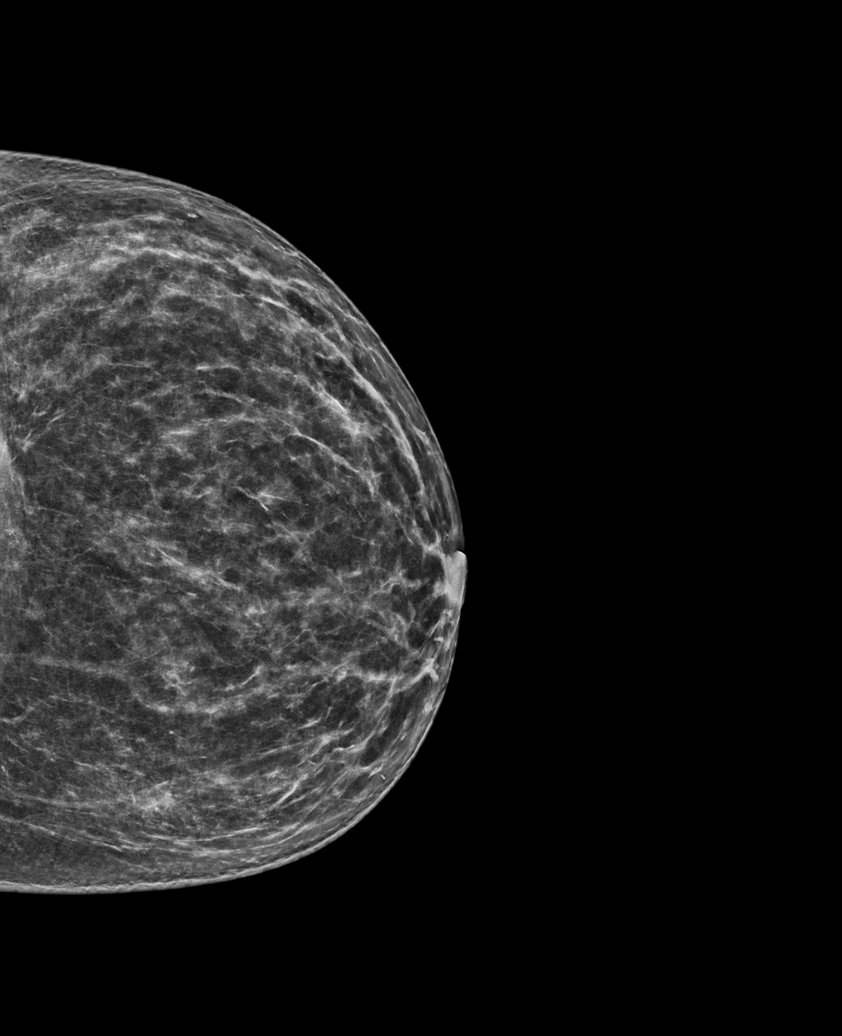

[R CC synth-2D]
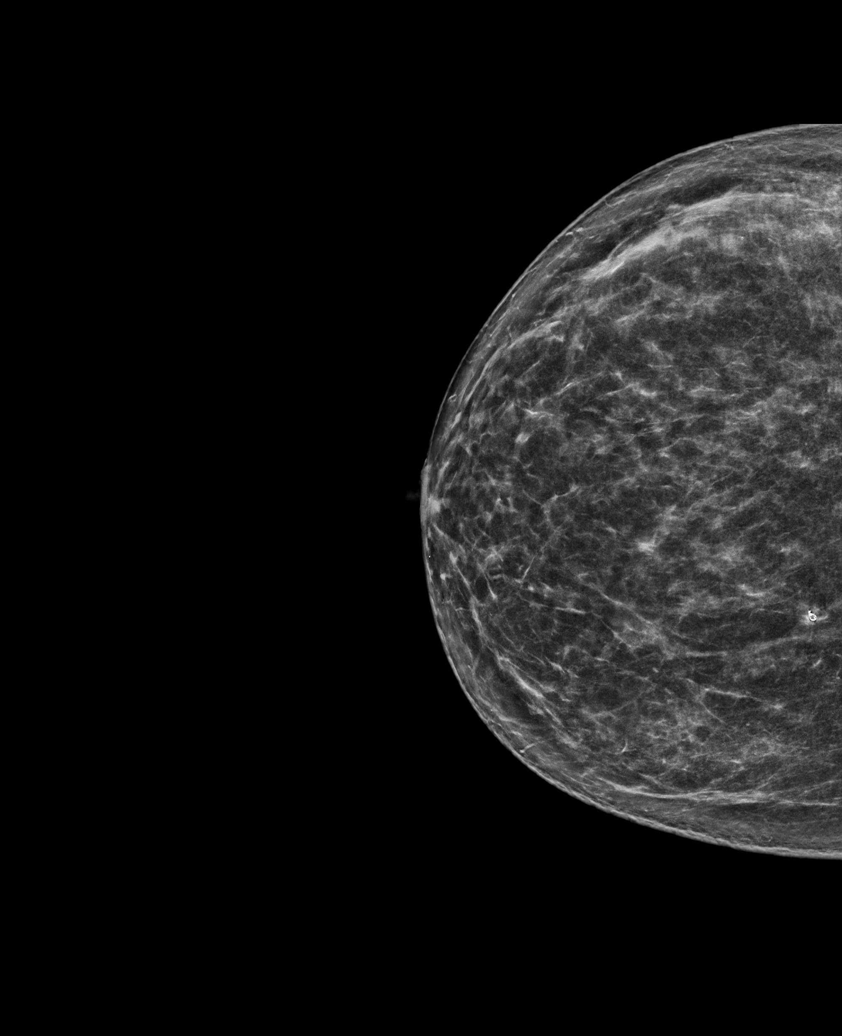

[L MLO synth-2D]
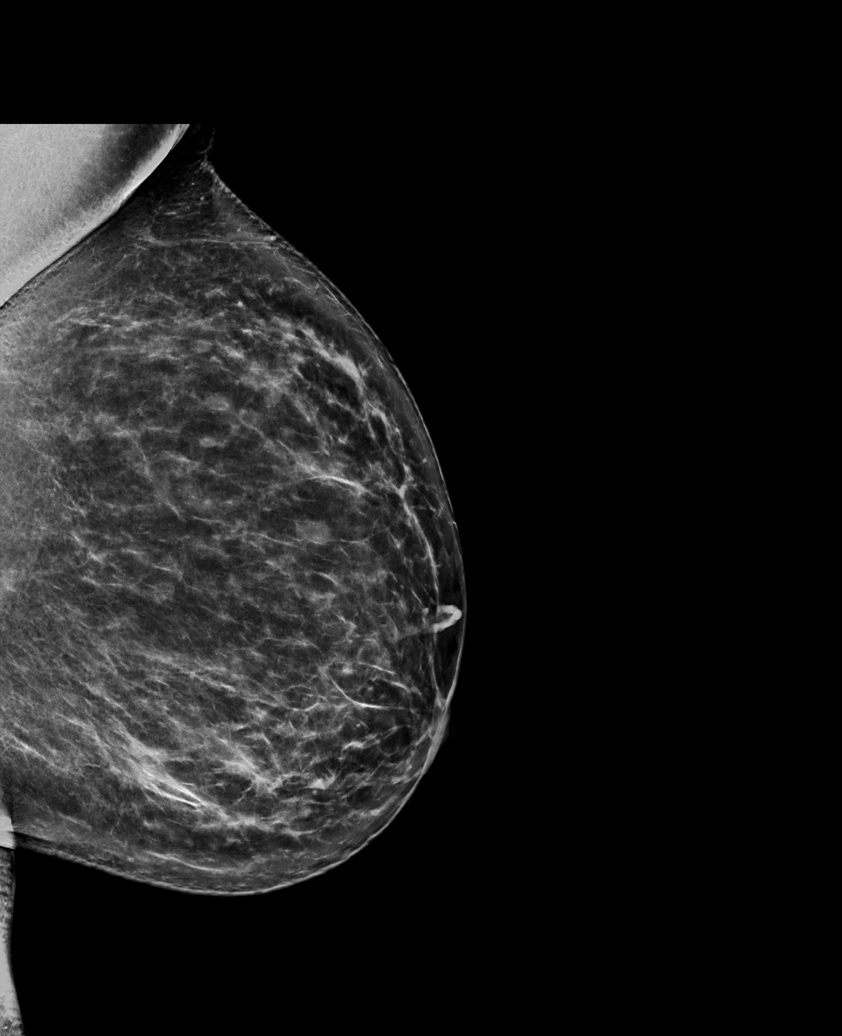

[R MLO synth-2D (1 of 2)]
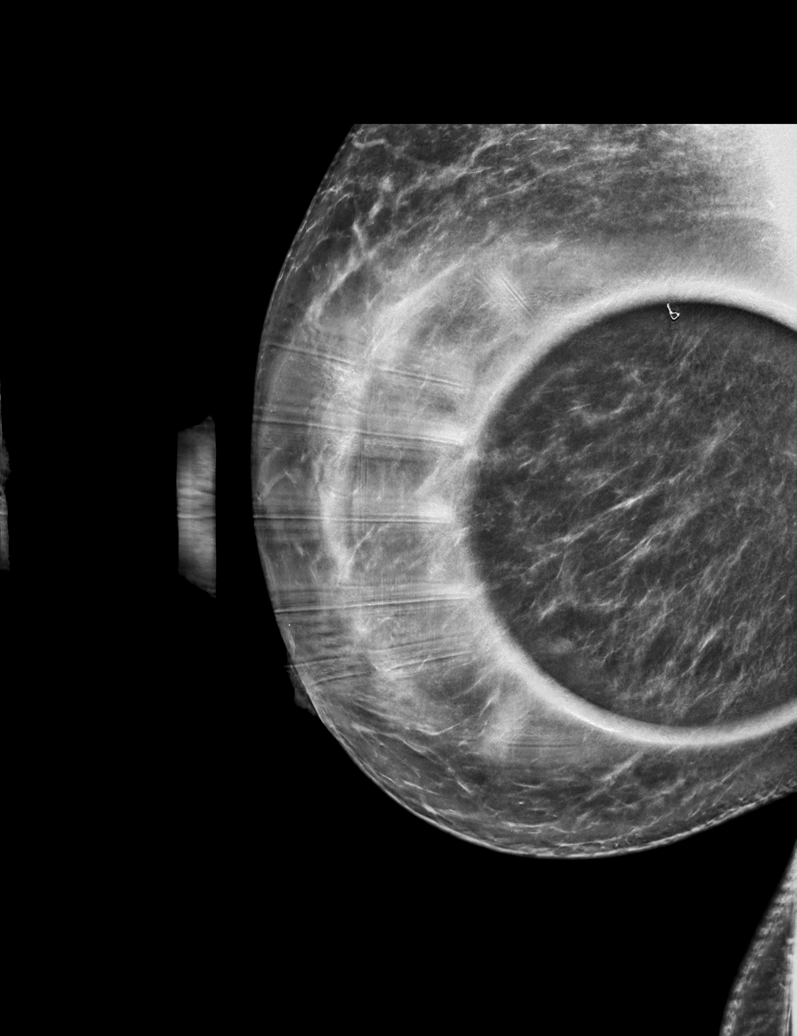

[R TAN synth-2D]
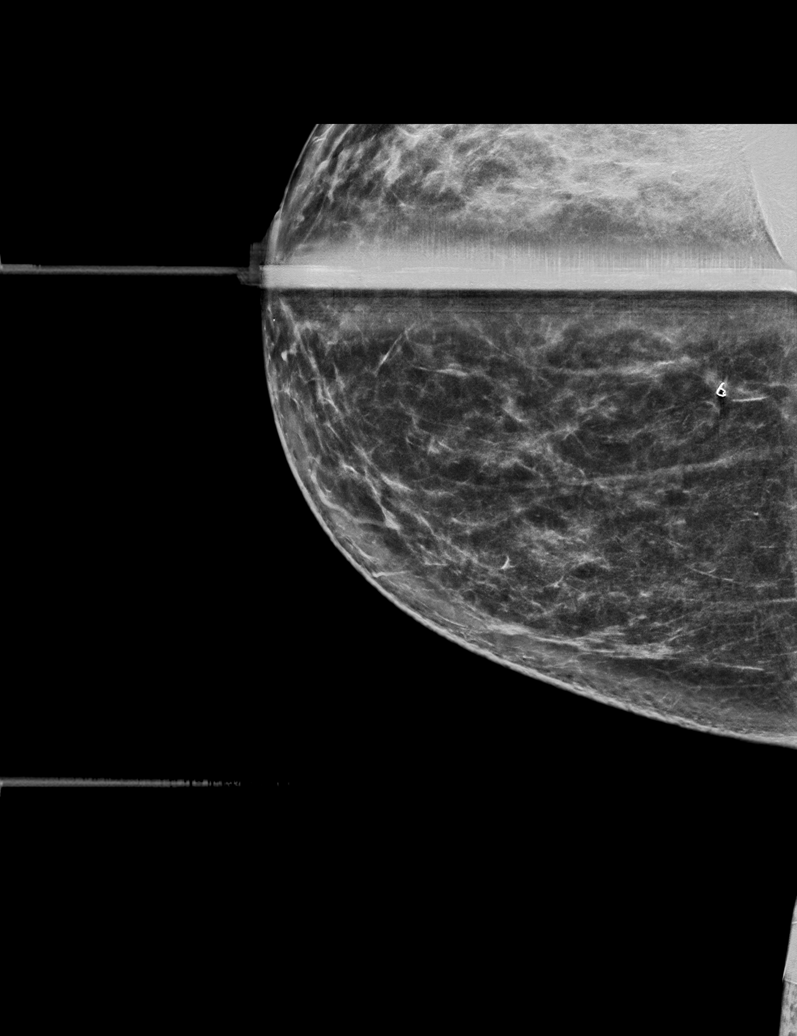

[R MLO synth-2D (2 of 2)]
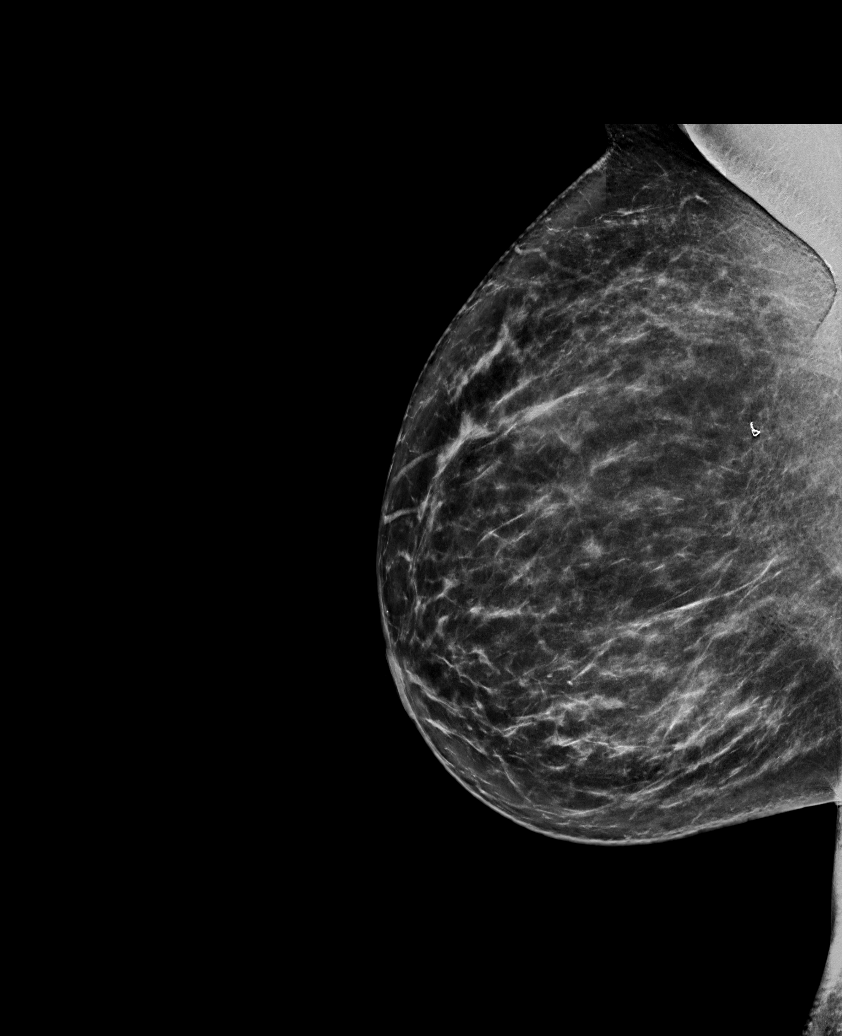

[6 of 36 positions shown; findings below may reference images not displayed]

ACR Breast Density Category c: The breast tissue is heterogeneously
dense, which may obscure small masses.
FINDINGS: Mammogram:

Right breast: Spot compression tomosynthesis views were performed in
addition to standard views. In the medial right breast there is an
oval 1.1 cm mass which persists on the spot compression views. There
are no additional new findings in the right breast.

Left breast: No suspicious mass, distortion, or microcalcifications
are identified to suggest presence of malignancy.

Mammographic images were processed with CAD.

Ultrasound:

Targeted ultrasound is performed throughout the medial aspect of the
right breast demonstrating no discrete cystic or solid mass to
correspond to the mass identified mammographically.

Targeted ultrasound of the right axilla demonstrates one lymph node
with cortical thickening measuring 5 mm.
IMPRESSION: 1. Indeterminate right breast mass medially without sonographic
correlate.

2. Indeterminate right axillary lymph node with cortical thickening.

RECOMMENDATION:
1.  Stereotactic core needle biopsy of the right breast mass.

2. Ultrasound-guided core needle biopsy of the right axillary lymph
node.

I have discussed the findings and recommendations with the patient.
The patient will be scheduled for biopsy prior to leaving our office
today.

BI-RADS CATEGORY  4: Suspicious.

## 2020-08-23 ENCOUNTER — Ambulatory Visit
Admission: RE | Admit: 2020-08-23 | Discharge: 2020-08-23 | Disposition: A | Discharge status: Home / Self Care | Payer: 59 | Source: Ambulatory Visit | Attending: Obstetrics & Gynecology | Admitting: Obstetrics & Gynecology

## 2020-08-23 ENCOUNTER — Other Ambulatory Visit: Payer: Self-pay

## 2020-08-23 DIAGNOSIS — N644 Mastodynia: Secondary | ICD-10-CM

## 2020-10-03 ENCOUNTER — Encounter: Payer: Self-pay | Admitting: Obstetrics & Gynecology

## 2020-10-03 ENCOUNTER — Ambulatory Visit (INDEPENDENT_AMBULATORY_CARE_PROVIDER_SITE_OTHER): Payer: 59 | Admitting: Obstetrics & Gynecology

## 2020-10-03 ENCOUNTER — Other Ambulatory Visit: Payer: Self-pay

## 2020-10-03 VITALS — BP 124/80

## 2020-10-03 DIAGNOSIS — N857 Hematometra: Secondary | ICD-10-CM | POA: Diagnosis not present

## 2020-10-03 DIAGNOSIS — R319 Hematuria, unspecified: Secondary | ICD-10-CM

## 2020-10-03 DIAGNOSIS — N926 Irregular menstruation, unspecified: Secondary | ICD-10-CM | POA: Diagnosis not present

## 2020-10-03 DIAGNOSIS — Z9889 Other specified postprocedural states: Secondary | ICD-10-CM | POA: Diagnosis not present

## 2020-10-03 DIAGNOSIS — R102 Pelvic and perineal pain: Secondary | ICD-10-CM

## 2020-10-03 LAB — FOLLICLE STIMULATING HORMONE: FSH: 24.9 m[IU]/mL

## 2020-10-03 MED ORDER — NORETHINDRONE 0.35 MG PO TABS: 1.0000 | ORAL_TABLET | Freq: Every day | ORAL | 4 refills | Status: DC

## 2020-10-03 MED ORDER — IBUPROFEN 600 MG PO TABS
600.0000 mg | ORAL_TABLET | Freq: Three times a day (TID) | ORAL | 3 refills | Status: DC | PRN
Start: 2020-10-03 — End: 2020-12-21

## 2020-10-03 NOTE — Progress Notes (Signed)
    Sheri Murphy [birth year] [id number]        54 y.o.  G3P0020   RP: Menstrual like light vaginal bleeding with severe cramping x 5 days  HPI: S/P Endometrial Ablation.  Small hematometra decreased on Pelvic US from April to July 2021.  Light vaginal bleeding with severe cramping for about 5 days.  Last menstrual like bleeding before that was 03/2020.  Blood in urine.  No UTI Sxs otherwise.  BMs normal.   OB History  Gravida Para Term Preterm AB Living  3 0     2 0  SAB TAB Ectopic Multiple Live Births  1            # Outcome Date GA Lbr Len/2nd Weight Sex Delivery Anes PTL Lv  3 Gravida           2 AB           1 SAB             Past medical history,surgical history, problem list, medications, allergies, family history and social history were all reviewed and documented in the EPIC chart.   Directed ROS with pertinent positives and negatives documented in the history of present illness/assessment and plan.  Exam:  Vitals:   10/03/20 1217  BP: 124/80   General appearance:  Normal CVAT Negative bilaterally  Abdomen: Normal  Gynecologic exam: Vulva normal.  Speculum:  Brownish blood at [employer].  Bimanual exam:  Uterus AV, mobile, normal volume, NT.  No adnexal mass, NT.  U/A: Yellow clear, protein negative, nitrites negative, white blood cells 0-5, red blood cells 3-10, bacteria negative.  Urine culture pending.   Assessment/Plan:  54 y.o. G3P0020   1. Irregular menses Vaginal bleeding probably corresponding to oligomenorrhea of perimenopause status post endometrial ablation.  Rule out endometrial lesion with abnormal bleeding.  Patient will follow up with a pelvic ultrasound.  Mentor drawn today. - FSH - US Transvaginal Non-OB; Future  2. S/P endometrial ablation Vaginal bleeding post endometrial ablation.  3. Pelvic pain in female Pelvic pain especially with vaginal bleeding.  Follow-up pelvic ultrasound. - US Transvaginal Non-OB; Future  4. Hematometra History  of hematometra probably contributing significantly to dysmenorrhea.  5. Hematuria, unspecified type Very mild perturbation of urine analysis.  Will wait on urine culture to decide if treatment needed. - Urinalysis,Complete w/RFL Culture  Other orders - norethindrone (MICRONOR) 0.35 MG tablet; Take 1 tablet (0.35 mg total) by mouth daily. - ibuprofen (ADVIL) 600 MG tablet; Take 1 tablet (600 mg total) by mouth every 8 (eight) hours as needed.  [name] MD, 12:35 PM 10/03/2020

## 2020-10-04 ENCOUNTER — Encounter: Payer: Self-pay | Admitting: Obstetrics & Gynecology

## 2020-10-04 DIAGNOSIS — G4733 Obstructive sleep apnea (adult) (pediatric): Secondary | ICD-10-CM | POA: Insufficient documentation

## 2020-10-05 LAB — URINALYSIS, COMPLETE W/RFL CULTURE
Bacteria, UA: NONE SEEN /HPF
Bilirubin Urine: NEGATIVE
Glucose, UA: NEGATIVE
Hyaline Cast: NONE SEEN /LPF
Ketones, ur: NEGATIVE
Leukocyte Esterase: NEGATIVE
Nitrites, Initial: NEGATIVE
Protein, ur: NEGATIVE
Specific Gravity, Urine: 1.015 (ref 1.001–1.03)
pH: 6 (ref 5.0–8.0)

## 2020-10-05 LAB — URINE CULTURE
MICRO NUMBER:: 11033094
Result:: NO GROWTH
SPECIMEN QUALITY:: ADEQUATE

## 2020-10-05 LAB — CULTURE INDICATED

## 2020-11-02 ENCOUNTER — Ambulatory Visit (INDEPENDENT_AMBULATORY_CARE_PROVIDER_SITE_OTHER): Payer: 59

## 2020-11-02 ENCOUNTER — Ambulatory Visit (INDEPENDENT_AMBULATORY_CARE_PROVIDER_SITE_OTHER): Payer: 59 | Admitting: Obstetrics & Gynecology

## 2020-11-02 ENCOUNTER — Other Ambulatory Visit: Payer: Self-pay

## 2020-11-02 DIAGNOSIS — N857 Hematometra: Secondary | ICD-10-CM

## 2020-11-02 DIAGNOSIS — D251 Intramural leiomyoma of uterus: Secondary | ICD-10-CM | POA: Diagnosis not present

## 2020-11-02 DIAGNOSIS — N951 Menopausal and female climacteric states: Secondary | ICD-10-CM | POA: Diagnosis not present

## 2020-11-02 DIAGNOSIS — D252 Subserosal leiomyoma of uterus: Secondary | ICD-10-CM

## 2020-11-02 DIAGNOSIS — N854 Malposition of uterus: Secondary | ICD-10-CM

## 2020-11-02 DIAGNOSIS — N926 Irregular menstruation, unspecified: Secondary | ICD-10-CM

## 2020-11-02 DIAGNOSIS — Z9889 Other specified postprocedural states: Secondary | ICD-10-CM | POA: Diagnosis not present

## 2020-11-02 DIAGNOSIS — R102 Pelvic and perineal pain: Secondary | ICD-10-CM

## 2020-11-02 NOTE — Progress Notes (Signed)
    Sheri Murphy [birth year] [id number]        54 y.o.  G3P0020   RP:  Irregular vaginal bleeding improved on the Progestin pill for repeat Pelvic US  HPI: Well on the Progestin pill x 10/03/2020.  On 10/03/2020 we noted:  S/P Endometrial Ablation.  Small hematometra decreased on Pelvic US from April to July 2021.  Light vaginal bleeding with severe cramping for about 5 days.  Last menstrual like bleeding before that was 03/2020.  Blood in urine.  No UTI Sxs otherwise.  BMs normal.   OB History  Gravida Para Term Preterm AB Living  3 0     2 0  SAB TAB Ectopic Multiple Live Births  1            # Outcome Date GA Lbr Len/2nd Weight Sex Delivery Anes PTL Lv  3 Gravida           2 AB           1 SAB             Past medical history,surgical history, problem list, medications, allergies, family history and social history were all reviewed and documented in the EPIC chart.   Directed ROS with pertinent positives and negatives documented in the history of present illness/assessment and plan.  Exam:  There were no vitals filed for this visit. General appearance:  Normal  Pelvic US today: T/V images.  Anteverted uterus irregular and enlarged with multiple fibroids the largest of which is measured at 3.4 cm intramural and subserosal.  The overall uterine size is measured at 9.43 x 5.56 x 4.67 cm.  Distorted endometrium due to history of endometrial ablation with avascular areas of fluid previously seen on ultrasound August 2021 and April 2021, now decreased in size at the right aspect of the endometrial cavity.  Both ovaries are normal in size with a right ovarian collapsed corpus luteum cyst measured at 9 mm.  No adnexal mass.  No free fluid in the posterior cul-de-sac.   Assessment/Plan:  54 y.o. G3P0020   1. Perimenopause Improved on the Progestin pill with less pelvic cramping and bleeding, as well as decreased size of the hematometra on pelvic ultrasound today.  Patient reassured.   Decision made to continue on the progestin pill.  2. S/P endometrial ablation  3. Hematometra Small hematometra, decreased in size compared to the last pelvic ultrasound August 2021.  Patient reassured.  [name] MD, 12:58 PM 11/02/2020

## 2020-11-03 ENCOUNTER — Encounter: Payer: Self-pay | Admitting: Obstetrics & Gynecology

## 2020-12-21 ENCOUNTER — Other Ambulatory Visit: Payer: Self-pay | Admitting: Obstetrics & Gynecology

## 2021-01-10 DIAGNOSIS — Z20822 Contact with and (suspected) exposure to covid-19: Secondary | ICD-10-CM | POA: Diagnosis not present

## 2021-01-16 DIAGNOSIS — F411 Generalized anxiety disorder: Secondary | ICD-10-CM | POA: Diagnosis not present

## 2021-01-16 DIAGNOSIS — F339 Major depressive disorder, recurrent, unspecified: Secondary | ICD-10-CM | POA: Diagnosis not present

## 2021-01-27 DIAGNOSIS — F411 Generalized anxiety disorder: Secondary | ICD-10-CM | POA: Diagnosis not present

## 2021-01-27 DIAGNOSIS — F339 Major depressive disorder, recurrent, unspecified: Secondary | ICD-10-CM | POA: Diagnosis not present

## 2021-03-15 NOTE — Progress Notes (Signed)
[facility] New Patient Evaluation and Consultation  Referring Provider: Bartholome Bill, MD PCP: Bartholome Bill, MD Date of Service: 03/21/2021  SUBJECTIVE Chief Complaint: New Patient (Initial Visit) - urinary leakage  History of Present Illness: Sheri Murphy is a 55 y.o. Black or African-American female presenting for evaluation of incontinence.     Urinary Symptoms: Leaks urine with cough/ sneeze, laughing, going from sitting to standing and with urgency  Has been going on a few years but has worsening- now has more urgency on the way to the bathroom. Amount of urine leakage has been increasing. UUI =SUI Leaks 2-3 time(s) per day.  Pad use: 2 pads per day.   She is bothered by her UI symptoms.  Day time voids 4.  Nocturia: 1 times per night to void. Voiding dysfunction: she empties her bladder well.  does not use a catheter to empty bladder.  When urinating, she feels dribbling after finishing Drinks: 1 cup coffee, 4 cups water, iced tea (unsweet)  per day  UTIs: 2 UTI's in the last year.   Reports history of blood in urine, also had a "spot" removed from left kidney with cryoablation about 3 years ago that showed renal cell carcinoma (went to [facility]).  Has yearly MRI kidneys.  Usually will show blood in urine.   Pelvic Organ Prolapse Symptoms:                  She Denies a feeling of a bulge the vaginal area.   Bowel Symptom: Bowel movements: 1 time(s) per day Stool consistency: hard, soft  or loose  Has IBS Straining: yes, and has to rock side to side Splinting: no.  Incomplete evacuation: no.  She Denies accidental bowel leakage / fecal incontinence Last colonoscopy: Date 2020, Results normal  Sexual Function Sexually active: yes.  Sexual orientation: heterosexual Pain with sex: Yes, deep in the pelvis  Pelvic Pain Admits to pelvic pain Location: 2-3 in below the navel Pain occurs: throughout the month Prior pain  treatment: taking progestin Improved by: progestin Worsened by: not sure   Past Medical History:  Past Medical History:  Diagnosis Date  . Abnormal liver function 01/08/2016  . Anemia   . Anxiety   . Controlled type 2 diabetes mellitus without complication ([place])   . Fatty liver   . Fibroids   . Fibromyalgia   . GERD (gastroesophageal reflux disease)   . Heart murmur   . Hematuria   . Hypertension   . IBS (irritable bowel syndrome)   . Insomnia   . Menorrhagia   . Obesity   . Renal cell carcinoma, left ([facility])    surgery only chemo/radiation not required     Past Surgical History:   Past Surgical History:  Procedure Laterality Date  . BREAST BIOPSY Right   . COLONOSCOPY    . DILATATION & CURETTAGE/HYSTEROSCOPY WITH MYOSURE N/A 05/13/2018   Procedure: DILATATION & CURETTAGE/HYSTEROSCOPY WITH NOVASURE;  Surgeon: [name], MD;  Location: [place];  Service: Gynecology;  Laterality: N/A;  . Left renal mass removal  07/2017   Thermal Microwave Ablation  . SKIN LESION EXCISION    . WISDOM TOOTH EXTRACTION       Past OB/GYN History: G2 P2 Vaginal deliveries: 2,  Forceps/ Vacuum deliveries: 0, Cesarean section: 0 Had an ablation (novasure)- has not had periods since then, has had some spotting.  Last pap smear was 2020- negative.  Any history of abnormal pap smears:  yes- at age 82 and had cryosurgery but no abnormal since then.    Medications: She has a current medication list which includes the following prescription(s): acetaminophen, bisoprolol-hydrochlorothiazide, cetirizine, cholecalciferol, glipizide, ibuprofen, metformin, omeprazole, solifenacin, and norethindrone.   Allergies: Patient is allergic to amitriptyline, chlordiazepoxide, and macrodantin [nitrofurantoin].   Social History:  Social History   Tobacco Use  . Smoking status: Never Smoker  . Smokeless tobacco: Never Used  Vaping Use  . Vaping Use: Never used  Substance Use  Topics  . Alcohol use: Yes    Comment: occational  . 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 Regular exercise: No  Family History:   Family History  Problem Relation Age of Onset  . Cancer Mother 61       COLON  . Hypertension Mother   . Hypertension Father   . Hypertension Maternal Aunt   . Cancer Maternal Grandmother        small intestine   . Hypertension Maternal Grandmother   . Cancer Maternal Grandfather        pancreatic  . Diabetes Paternal Grandmother      Review of Systems: Review of Systems  Constitutional: Positive for malaise/fatigue. Negative for fever and weight loss.  Respiratory: Negative for cough, shortness of breath and wheezing.   Cardiovascular: Negative for chest pain, palpitations and leg swelling.  Gastrointestinal: Positive for abdominal pain. Negative for blood in stool.  Genitourinary: Negative for dysuria.  Musculoskeletal: Negative for myalgias.  Skin: Negative for rash.  Neurological: Negative for dizziness and headaches.  Endo/Heme/Allergies: Does not bruise/bleed easily.  Psychiatric/Behavioral: Negative for depression. The patient is not nervous/anxious.      OBJECTIVE Physical Exam: Vitals:   03/21/21 0926  BP: 136/77  Pulse: 82  Weight: 185 lb (83.9 kg)  Height: 5\' 5"  (1.651 m)    Physical Exam Constitutional:      General: She is not in acute distress. Pulmonary:     Effort: Pulmonary effort is normal.  Abdominal:     General: There is no distension.     Palpations: Abdomen is soft.     Tenderness: There is no abdominal tenderness. There is no rebound.  Musculoskeletal:        General: No swelling. Normal range of motion.  Skin:    General: Skin is warm and dry.     Findings: No rash.  Neurological:     Mental Status: She is alert and oriented to person, place, and time.  Psychiatric:        Mood and Affect: Mood normal.         Behavior: Behavior normal.    GU / Detailed Urogynecologic Evaluation:  Pelvic Exam: Normal external female genitalia; Bartholin's and Skene's glands normal in appearance; urethral meatus normal in appearance, no urethral masses or discharge.   CST: negative  Speculum exam reveals normal vaginal mucosa without atrophy. Cervix normal appearance. Uterus normal single, nontender. Adnexa no mass, fullness, tenderness.     Pelvic floor strength II/V  Pelvic floor musculature: Right levator non-tender, Right obturator non-tender, Left levator tender, Left obturator non-tender  POP-Q:   POP-Q  -2                                            Aa   -2  Ba  -8                                              C   4                                            Gh  5                                            Pb  9                                            tvl   -2                                            Ap  -2                                            Bp  -8                                              D     Rectal Exam:  Normal external rectum  Post-Void Residual (PVR) by Bladder Scan: In order to evaluate bladder emptying, we discussed obtaining a postvoid residual and she agreed to this procedure.  Procedure: The ultrasound unit was placed on the patient's abdomen in the suprapubic region after the patient had voided. A PVR of 1 ml was obtained by bladder scan.  Laboratory Results: POC urine: moderate blood  I visualized the urine specimen, noting the specimen to be clear yellow  ASSESSMENT AND PLAN Sheri Murphy is a 56 y.o. with:  1. Overactive bladder   2. SUI (stress urinary incontinence, female)   3. Urinary frequency   4. Hematuria, unspecified type    1. OAB We discussed the symptoms of overactive bladder (OAB), which include urinary urgency, urinary frequency, nocturia, with or without urge incontinence.  While we do not  know the exact etiology of OAB, several treatment options exist. We discussed management including behavioral therapy (decreasing bladder irritants, urge suppression strategies, timed voids, bladder retraining), physical therapy, medication; for refractory cases posterior tibial nerve stimulation, sacral neuromodulation, and intravesical botulinum toxin injection.  For anticholinergic medications, we discussed the potential side effects of anticholinergics including dry eyes, dry mouth, constipation, cognitive impairment and urinary retention. - She will work on reducing bladder irritants (coffee, tea) and kegel exercises at home.  - Will also start Vesicare 5mg  daily.  2. SUI For treatment of stress urinary incontinence,  non-surgical options include expectant management, weight loss, physical therapy, as well as a pessary.  Surgical options include a midurethral sling, Burch  urethropexy, and transurethral injection of a bulking agent. - This is not as bothersome at the urge incontinence, so she prefers conservative therapies a this time. Will work on [employer] exercises at home.   3. Hematuria - Has a history of hematuria and renal cell carcinoma. Last POC urine at [facility] in Sep 2021 did not show any blood - Will send for Micro UA and urine culture. If positive for hematuria will send to [facility] for further workup as she would like to establish with a urologist in [place].  - Due for yearly MRI in September  Return 6 weeks for follow up  Jaquita Folds, MD   Time spent: I spent 50 minutes dedicated to the care of this patient on the date of this encounter to include pre-visit review of records, face-to-face time with the patient and post visit documentation and ordering medication/ testing.

## 2021-03-21 ENCOUNTER — Ambulatory Visit (INDEPENDENT_AMBULATORY_CARE_PROVIDER_SITE_OTHER): Payer: BC Managed Care – PPO | Admitting: Obstetrics and Gynecology

## 2021-03-21 ENCOUNTER — Other Ambulatory Visit: Payer: Self-pay

## 2021-03-21 ENCOUNTER — Encounter: Payer: Self-pay | Admitting: Obstetrics and Gynecology

## 2021-03-21 VITALS — BP 136/77 | HR 82 | Ht 65.0 in | Wt 185.0 lb

## 2021-03-21 DIAGNOSIS — R350 Frequency of micturition: Secondary | ICD-10-CM | POA: Diagnosis not present

## 2021-03-21 DIAGNOSIS — N3281 Overactive bladder: Secondary | ICD-10-CM

## 2021-03-21 DIAGNOSIS — N393 Stress incontinence (female) (male): Secondary | ICD-10-CM

## 2021-03-21 DIAGNOSIS — R319 Hematuria, unspecified: Secondary | ICD-10-CM

## 2021-03-21 LAB — POCT URINALYSIS DIPSTICK
Appearance: NORMAL
Bilirubin, UA: NEGATIVE
Glucose, UA: NEGATIVE
Ketones, UA: NEGATIVE
Leukocytes, UA: NEGATIVE
Nitrite, UA: NEGATIVE
Protein, UA: NEGATIVE
Spec Grav, UA: 1.025 (ref 1.010–1.025)
Urobilinogen, UA: 0.2 E.U./dL
pH, UA: 5 (ref 5.0–8.0)

## 2021-03-21 MED ORDER — SOLIFENACIN SUCCINATE 5 MG PO TABS: 5.0000 mg | ORAL_TABLET | Freq: Every day | ORAL | 5 refills | Status: DC

## 2021-03-21 NOTE — Patient Instructions (Addendum)
Today we talked about ways to manage bladder urgency such as altering your diet to avoid irritative beverages and foods (bladder diet) as well as attempting to decrease stress and other exacerbating factors.   The Most Bothersome Foods* The Least Bothersome Foods*  Coffee - Regular & Decaf Tea - caffeinated Carbonated beverages - cola, non-colas, diet & caffeine-free Alcohols - Beer, Red Wine, White Wine, Champagne Fruits - Grapefruit, [place], Orange, [employer] - Cranberry, Grapefruit, Orange, Pineapple Vegetables - Tomato & Tomato Products Flavor Enhancers - Hot peppers, Spicy foods, Chili, Horseradish, Vinegar, Monosodium glutamate (MSG) Artificial Sweeteners - NutraSweet, Sweet 'N Low, Equal (sweetener), Saccharin Ethnic foods - [place], [place], [place] food Express Scripts - low-fat & whole Fruits - Bananas, Blueberries, Honeydew melon, Pears, Raisins, Watermelon Vegetables - Broccoli, Brussels Sprouts, [place], Carrots, Cauliflower, [place], Cucumber, Mushrooms, Peas, Radishes, Squash, Zucchini, White potatoes, Sweet potatoes & yams Poultry - Chicken, Eggs, [place], [employer] - Beef, Programmer, multimedia, Lamb Seafood - Shrimp, [place] fish, Salmon Grains - Oat, Rice Snacks - Pretzels, Popcorn  *Lissa Morales et al. Diet and its role in interstitial cystitis/bladder pain syndrome (IC/BPS) and comorbid conditions. [place] 2012 Jan 11.    We discussed the symptoms of overactive bladder (OAB), which include urinary urgency, urinary frequency, night-time urination, with or without urge incontinence.  We discussed management including behavioral therapy (decreasing bladder irritants by following a bladder diet, urge suppression strategies, timed voids, bladder retraining), physical therapy, medication; and for refractory cases posterior tibial nerve stimulation, sacral neuromodulation, and intravesical botulinum toxin injection.   For anticholinergic medications, we discussed the potential  side effects of anticholinergics including dry eyes, dry mouth, constipation, rare risks of cognitive impairment and urinary retention. You were given Vesicare 5mg .  It can take a month to start working so give it time, but if you have bothersome side effects call sooner and we can try a different medication.  Call us if you have trouble filling the prescription or if it's not covered by your insurance.  For treatment of stress urinary incontinence, which is leakage with physical activity/movement/strainging/coughing, we discussed expectant management versus nonsurgical options versus surgery. Nonsurgical options include weight loss, physical therapy, as well as a pessary.  Surgical options include a midurethral sling, which is a synthetic mesh sling that acts like a hammock under the urethra to prevent leakage of urine,  and transurethral injection of a bulking agent.

## 2021-03-22 LAB — URINALYSIS, MICROSCOPIC ONLY
Bacteria, UA: NONE SEEN
Casts: NONE SEEN /lpf
WBC, UA: NONE SEEN /hpf (ref 0–5)

## 2021-03-23 ENCOUNTER — Encounter: Payer: Self-pay | Admitting: Obstetrics and Gynecology

## 2021-03-23 ENCOUNTER — Other Ambulatory Visit: Payer: Self-pay | Admitting: Obstetrics and Gynecology

## 2021-03-23 DIAGNOSIS — R319 Hematuria, unspecified: Secondary | ICD-10-CM

## 2021-03-23 LAB — URINE CULTURE

## 2021-03-23 NOTE — Progress Notes (Signed)
Reviewed micro urinalysis and 11-30 RBC were seen, no other bacteria noted although lab did not run the ordered urine culture. In care everywhere, last UA 08/2020 showed no blood. Due to her history of renal cell carcinoma, will send to [facility] to establish care. Last MR of the kidney on 09/28/20 was normal.

## 2021-04-24 DIAGNOSIS — R3121 Asymptomatic microscopic hematuria: Secondary | ICD-10-CM | POA: Diagnosis not present

## 2021-04-24 DIAGNOSIS — Z85528 Personal history of other malignant neoplasm of kidney: Secondary | ICD-10-CM | POA: Diagnosis not present

## 2021-04-29 DIAGNOSIS — Z8616 Personal history of COVID-19: Secondary | ICD-10-CM

## 2021-04-29 HISTORY — DX: Personal history of COVID-19: Z86.16

## 2021-05-02 ENCOUNTER — Ambulatory Visit: Payer: BC Managed Care – PPO | Admitting: Obstetrics and Gynecology

## 2021-05-02 NOTE — Progress Notes (Deleted)
 Urogynecology Return Visit  SUBJECTIVE  History of Present Illness: Sheri Murphy is a 55 y.o. female seen in follow-up for overactive bladder and hematuria. Plan at last visit was to start vesicare 5mg  daily. She was seen by [facility] for her hematuria and history of RCC.     Past Medical History: Patient  has a past medical history of Abnormal liver function (01/08/2016), Anemia, Anxiety, Controlled type 2 diabetes mellitus without complication ([place]), Fatty liver, Fibroids, Fibromyalgia, GERD (gastroesophageal reflux disease), Heart murmur, Hematuria, Hypertension, IBS (irritable bowel syndrome), Insomnia, Menorrhagia, Obesity, and Renal cell carcinoma, left ([place]).   Past Surgical History: She  has a past surgical history that includes Breast biopsy (Right); Left renal mass removal (07/2017); Skin lesion excision; Wisdom tooth extraction; Colonoscopy; and Dilatation & curettage/hysteroscopy with myosure (N/A, 05/13/2018).   Medications: She has a current medication list which includes the following prescription(s): acetaminophen, bisoprolol-hydrochlorothiazide, cetirizine, cholecalciferol, glipizide, ibuprofen, metformin, norethindrone, omeprazole, and solifenacin.   Allergies: Patient is allergic to amitriptyline, chlordiazepoxide, and macrodantin [nitrofurantoin].   Social History: Patient  reports that she has never smoked. She has never used smokeless tobacco. She reports current alcohol use. She reports that she does not use drugs.      OBJECTIVE     Physical Exam: There were no vitals filed for this visit. Gen: No apparent distress, A&O x 3.  Detailed Urogynecologic Evaluation:  Deferred. Prior exam showed:  No flowsheet data found.     ASSESSMENT AND PLAN    [name] is a 55 y.o. with:  No diagnosis found.

## 2021-05-03 ENCOUNTER — Encounter: Payer: Self-pay | Admitting: Obstetrics and Gynecology

## 2021-05-03 ENCOUNTER — Ambulatory Visit (INDEPENDENT_AMBULATORY_CARE_PROVIDER_SITE_OTHER): Payer: BC Managed Care – PPO | Admitting: Obstetrics and Gynecology

## 2021-05-03 ENCOUNTER — Other Ambulatory Visit: Payer: Self-pay

## 2021-05-03 VITALS — BP 136/83 | HR 71 | Ht 65.0 in | Wt 185.0 lb

## 2021-05-03 DIAGNOSIS — N393 Stress incontinence (female) (male): Secondary | ICD-10-CM | POA: Diagnosis not present

## 2021-05-03 DIAGNOSIS — N3281 Overactive bladder: Secondary | ICD-10-CM

## 2021-05-03 DIAGNOSIS — M545 Low back pain, unspecified: Secondary | ICD-10-CM | POA: Diagnosis not present

## 2021-05-03 DIAGNOSIS — E119 Type 2 diabetes mellitus without complications: Secondary | ICD-10-CM | POA: Diagnosis not present

## 2021-05-03 DIAGNOSIS — M6281 Muscle weakness (generalized): Secondary | ICD-10-CM | POA: Diagnosis not present

## 2021-05-03 DIAGNOSIS — N3946 Mixed incontinence: Secondary | ICD-10-CM | POA: Diagnosis not present

## 2021-05-03 MED ORDER — SOLIFENACIN SUCCINATE 5 MG PO TABS: 5.0000 mg | ORAL_TABLET | Freq: Every day | ORAL | 5 refills | Status: DC

## 2021-05-03 NOTE — Progress Notes (Signed)
 Urogynecology Return Visit  SUBJECTIVE  History of Present Illness: Sheri Murphy is a 55 y.o. female seen in follow-up for mixed incontinence, urge predominant. Plan at last visit was to start Vesicare 5mg  daily.   Has noticed less leakage on the way to the bathroom- has not had a single incident. Has increased her water intake, voiding about 6 times per day. Occasionally will wake up at night. Has noticed some increased constipation.   Has occasional stress incontinence. Will start pelvic floor physical therapy at [facility].   Had also noted hematuria and was referred to [facility] due to her history of renal cell carcinoma and they will be continuing her annual monitoring.    Past Medical History: Patient  has a past medical history of Abnormal liver function (01/08/2016), Anemia, Anxiety, Controlled type 2 diabetes mellitus without complication ([place]), Fatty liver, Fibroids, Fibromyalgia, GERD (gastroesophageal reflux disease), Heart murmur, Hematuria, Hypertension, IBS (irritable bowel syndrome), Insomnia, Menorrhagia, Obesity, and Renal cell carcinoma, left ([place]).   Past Surgical History: She  has a past surgical history that includes Breast biopsy (Right); Left renal mass removal (07/2017); Skin lesion excision; Wisdom tooth extraction; Colonoscopy; and Dilatation & curettage/hysteroscopy with myosure (N/A, 05/13/2018).   Medications: She has a current medication list which includes the following prescription(s): acetaminophen, bisoprolol-hydrochlorothiazide, cetirizine, cholecalciferol, glipizide, ibuprofen, metformin, omeprazole, solifenacin, solifenacin, and norethindrone.   Allergies: Patient is allergic to amitriptyline, chlordiazepoxide, and macrodantin [nitrofurantoin].   Social History: Patient  reports that she has never smoked. She has never used smokeless tobacco. She reports current alcohol use. She reports that she does not use drugs.      OBJECTIVE      Physical Exam: Vitals:   05/03/21 1431  BP: 136/83  Pulse: 71  Weight: 185 lb (83.9 kg)  Height: 5\' 5"  (1.651 m)   Gen: No apparent distress, A&O x 3.  Detailed Urogynecologic Evaluation:  Deferred.    ASSESSMENT AND PLAN    Sheri Murphy is a 55 y.o. with:  1. Overactive bladder   2. SUI (stress urinary incontinence, female)    1. OAB - She will continue with the vesicare 5mg , refills provided.  - She had some concerns about risk of cognitive dysfunction with anticholinergic use. We discussed that the concern is more with long term use of these medications, or in patients who are higher risk for these issues (elderly, etc). She would like to consider weaning off at some point in the future. Will consider this at next appt.   2. SUI - [facility]  Will have her return in 6 months  Jaquita Folds, MD  Time spent: I spent 20 minutes dedicated to the care of this patient on the date of this encounter to include pre-visit review of records, face-to-face time with the patient and post visit documentation and ordering medication/ testing.

## 2021-05-14 DIAGNOSIS — E119 Type 2 diabetes mellitus without complications: Secondary | ICD-10-CM | POA: Diagnosis not present

## 2021-05-14 DIAGNOSIS — I10 Essential (primary) hypertension: Secondary | ICD-10-CM | POA: Diagnosis not present

## 2021-07-31 ENCOUNTER — Other Ambulatory Visit: Payer: Self-pay

## 2021-07-31 ENCOUNTER — Ambulatory Visit (INDEPENDENT_AMBULATORY_CARE_PROVIDER_SITE_OTHER): Payer: BC Managed Care – PPO | Admitting: Obstetrics & Gynecology

## 2021-07-31 ENCOUNTER — Other Ambulatory Visit (HOSPITAL_COMMUNITY)
Admission: RE | Admit: 2021-07-31 | Discharge: 2021-07-31 | Disposition: A | Discharge status: Home / Self Care | Payer: BC Managed Care – PPO | Source: Ambulatory Visit | Attending: Obstetrics & Gynecology | Admitting: Obstetrics & Gynecology

## 2021-07-31 ENCOUNTER — Encounter: Payer: Self-pay | Admitting: Obstetrics & Gynecology

## 2021-07-31 VITALS — BP 140/86 | HR 68 | Resp 20 | Ht 65.16 in | Wt 173.8 lb

## 2021-07-31 DIAGNOSIS — Z30013 Encounter for initial prescription of injectable contraceptive: Secondary | ICD-10-CM

## 2021-07-31 DIAGNOSIS — Z9889 Other specified postprocedural states: Secondary | ICD-10-CM | POA: Diagnosis not present

## 2021-07-31 DIAGNOSIS — Z01419 Encounter for gynecological examination (general) (routine) without abnormal findings: Secondary | ICD-10-CM

## 2021-07-31 DIAGNOSIS — D259 Leiomyoma of uterus, unspecified: Secondary | ICD-10-CM

## 2021-07-31 DIAGNOSIS — N921 Excessive and frequent menstruation with irregular cycle: Secondary | ICD-10-CM | POA: Diagnosis not present

## 2021-07-31 MED ORDER — MEDROXYPROGESTERONE ACETATE 150 MG/ML IM SUSP: 150.0000 mg | INTRAMUSCULAR | 4 refills | Status: DC

## 2021-07-31 MED ORDER — MEDROXYPROGESTERONE ACETATE 150 MG/ML IM SUSP
150.0000 mg | Freq: Once | INTRAMUSCULAR | Status: AC
  Administered 2021-07-31: 150 mg via INTRAMUSCULAR

## 2021-07-31 NOTE — Progress Notes (Signed)
Sheri Murphy 05-30-1966 [id number]   History:    55 y.o. G3P0A3 Single   RP:  Established patient presenting for annual gyn exam   HPI: At first, had no menses status post endometrial ablation, then had cramps with irregular bleeding.  Currently on the Progestin pill with occasional metrorrhagia with cramps.  Would like to control bleeding and cramps with a different Progestin treatment. Uterine Fibroids on Pelvic US 10/2020.  Intermittent mild hot flushes.  Uses condoms for contraception, abstinent currently.  No abnormal vaginal discharge.  Urine and bowel movements normal.  Breasts normal.  Body mass index improved to 28.78.  More physically active.  Health labs with family physician.  Mother with colon cancer.  Patient had a screening colonoscopy in 2021, benign Polyp.  Will continue colonoscopy every 5 years.     Past medical history,surgical history, family history and social history were all reviewed and documented in the EPIC chart.  Gynecologic History No LMP recorded (approximate). Patient has had an ablation.  Obstetric History OB History  Gravida Para Term Preterm AB Living  3 0     2 0  SAB IAB Ectopic Multiple Live Births  1            # Outcome Date GA Lbr Len/2nd Weight Sex Delivery Anes PTL Lv  3 Gravida           2 AB           1 SAB              ROS: A ROS was performed and pertinent positives and negatives are included in the history.  GENERAL: No fevers or chills. HEENT: No change in vision, no earache, sore throat or sinus congestion. NECK: No pain or stiffness. CARDIOVASCULAR: No chest pain or pressure. No palpitations. PULMONARY: No shortness of breath, cough or wheeze. GASTROINTESTINAL: No abdominal pain, nausea, vomiting or diarrhea, melena or bright red blood per rectum. GENITOURINARY: No urinary frequency, urgency, hesitancy or dysuria. MUSCULOSKELETAL: No joint or muscle pain, no back pain, no recent trauma. DERMATOLOGIC: No rash, no itching, no lesions.  ENDOCRINE: No polyuria, polydipsia, no heat or cold intolerance. No recent change in weight. HEMATOLOGICAL: No anemia or easy bruising or bleeding. NEUROLOGIC: No headache, seizures, numbness, tingling or weakness. PSYCHIATRIC: No depression, no loss of interest in normal activity or change in sleep pattern.     Exam:   BP 140/86 (BP Location: Right Arm)   Pulse 68   Resp 20   Ht 5' 5.16" (1.655 m)   Wt 173 lb 12.8 oz (78.8 kg)   LMP  (Approximate) Comment: 07/13/21  BMI 28.78 kg/m   Body mass index is 28.78 kg/m.  General appearance : Well developed well nourished female. No acute distress HEENT: Eyes: no retinal hemorrhage or exudates,  Neck supple, trachea midline, no carotid bruits, no thyroidmegaly Lungs: Clear to auscultation, no rhonchi or wheezes, or rib retractions  Heart: Regular rate and rhythm, no murmurs or gallops Breast:Examined in sitting and supine position were symmetrical in appearance, no palpable masses or tenderness,  no skin retraction, no nipple inversion, no nipple discharge, no skin discoloration, no axillary or supraclavicular lymphadenopathy Abdomen: no palpable masses or tenderness, no rebound or guarding Extremities: no edema or skin discoloration or tenderness  Pelvic: Vulva: Normal             Vagina: No gross lesions or discharge  Cervix: No gross lesions or discharge.  Pap reflex done.  Uterus  AV, stable about 9 cm, mildly nodular, non-tender and mobile  Adnexa  Without masses or tenderness  Anus: Normal   Assessment/Plan:  55 y.o. female for annual exam   1. Encounter for routine gynecological examination with Papanicolaou smear of cervix Stable uterus with fibroids.  Pap reflex done.  Breast exam normal.  Bilateral diagnostic mammogram was suspicious in August 2021.  Right breast biopsy showed fibrocystic changes and right lymph node was benign.  We will schedule a bilateral mammogram now.  Colonoscopy 2021 with Benign Polyp.  Health labs with  Fam MD.  BMI improved at 28.78.  Continue with lower calorie/carb diet and fitness. - Cytology - PAP( [facility])  2. Encounter for initial prescription of injectable contraceptive Decision to change from the progestin pill to DepoProvera to control BTB and severe cramps.  Usage, risks/benefits reviewed.  1st injection of DepoProvera given today.  Prescription sent to pharmacy for DepoProvera every 3 months.  3. Uterine leiomyoma, unspecified location Stable Uterine Fibroids  4. Metrorrhagia Will attempt control with DepoProvera.  5. S/P endometrial ablation  Other orders - medroxyPROGESTERone (DEPO-PROVERA) injection 150 mg - medroxyPROGESTERone (DEPO-PROVERA) 150 MG/ML injection; Inject 1 mL (150 mg total) into the muscle every 3 (three) months.   [name] MD, 4:07 PM 07/31/2021

## 2021-08-01 LAB — CYTOLOGY - PAP: Diagnosis: NEGATIVE

## 2021-08-03 ENCOUNTER — Encounter: Payer: Self-pay | Admitting: Obstetrics & Gynecology

## 2021-08-09 ENCOUNTER — Other Ambulatory Visit: Payer: Self-pay

## 2021-08-09 DIAGNOSIS — D219 Benign neoplasm of connective and other soft tissue, unspecified: Secondary | ICD-10-CM

## 2021-08-14 NOTE — Telephone Encounter (Signed)
I called and spoke with patient and relayed that Dr. Marguerita Merles wants her to have pelvic u/s.  Appt desk will be calling to schedule and she agrees.

## 2021-09-20 ENCOUNTER — Ambulatory Visit (INDEPENDENT_AMBULATORY_CARE_PROVIDER_SITE_OTHER): Payer: BC Managed Care – PPO

## 2021-09-20 ENCOUNTER — Ambulatory Visit (INDEPENDENT_AMBULATORY_CARE_PROVIDER_SITE_OTHER): Payer: BC Managed Care – PPO | Admitting: Obstetrics & Gynecology

## 2021-09-20 ENCOUNTER — Other Ambulatory Visit: Payer: Self-pay

## 2021-09-20 ENCOUNTER — Encounter: Payer: Self-pay | Admitting: Obstetrics & Gynecology

## 2021-09-20 VITALS — BP 116/72

## 2021-09-20 DIAGNOSIS — N921 Excessive and frequent menstruation with irregular cycle: Secondary | ICD-10-CM | POA: Diagnosis not present

## 2021-09-20 DIAGNOSIS — D219 Benign neoplasm of connective and other soft tissue, unspecified: Secondary | ICD-10-CM

## 2021-09-20 NOTE — Progress Notes (Signed)
    Sheri Murphy 24-Apr-1966 [id number]        55 y.o.  G3P0020   RP: Menometrorrhagia/Fibroids for Pelvic US  HPI: Received DepoProvera on 07/31/2021. No vaginal bleeding since then.  Working on DM diet/fitness to loose weight.  On 07/31/2021 we noted:  At first, had no menses status post endometrial ablation, then had cramps with irregular bleeding.  Currently on the Progestin pill with occasional metrorrhagia with cramps.  Would like to control bleeding and cramps with a different Progestin treatment. Uterine Fibroids on Pelvic US 10/2020.  Intermittent mild hot flushes.  Uses condoms for contraception, abstinent currently.  No abnormal vaginal discharge.  Urine and bowel movements normal.    OB History  Gravida Para Term Preterm AB Living  3 0     2 0  SAB IAB Ectopic Multiple Live Births  1            # Outcome Date GA Lbr Len/2nd Weight Sex Delivery Anes PTL Lv  3 Gravida           2 AB           1 SAB             Past medical history,surgical history, problem list, medications, allergies, family history and social history were all reviewed and documented in the EPIC chart.   Directed ROS with pertinent positives and negatives documented in the history of present illness/assessment and plan.  Exam:  Vitals:   09/20/21 0930  BP: 116/72   General appearance:  Normal  Pelvic US today: T/V images.  Anteverted uterus enlarged with irregular shape.  The uterus is measured at 10.21 x 6.31 x 8.8 cm.  Multiple small fibroids subserous and intramural, the largest is measured at 2.9 x 3.3 cm anteriorly and subserous.  Symmetrical endometrial lining partially visualized.  The cavity is distorted by multiple adjacent fibroids.  The endometrial lining is measured at 5.06 mm status post endometrial ablation.  No obvious mass seen.  Left ovary with an avascular dominant follicle measured at 1.2 cm.  Right ovary with sparse follicles seen.  Bilateral good ovarian perfusion.  No adnexal mass seen.   No free fluid in the posterior cul-de-sac.   Assessment/Plan:  55 y.o. G3P0020   1. Menometrorrhagia Menometrorrhagia in perimenopause.  No vaginal bleeding since Depo-Provera injection August 04, 2021.  Pelvic ultrasound findings thoroughly reviewed with patient.  Patient reassured that fibroids were stable compared to last ultrasound and that the endometrial lining was normal at 5.06 mm status post endometrial ablation.  Ovaries normal, showing follicles.  We will continue on Depo-Provera at this time.  2. Fibroids Stable per pelvic ultrasound today.  Patient reassured.  Other orders - Continuous Blood Gluc Sensor (DEXCOM G6 SENSOR) MISC; SMARTSIG:1 Each Topical Every 10 Days - Continuous Blood Gluc Transmit (DEXCOM G6 TRANSMITTER) MISC; USE AS DIRECTED FOR 90 DAYS, THEN DISCARD AND REPLACE   [name] MD, 9:39 AM 09/20/2021

## 2021-10-19 ENCOUNTER — Emergency Department (HOSPITAL_BASED_OUTPATIENT_CLINIC_OR_DEPARTMENT_OTHER): Payer: BC Managed Care – PPO

## 2021-10-19 ENCOUNTER — Emergency Department (HOSPITAL_BASED_OUTPATIENT_CLINIC_OR_DEPARTMENT_OTHER)
Admission: EM | Admit: 2021-10-19 | Discharge: 2021-10-19 | Disposition: A | Discharge status: Home / Self Care | Payer: BC Managed Care – PPO | Attending: Emergency Medicine | Admitting: Emergency Medicine

## 2021-10-19 ENCOUNTER — Encounter (HOSPITAL_BASED_OUTPATIENT_CLINIC_OR_DEPARTMENT_OTHER): Payer: Self-pay | Admitting: Emergency Medicine

## 2021-10-19 ENCOUNTER — Other Ambulatory Visit: Payer: Self-pay

## 2021-10-19 ENCOUNTER — Telehealth: Payer: Self-pay | Admitting: *Deleted

## 2021-10-19 DIAGNOSIS — D259 Leiomyoma of uterus, unspecified: Secondary | ICD-10-CM | POA: Diagnosis not present

## 2021-10-19 DIAGNOSIS — R102 Pelvic and perineal pain: Secondary | ICD-10-CM | POA: Diagnosis present

## 2021-10-19 DIAGNOSIS — D219 Benign neoplasm of connective and other soft tissue, unspecified: Secondary | ICD-10-CM

## 2021-10-19 DIAGNOSIS — E119 Type 2 diabetes mellitus without complications: Secondary | ICD-10-CM | POA: Insufficient documentation

## 2021-10-19 DIAGNOSIS — Z7984 Long term (current) use of oral hypoglycemic drugs: Secondary | ICD-10-CM | POA: Insufficient documentation

## 2021-10-19 DIAGNOSIS — I10 Essential (primary) hypertension: Secondary | ICD-10-CM | POA: Insufficient documentation

## 2021-10-19 DIAGNOSIS — R3129 Other microscopic hematuria: Secondary | ICD-10-CM | POA: Diagnosis not present

## 2021-10-19 LAB — URINALYSIS, ROUTINE W REFLEX MICROSCOPIC
Bilirubin Urine: NEGATIVE
Glucose, UA: NEGATIVE mg/dL
Ketones, ur: NEGATIVE mg/dL
Leukocytes,Ua: NEGATIVE
Nitrite: NEGATIVE
Protein, ur: NEGATIVE mg/dL
Specific Gravity, Urine: 1.015 (ref 1.005–1.030)
pH: 7 (ref 5.0–8.0)

## 2021-10-19 LAB — BASIC METABOLIC PANEL
Anion gap: 10 (ref 5–15)
BUN: 12 mg/dL (ref 6–20)
CO2: 22 mmol/L (ref 22–32)
Calcium: 8.8 mg/dL — ABNORMAL LOW (ref 8.9–10.3)
Chloride: 103 mmol/L (ref 98–111)
Creatinine, Ser: 0.8 mg/dL (ref 0.44–1.00)
GFR, Estimated: >60 mL/min (ref >60)
Glucose, Bld: 132 mg/dL — ABNORMAL HIGH (ref 70–99)
Potassium: 3.3 mmol/L — ABNORMAL LOW (ref 3.5–5.1)
Sodium: 135 mmol/L (ref 135–145)

## 2021-10-19 LAB — URINALYSIS, MICROSCOPIC (REFLEX)

## 2021-10-19 LAB — CBC WITH DIFFERENTIAL/PLATELET
Abs Immature Granulocytes: 0.03 10*3/uL (ref 0.00–0.07)
Basophils Absolute: 0.1 10*3/uL (ref 0.0–0.1)
Basophils Relative: 1 %
Eosinophils Absolute: 0.5 10*3/uL (ref 0.0–0.5)
Eosinophils Relative: 5 %
HCT: 37.9 % (ref 36.0–46.0)
Hemoglobin: 12.6 g/dL (ref 12.0–15.0)
Immature Granulocytes: 0 %
Lymphocytes Relative: 29 %
Lymphs Abs: 2.8 10*3/uL (ref 0.7–4.0)
MCH: 26 pg (ref 26.0–34.0)
MCHC: 33.2 g/dL (ref 30.0–36.0)
MCV: 78.1 fL — ABNORMAL LOW (ref 80.0–100.0)
Monocytes Absolute: 0.6 10*3/uL (ref 0.1–1.0)
Monocytes Relative: 6 %
Neutro Abs: 5.8 10*3/uL (ref 1.7–7.7)
Neutrophils Relative %: 59 %
Platelets: 356 10*3/uL (ref 150–400)
RBC: 4.85 MIL/uL (ref 3.87–5.11)
RDW: 14.5 % (ref 11.5–15.5)
WBC: 9.9 10*3/uL (ref 4.0–10.5)
nRBC: 0 % (ref 0.0–0.2)

## 2021-10-19 MED ORDER — LIDOCAINE 5 % EX PTCH: 1.0000 | MEDICATED_PATCH | CUTANEOUS | 0 refills | Status: DC

## 2021-10-19 MED ORDER — ACETAMINOPHEN 500 MG PO TABS
1000.0000 mg | ORAL_TABLET | Freq: Once | ORAL | Status: AC
  Administered 2021-10-19: 1000 mg via ORAL
  Filled 2021-10-19: qty 2

## 2021-10-19 MED ORDER — DICYCLOMINE HCL 10 MG PO CAPS
10.0000 mg | ORAL_CAPSULE | Freq: Once | ORAL | Status: AC
  Administered 2021-10-19: 10 mg via ORAL
  Filled 2021-10-19: qty 1

## 2021-10-19 MED ORDER — KETOROLAC TROMETHAMINE 30 MG/ML IJ SOLN
30.0000 mg | Freq: Once | INTRAMUSCULAR | Status: AC
  Administered 2021-10-19: 30 mg via INTRAVENOUS
  Filled 2021-10-19: qty 1

## 2021-10-19 MED ORDER — DICLOFENAC SODIUM ER 100 MG PO TB24: 100.0000 mg | ORAL_TABLET | Freq: Every day | ORAL | 0 refills | Status: DC

## 2021-10-19 MED ORDER — LIDOCAINE 5 % EX PTCH
3.0000 | MEDICATED_PATCH | CUTANEOUS | Status: DC
  Administered 2021-10-19: 2 via TRANSDERMAL
  Filled 2021-10-19: qty 3

## 2021-10-19 MED ORDER — ONDANSETRON HCL 4 MG/2ML IJ SOLN
4.0000 mg | Freq: Once | INTRAMUSCULAR | Status: AC
  Administered 2021-10-19: 4 mg via INTRAVENOUS
  Filled 2021-10-19: qty 2

## 2021-10-19 NOTE — ED Triage Notes (Signed)
Pt c/o lower abd pain and left side pain with nausea x 3 nights. Denies any urinary symptoms.

## 2021-10-19 NOTE — Telephone Encounter (Signed)
Patient went to ER today due pelvic pain, no bleeding,she asked just wanted you to know. She has CT abdomen and pelvis done in epic for you to review as well. Please advise

## 2021-10-19 NOTE — ED Provider Notes (Signed)
[facility] Provider Note   CSN: [id number] Arrival date & time: 10/19/21  0040     History Chief Complaint  Patient presents with   Abdominal Pain    Elah Herrada is a 55 y.o. female.  The history is provided by the patient.  Abdominal Pain Pain location: low abdomen above symphysis pubis. Pain quality: cramping and throbbing   Pain radiates to:  Does not radiate Pain severity:  Severe Onset quality:  Gradual Duration:  5 days Timing:  Intermittent (at night) Progression:  Waxing and waning Chronicity:  Recurrent (had had this pain in the past but less severe) Relieved by:  Nothing Ineffective treatments:  None tried Associated symptoms: no dysuria, no fever, no vaginal bleeding and no vomiting   Risk factors: not pregnant   Patient is a pleasant 55 year old female with a past medical history significant for uterine fibroids on Depo Provera, microscopic hematuria and left renal surgery presents with lower abdominal pain.  She has tried OTC Nsaids without relief. She denies burning with urination.  No vomiting.  No rashes on the skin.  This evening she had some nausea with her symptoms but no emesis.      Past Medical History:  Diagnosis Date   Abnormal liver function 01/08/2016   Anemia    Anxiety    Controlled type 2 diabetes mellitus without complication (HCC)    Fatty liver    Fibroids    Fibromyalgia    GERD (gastroesophageal reflux disease)    Heart murmur    Hematuria    Hypertension    IBS (irritable bowel syndrome)    Insomnia    Menorrhagia    Obesity    Renal cell carcinoma, left ([place])    surgery only chemo/radiation not required    Patient Active Problem List   Diagnosis Date Noted   Fibroid uterus 03/30/2018   Hypertension 03/30/2018   Diabetes mellitus ([place]) 03/30/2018    Past Surgical History:  Procedure Laterality Date   BREAST BIOPSY Right    COLONOSCOPY     DILATATION & CURETTAGE/HYSTEROSCOPY WITH  MYOSURE N/A 05/13/2018   Procedure: DILATATION & CURETTAGE/HYSTEROSCOPY WITH NOVASURE;  Surgeon: [name], MD;  Location: [facility];  Service: Gynecology;  Laterality: N/A;   Left renal mass removal  07/2017   Thermal Microwave Ablation   SKIN LESION EXCISION     WISDOM TOOTH EXTRACTION       OB History     Gravida  3   Para  0   Term      Preterm      AB  2   Living  0      SAB  1   IAB      Ectopic      Multiple      Live Births              Family History  Problem Relation Age of Onset   Cancer Mother 63       COLON   Hypertension Mother    Hypertension Father    Hypertension Maternal Aunt    Cancer Maternal Grandmother        small intestine    Hypertension Maternal Grandmother    Cancer Maternal Grandfather        pancreatic   Diabetes Paternal Grandmother     Social History   Tobacco Use   Smoking status: Never   Smokeless tobacco: Never  Vaping Use  Vaping Use: Never used  Substance Use Topics   Alcohol use: Yes    Comment: occational   Drug use: No    Home Medications Prior to Admission medications   Medication Sig Start Date End Date Taking? Authorizing Provider  Diclofenac Sodium CR 100 MG 24 hr tablet Take 1 tablet (100 mg total) by mouth daily. 10/19/21  Yes Nathasha Fiorillo, MD  acetaminophen (TYLENOL) 500 MG tablet Take 500 mg by mouth every 6 (six) hours as needed for mild pain.    [provider]  bisoprolol-hydrochlorothiazide (ZIAC) 10-6.25 MG tablet Take 1 tablet by mouth daily.    [provider]  cetirizine (ZYRTEC) 5 MG tablet Take 5 mg by mouth daily.    [provider]  cholecalciferol (VITAMIN D) 1000 units tablet Take 1,000 Units by mouth daily.    [provider]  Continuous Blood Gluc Sensor (DEXCOM G6 SENSOR) MISC SMARTSIG:1 Each Topical Every 10 Days 09/17/21   [provider]  Continuous Blood Gluc Transmit (DEXCOM G6 TRANSMITTER) MISC USE AS  DIRECTED FOR 90 DAYS, THEN DISCARD AND REPLACE 07/21/21   [provider]  ibuprofen (ADVIL) 600 MG tablet TAKE 1 TABLET(600 MG) BY MOUTH EVERY 8 HOURS AS NEEDED 12/21/20   [name], MD  medroxyPROGESTERone (DEPO-PROVERA) 150 MG/ML injection Inject 1 mL (150 mg total) into the muscle every 3 (three) months. 07/31/21   [name], MD  metFORMIN (GLUCOPHAGE) 500 MG tablet Take by mouth 2 (two) times daily with a meal.    [provider]  omeprazole (PRILOSEC) 40 MG capsule Take 40 mg by mouth daily.    [provider]    Allergies    Amitriptyline, Chlordiazepoxide, and Macrodantin [nitrofurantoin]  Review of Systems   Review of Systems  Constitutional:  Negative for fever.  HENT:  Negative for facial swelling.   Eyes:  Negative for redness.  Respiratory:  Negative for wheezing and stridor.   Cardiovascular:  Negative for leg swelling.  Gastrointestinal:  Positive for abdominal pain. Negative for vomiting.  Genitourinary:  Negative for dysuria and vaginal bleeding.  Musculoskeletal:  Negative for neck stiffness.  Skin:  Negative for rash.  Neurological:  Negative for seizures and facial asymmetry.  Psychiatric/Behavioral:  Negative for agitation.   All other systems reviewed and are negative.  Physical Exam Updated Vital Signs BP (!) 195/100   Pulse 80   Temp 97.9 F (36.6 C) (Oral)   Resp 16   Ht 5\' 5"  (1.651 m)   Wt 79.4 kg   SpO2 99%   BMI 29.12 kg/m   Physical Exam Vitals and nursing note reviewed.  Constitutional:      General: She is not in acute distress.    Appearance: She is well-developed.  HENT:     Head: Normocephalic.     Nose: Nose normal.  Eyes:     Extraocular Movements: Extraocular movements intact.     Conjunctiva/sclera: Conjunctivae normal.  Cardiovascular:     Rate and Rhythm: Normal rate and regular rhythm.     Pulses: Normal pulses.     Heart sounds: Normal heart sounds.  Pulmonary:     Effort:  Pulmonary effort is normal.     Breath sounds: Normal breath sounds.  Abdominal:     General: Abdomen is flat. Bowel sounds are normal.     Palpations: Abdomen is soft.     Tenderness: There is no abdominal tenderness. There is no guarding or rebound.  Musculoskeletal:  General: Normal range of motion.     Cervical back: Normal range of motion and neck supple.  Skin:    General: Skin is warm and dry.     Capillary Refill: Capillary refill takes less than 2 seconds.     Findings: No lesion or rash.  Neurological:     General: No focal deficit present.     Mental Status: She is alert and oriented to person, place, and time.     Deep Tendon Reflexes: Reflexes normal.  Psychiatric:        Mood and Affect: Mood normal.        Behavior: Behavior normal.    ED Results / Procedures / Treatments   Labs (all labs ordered are listed, but only abnormal results are displayed) Results for orders placed or performed during the hospital encounter of 10/19/21  Urinalysis, Routine w reflex microscopic  Result Value Ref Range   Color, Urine YELLOW YELLOW   APPearance HAZY (A) CLEAR   Specific Gravity, Urine 1.015 1.005 - 1.030   pH 7.0 5.0 - 8.0   Glucose, UA NEGATIVE NEGATIVE mg/dL   Hgb urine dipstick MODERATE (A) NEGATIVE   Bilirubin Urine NEGATIVE NEGATIVE   Ketones, ur NEGATIVE NEGATIVE mg/dL   Protein, ur NEGATIVE NEGATIVE mg/dL   Nitrite NEGATIVE NEGATIVE   Leukocytes,Ua NEGATIVE NEGATIVE  CBC with Differential/Platelet  Result Value Ref Range   WBC 9.9 4.0 - 10.5 K/uL   RBC 4.85 3.87 - 5.11 MIL/uL   Hemoglobin 12.6 12.0 - 15.0 g/dL   HCT 37.9 36.0 - 46.0 %   MCV 78.1 (L) 80.0 - 100.0 fL   MCH 26.0 26.0 - 34.0 pg   MCHC 33.2 30.0 - 36.0 g/dL   RDW 14.5 11.5 - 15.5 %   Platelets 356 150 - 400 K/uL   nRBC 0.0 0.0 - 0.2 %   Neutrophils Relative % 59 %   Neutro Abs 5.8 1.7 - 7.7 K/uL   Lymphocytes Relative 29 %   Lymphs Abs 2.8 0.7 - 4.0 K/uL   Monocytes Relative 6 %    Monocytes Absolute 0.6 0.1 - 1.0 K/uL   Eosinophils Relative 5 %   Eosinophils Absolute 0.5 0.0 - 0.5 K/uL   Basophils Relative 1 %   Basophils Absolute 0.1 0.0 - 0.1 K/uL   Immature Granulocytes 0 %   Abs Immature Granulocytes 0.03 0.00 - 0.07 K/uL  Basic metabolic panel  Result Value Ref Range   Sodium 135 135 - 145 mmol/L   Potassium 3.3 (L) 3.5 - 5.1 mmol/L   Chloride 103 98 - 111 mmol/L   CO2 22 22 - 32 mmol/L   Glucose, Bld 132 (H) 70 - 99 mg/dL   BUN 12 6 - 20 mg/dL   Creatinine, Ser 0.80 0.44 - 1.00 mg/dL   Calcium 8.8 (L) 8.9 - 10.3 mg/dL   GFR, Estimated >60 >60 mL/min   Anion gap 10 5 - 15  Urinalysis, Microscopic (reflex)  Result Value Ref Range   RBC / HPF 6-10 0 - 5 RBC/hpf   WBC, UA 0-5 0 - 5 WBC/hpf   Bacteria, UA FEW (A) NONE SEEN   Squamous Epithelial / LPF 0-5 0 - 5   US Transvaginal Non-OB  Result Date: 10/12/2021 T/V images.  Anteverted uterus enlarged with irregular shape.  The uterus is measured at 10.21 x 6.31 x 8.8 cm.  Multiple small fibroids subserous and intramural, the largest is measured at 2.9 x 3.3 cm anteriorly and  subserous.  Symmetrical endometrial lining partially visualized.  The cavity is distorted by multiple adjacent fibroids.  The endometrial lining is measured at 5.06 mm status post endometrial ablation.  No obvious mass seen.  Left ovary with an avascular dominant follicle measured at 1.2 cm.  Right ovary with sparse follicles seen.  Bilateral good ovarian perfusion.  No adnexal mass seen.  No free fluid in the posterior cul-de-sac.    CT Renal Stone Study  Result Date: 10/19/2021 CLINICAL DATA:  Left lower abdominal pain, nausea. History of left renal cell carcinoma. EXAM: CT ABDOMEN AND PELVIS WITHOUT CONTRAST TECHNIQUE: Multidetector CT imaging of the abdomen and pelvis was performed following the standard protocol without IV contrast. COMPARISON:  05/09/2020 FINDINGS: Lower chest: Lung bases are clear. Hepatobiliary: Mild hepatic  steatosis with focal fatty sparing along the gallbladder fossa. Gallbladder is unremarkable. No intrahepatic or extrahepatic duct dilatation. Pancreas: Within normal limits. Spleen: Within normal limits. Adrenals/Urinary Tract: Adrenal glands are within normal limits. 18 mm fat density lesion along the posterior aspect of the left upper kidney (series 2/image 16), likely corresponding to a site of prior ablation. Right kidney is unremarkable. No renal calculi or hydronephrosis. Bladder is within normal limits. Stomach/Bowel: Stomach is within normal limits. No evidence of bowel obstruction. Normal appendix (series 2/image 82). No colonic wall thickening or inflammatory changes. Vascular/Lymphatic: No evidence of abdominal aortic aneurysm. No suspicious abdominopelvic lymphadenopathy. Reproductive: Heterogeneous uterus with uterine fibroids. No adnexal masses. Other: No abdominopelvic ascites. Musculoskeletal: Visualized osseous structures are within normal limits. IMPRESSION: Status post left renal ablation. No evidence of recurrent or metastatic disease. Uterine fibroids. Mild hepatic steatosis. Electronically Signed   By: Julian Hy M.D.   On: 10/19/2021 01:14     Radiology CT Renal Stone Study  Result Date: 10/19/2021 CLINICAL DATA:  Left lower abdominal pain, nausea. History of left renal cell carcinoma. EXAM: CT ABDOMEN AND PELVIS WITHOUT CONTRAST TECHNIQUE: Multidetector CT imaging of the abdomen and pelvis was performed following the standard protocol without IV contrast. COMPARISON:  05/09/2020 FINDINGS: Lower chest: Lung bases are clear. Hepatobiliary: Mild hepatic steatosis with focal fatty sparing along the gallbladder fossa. Gallbladder is unremarkable. No intrahepatic or extrahepatic duct dilatation. Pancreas: Within normal limits. Spleen: Within normal limits. Adrenals/Urinary Tract: Adrenal glands are within normal limits. 18 mm fat density lesion along the posterior aspect of the left  upper kidney (series 2/image 16), likely corresponding to a site of prior ablation. Right kidney is unremarkable. No renal calculi or hydronephrosis. Bladder is within normal limits. Stomach/Bowel: Stomach is within normal limits. No evidence of bowel obstruction. Normal appendix (series 2/image 82). No colonic wall thickening or inflammatory changes. Vascular/Lymphatic: No evidence of abdominal aortic aneurysm. No suspicious abdominopelvic lymphadenopathy. Reproductive: Heterogeneous uterus with uterine fibroids. No adnexal masses. Other: No abdominopelvic ascites. Musculoskeletal: Visualized osseous structures are within normal limits. IMPRESSION: Status post left renal ablation. No evidence of recurrent or metastatic disease. Uterine fibroids. Mild hepatic steatosis. Electronically Signed   By: Julian Hy M.D.   On: 10/19/2021 01:14    Procedures Procedures   Medications Ordered in ED Medications  ketorolac (TORADOL) 30 MG/ML injection 30 mg (30 mg Intravenous Given 10/19/21 0139)  acetaminophen (TYLENOL) tablet 1,000 mg (1,000 mg Oral Given 10/19/21 0139)    ED Course  I have reviewed the triage vital signs and the nursing notes.  Pertinent labs & imaging results that were available during my care of the patient were reviewed by me and considered in my medical  decision making (see chart for details).  Ms. Hett was informed of hematuria seen on urinalysis and need for close follow up with urology regarding this.  She verbalizes understanding of this and will follow up.  She was aware of this diagnosis previously.    I believe her symptoms are due to uterine fibroids.  I will start Voltaren XR.  She may use Tylenol for break through pain.  She will follow up with her gynecologist to discuss ongoing treatment of fibroids.  Patient is stable for discharge with close follow up.    Romanita Fager was evaluated in Emergency Department on 10/19/2021 for the symptoms described in the history of  present illness. She was evaluated in the context of the global COVID-19 pandemic, which necessitated consideration that the patient might be at risk for infection with the SARS-CoV-2 virus that causes COVID-19. Institutional protocols and algorithms that pertain to the evaluation of patients at risk for COVID-19 are in a state of rapid change based on information released by regulatory bodies including the CDC and federal and state organizations. These policies and algorithms were followed during the patient's care in the ED.  Final Clinical Impression(s) / ED Diagnoses Final diagnoses:  Fibroids  Other microscopic hematuria   Return for intractable cough, coughing up blood, fevers > 100.4 unrelieved by medication, shortness of breath, intractable vomiting, chest pain, shortness of breath, weakness, numbness, changes in speech, facial asymmetry, abdominal pain, passing out, Inability to tolerate liquids or food, cough, altered mental status or any concerns. No signs of systemic illness or infection. The patient is nontoxic-appearing on exam and vital signs are within normal limits.  I have reviewed the triage vital signs and the nursing notes. Pertinent labs & imaging results that were available during my care of the patient were reviewed by me and considered in my medical decision making (see chart for details). After history, exam, and medical workup I feel the patient has been appropriately medically screened and is safe for discharge home. Pertinent diagnoses were discussed with the patient. Patient was given return precautions.  Rx / DC Orders ED Discharge Orders          Ordered    Diclofenac Sodium CR 100 MG 24 hr tablet  Daily        10/19/21 0134             Jaramie Bastos, MD 10/19/21 [id number]

## 2021-10-22 ENCOUNTER — Ambulatory Visit: Payer: BC Managed Care – PPO

## 2021-10-22 NOTE — Telephone Encounter (Signed)
Patient informed with below note. Patient said she will call back to schedule.

## 2021-10-26 ENCOUNTER — Other Ambulatory Visit: Payer: Self-pay

## 2021-10-26 ENCOUNTER — Encounter: Payer: Self-pay | Admitting: Obstetrics & Gynecology

## 2021-10-26 ENCOUNTER — Telehealth: Payer: Self-pay

## 2021-10-26 ENCOUNTER — Ambulatory Visit (INDEPENDENT_AMBULATORY_CARE_PROVIDER_SITE_OTHER): Payer: BC Managed Care – PPO | Admitting: Obstetrics & Gynecology

## 2021-10-26 VITALS — BP 122/70 | HR 89 | Resp 16 | Ht 65.0 in | Wt 174.0 lb

## 2021-10-26 DIAGNOSIS — D219 Benign neoplasm of connective and other soft tissue, unspecified: Secondary | ICD-10-CM

## 2021-10-26 DIAGNOSIS — Z9889 Other specified postprocedural states: Secondary | ICD-10-CM | POA: Diagnosis not present

## 2021-10-26 DIAGNOSIS — N8003 Adenomyosis of the uterus: Secondary | ICD-10-CM | POA: Diagnosis not present

## 2021-10-26 DIAGNOSIS — R102 Pelvic and perineal pain: Secondary | ICD-10-CM | POA: Diagnosis not present

## 2021-10-26 NOTE — Telephone Encounter (Signed)
-----   Message from [name], MD sent at 10/26/2021 10:39 AM EDT ----- Regarding: schedule surg Surgery: XI Robotic Total Laparoscopic Hysterectomy with Bilateral Salpingectomy  Diagnosis: Severe recurrent pelvic pain, Fibroids, probable Adenomyosis  Location: [facility]  Status: Outpatient  Time: 107 Minutes  Assistant: First Available Provider  Urgency: First Available  Pre-Op Appointment: To Be Scheduled  Post-Op Appointment(s): 2 Weeks, 6 Weeks  Time Out Of Work: 6 Weeks

## 2021-10-26 NOTE — Progress Notes (Addendum)
    Sheri Murphy 05-08-1966 [id number]        55 y.o.  G3P0020   RP: Pelvic pain with Fibroids  HPI: Went to ER for a recurrence of severe acute pelvic crampy pain.  DepoProvera received in 07/2021.  No vaginal bleeding.  H/O Endometrial Ablation.   OB History  Gravida Para Term Preterm AB Living  3 0     2 0  SAB IAB Ectopic Multiple Live Births  1            # Outcome Date GA Lbr Len/2nd Weight Sex Delivery Anes PTL Lv  3 Gravida           2 AB           1 SAB             Past medical history,surgical history, problem list, medications, allergies, family history and social history were all reviewed and documented in the EPIC chart.   Directed ROS with pertinent positives and negatives documented in the history of present illness/assessment and plan.  Exam:  Vitals:   10/26/21 1000  BP: 122/70  Pulse: 89  Resp: 16  SpO2: 98%  Weight: 174 lb (78.9 kg)  Height: 5\' 5"  (1.651 m)   General appearance:  Normal   Gynecologic exam: Deferred  Pelvic US 09/20/2021:  T/V images.  Anteverted uterus enlarged with irregular shape.  The uterus is measured at 10.21 x 6.31 x 8.8 cm.  Multiple small fibroids subserous and intramural, the largest is measured at 2.9 x 3.3 cm anteriorly and subserous.  Symmetrical endometrial lining partially visualized.  The cavity is distorted by multiple adjacent fibroids.  The endometrial lining is measured at 5.06 mm status post endometrial ablation.  No obvious mass seen.  Left ovary with an avascular dominant follicle measured at 1.2 cm.  Right ovary with sparse follicles seen.  Bilateral good ovarian perfusion.  No adnexal mass seen.  No free fluid in the posterior cul-de-sac.       Assessment/Plan:  55 y.o. G3P0020   1. Pelvic pain in female Persistent recurrent pelvic pain with probable Adenomyosis and Uterine Fibroids.  Counseling done.  Decision to proceed with XI Robotic TLH/Bilateral Salpingectomy.  Info and pamphlet given.  F/U Preop  visit.  2. Adenomyosis of the uterus Probable Adenomyosis with severe uterine cramping and recurrent pain.  3. Fibroids Sxic Fibroids.  4. S/P endometrial ablation  No menses, but persistent recurrent uterine cramping.  [name] MD, 10:20 AM 10/26/2021

## 2021-10-27 IMAGING — CT CT RENAL STONE PROTOCOL
2 of 4 series · 17 of 46 positions shown, 19 images · non-contrast
Comparison: 05/09/2020

CLINICAL DATA: Left lower abdominal pain, nausea. History of left
renal cell carcinoma.

EXAM:
CT ABDOMEN AND PELVIS WITHOUT CONTRAST
TECHNIQUE: Multidetector CT imaging of the abdomen and pelvis was performed
following the standard protocol without IV contrast.

[Series 2: axial st · axial · 0.88mm/px · z∈[+719,+1114]mm · 14 of 87 slices shown, 16 images]
[im 4/87  soft-tissue]
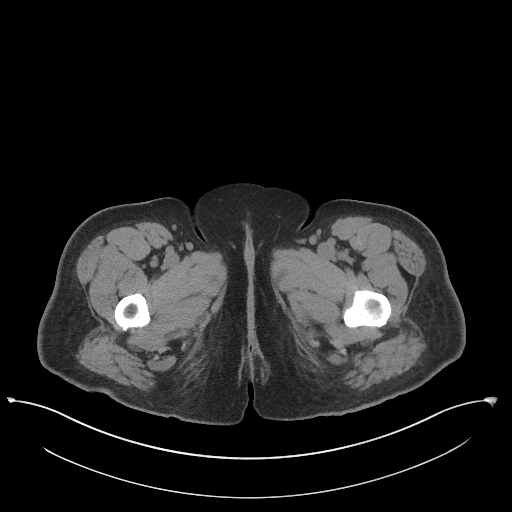
[im 4/87  bone]
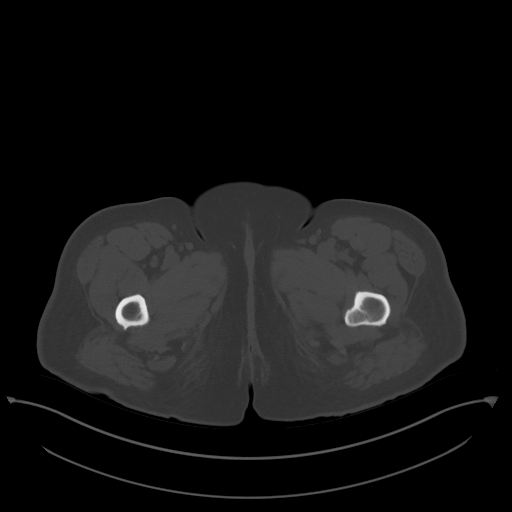
[im 10/87  soft-tissue]
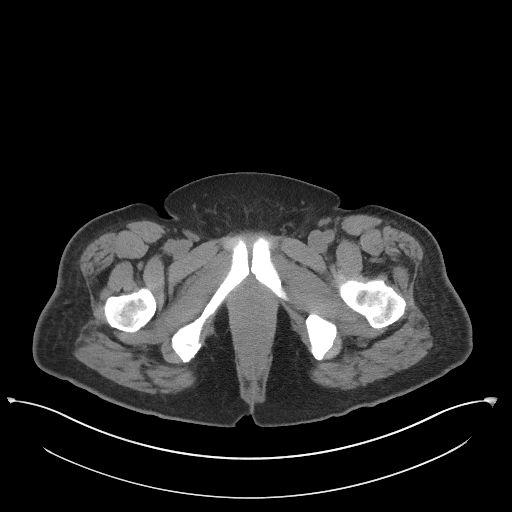
[im 17/87  soft-tissue]
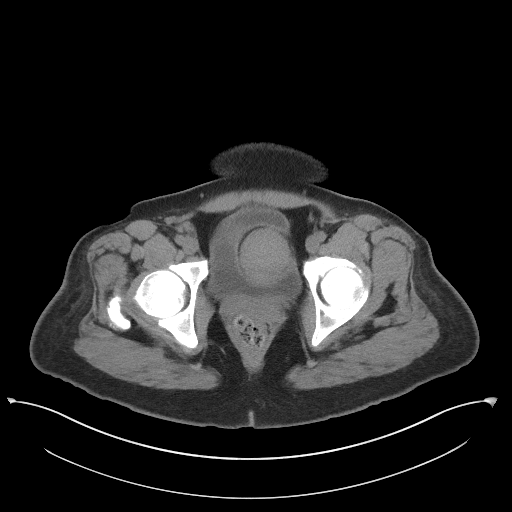
[im 24/87  soft-tissue]
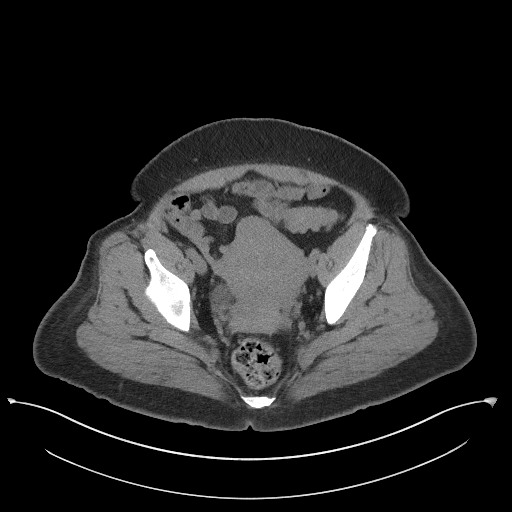
[im 30/87  soft-tissue]
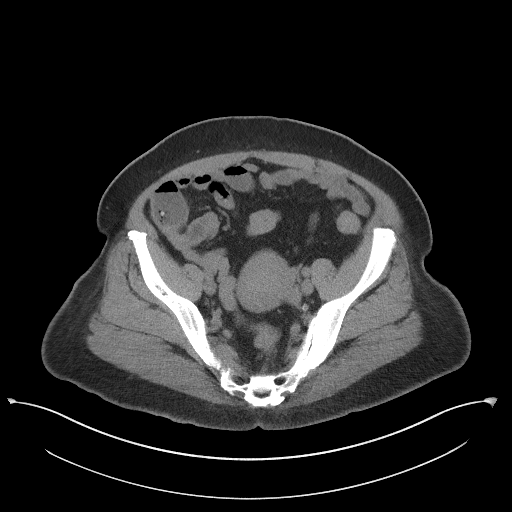
[im 34/87  soft-tissue]
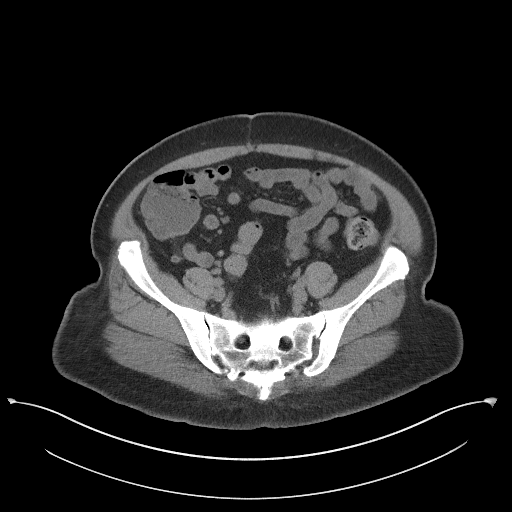
[im 40/87  soft-tissue]
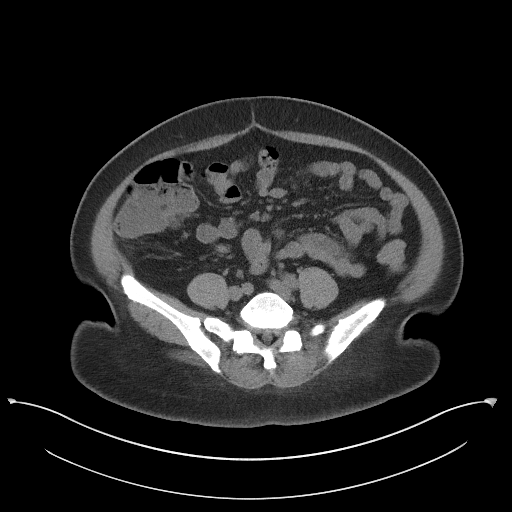
[im 47/87  soft-tissue]
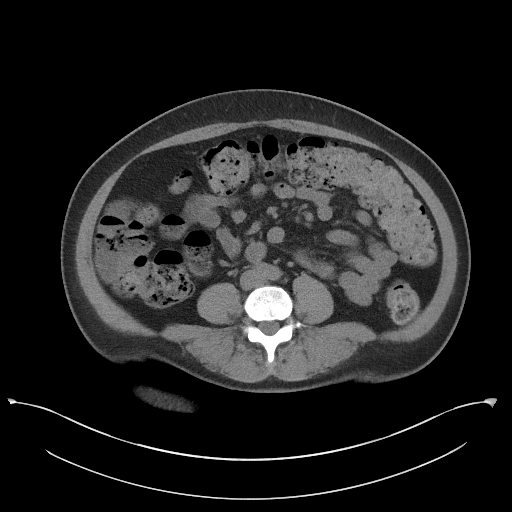
[im 53/87  soft-tissue]
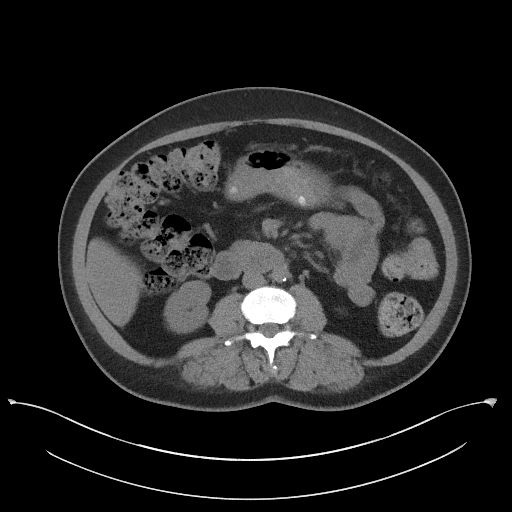
[im 53/87  bone]
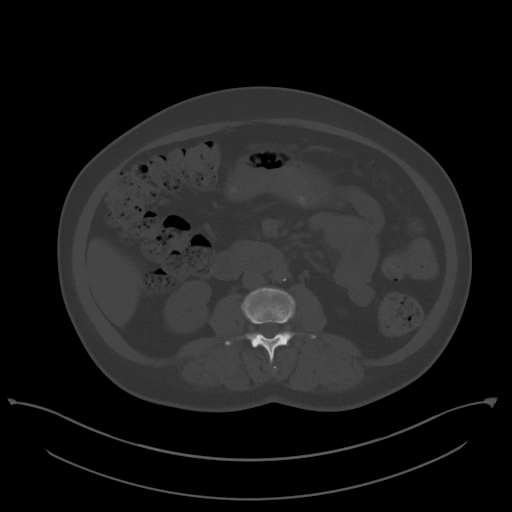
[im 57/87  soft-tissue]
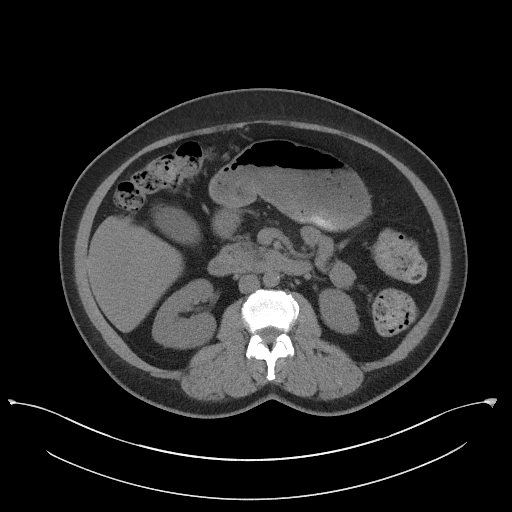
[im 63/87  soft-tissue]
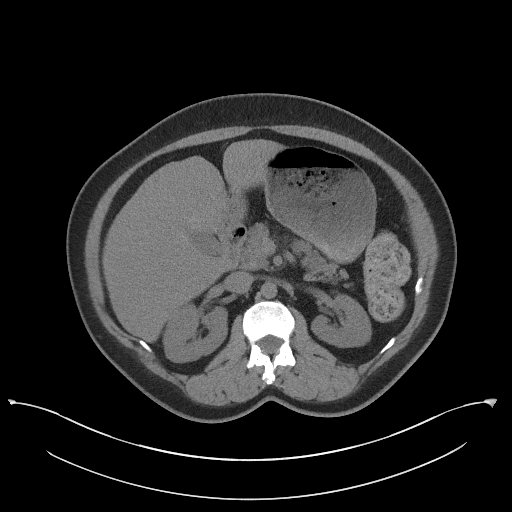
[im 70/87  soft-tissue]
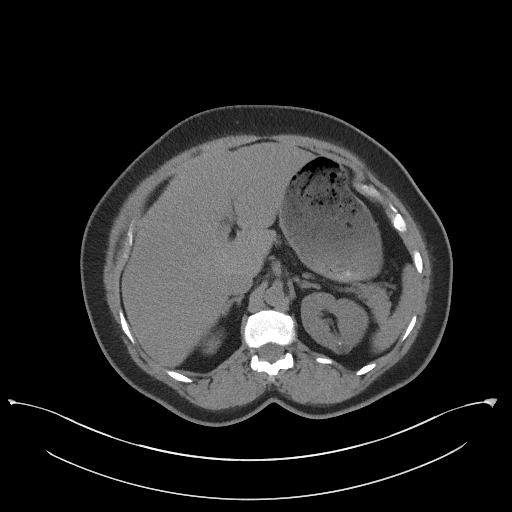
[im 77/87  soft-tissue]
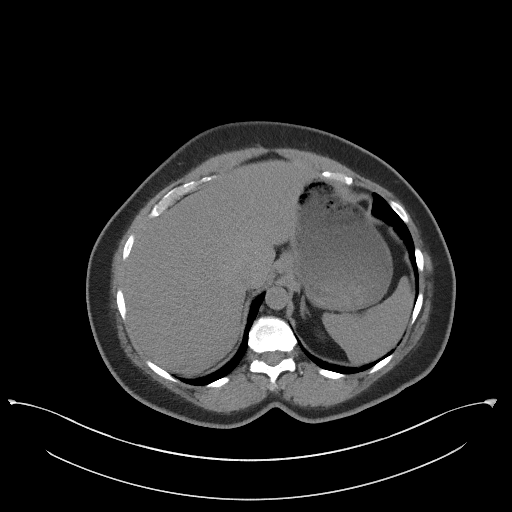
[im 83/87  soft-tissue]
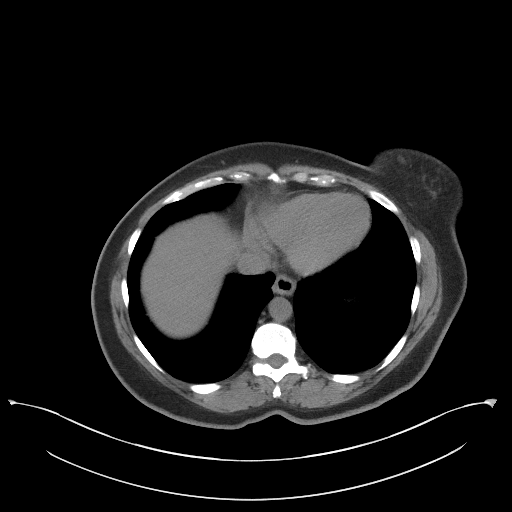

[Series 5: coronal st · coronal · 0.81mm/px · 3 of 101 slices shown]
[im 34/101  soft-tissue]
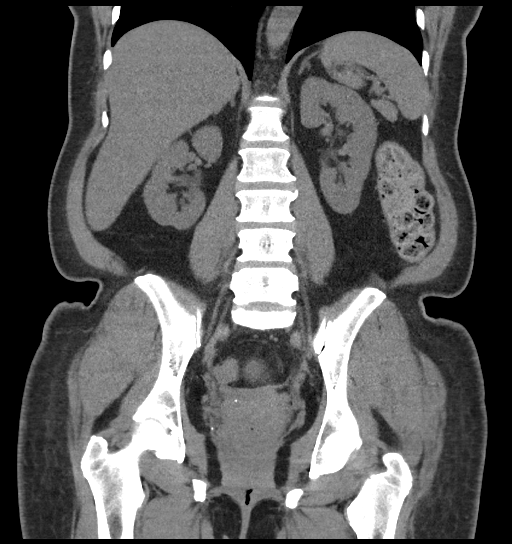
[im 45/101  soft-tissue]
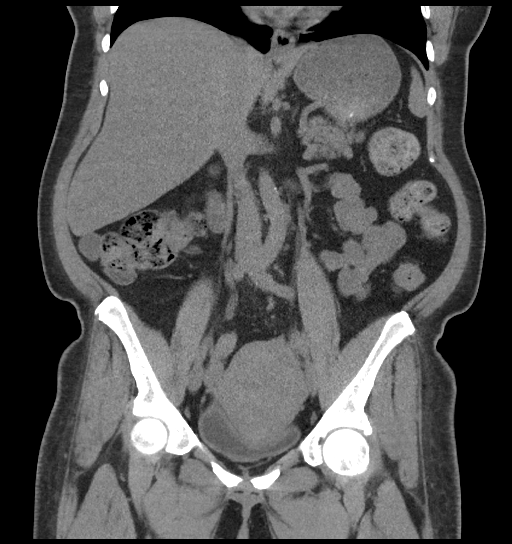
[im 56/101  soft-tissue]
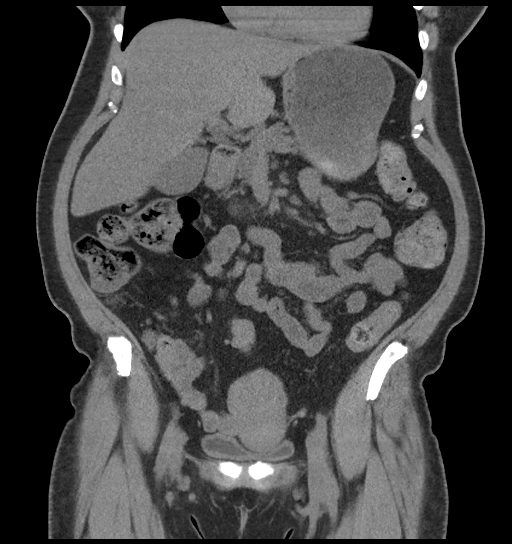

[17 of 46 positions shown; findings below may reference images not displayed]

FINDINGS: Lower chest: Lung bases are clear.

Hepatobiliary: Mild hepatic steatosis with focal fatty sparing along
the gallbladder fossa.

Gallbladder is unremarkable. No intrahepatic or extrahepatic duct
dilatation.

Pancreas: Within normal limits.

Spleen: Within normal limits.

Adrenals/Urinary Tract: Adrenal glands are within normal limits.

18 mm fat density lesion along the posterior aspect of the left
upper kidney (series 2/image 16), likely corresponding to a site of
prior ablation. Right kidney is unremarkable. No renal calculi or
hydronephrosis.

Bladder is within normal limits.

Stomach/Bowel: Stomach is within normal limits.

No evidence of bowel obstruction.

Normal appendix (series 2/image 82).

No colonic wall thickening or inflammatory changes.

Vascular/Lymphatic: No evidence of abdominal aortic aneurysm.

No suspicious abdominopelvic lymphadenopathy.

Reproductive: Heterogeneous uterus with uterine fibroids.

No adnexal masses.

Other: No abdominopelvic ascites.

Musculoskeletal: Visualized osseous structures are within normal
limits.
IMPRESSION: Status post left renal ablation. No evidence of recurrent or
metastatic disease.

Uterine fibroids.

Mild hepatic steatosis.

## 2021-10-31 NOTE — Telephone Encounter (Signed)
Spoke with patient regarding surgery benefits. Patient acknowledges understanding of information presented. Patient is aware that benefits presented are professional benefits only. Patient is aware the hospital will call with facility benefits. See account note.  Routing to Jill Hamm, RN.  

## 2021-11-01 NOTE — Telephone Encounter (Signed)
Spoke with patient. Reviewed surgery dates. Patient request to proceed with surgery on 12/05/21. I will return call once surgery date and time confirmed. Patient verbalizes understanding and is agreeable.   Surgery request sent.

## 2021-11-05 NOTE — Telephone Encounter (Signed)
Spoke with patient. Surgery date request confirmed.  Advised surgery is scheduled for 12/05/21, [facility] at 11am.  Surgery instruction sheet and hospital brochure reviewed, printed copy will be picked up in office at pre-op visit, placed at front office.  Patient advised if Covid screening and quarantine requirements and agreeable.   Routing to provider. Encounter closed.  Cc: Hayley Carder

## 2021-11-06 ENCOUNTER — Other Ambulatory Visit: Payer: Self-pay

## 2021-11-06 ENCOUNTER — Ambulatory Visit (INDEPENDENT_AMBULATORY_CARE_PROVIDER_SITE_OTHER): Payer: BC Managed Care – PPO | Admitting: Obstetrics & Gynecology

## 2021-11-06 ENCOUNTER — Encounter: Payer: Self-pay | Admitting: Obstetrics & Gynecology

## 2021-11-06 VITALS — BP 116/78 | HR 86 | Resp 16 | Ht 64.5 in | Wt 177.0 lb

## 2021-11-06 DIAGNOSIS — D219 Benign neoplasm of connective and other soft tissue, unspecified: Secondary | ICD-10-CM | POA: Diagnosis not present

## 2021-11-06 DIAGNOSIS — Z9889 Other specified postprocedural states: Secondary | ICD-10-CM | POA: Diagnosis not present

## 2021-11-06 DIAGNOSIS — R102 Pelvic and perineal pain: Secondary | ICD-10-CM | POA: Diagnosis not present

## 2021-11-06 DIAGNOSIS — N8003 Adenomyosis of the uterus: Secondary | ICD-10-CM

## 2021-11-06 NOTE — Progress Notes (Signed)
    Sheri Murphy 1966-04-07 [id number]        55 y.o.  G3P0020   RP: Preop XI Robotic TLH/Bilateral Salpingectomy on 12/05/2021  HPI:  Symptomatic Fibroids with recurrent, severe acute pelvic crampy pain.  DepoProvera received in 07/2021.  No vaginal bleeding.  H/O Endometrial Ablation.   OB History  Gravida Para Term Preterm AB Living  3 0     2 0  SAB IAB Ectopic Multiple Live Births  1            # Outcome Date GA Lbr Len/2nd Weight Sex Delivery Anes PTL Lv  3 Gravida           2 AB           1 SAB             Past medical history,surgical history, problem list, medications, allergies, family history and social history were all reviewed and documented in the EPIC chart.   Directed ROS with pertinent positives and negatives documented in the history of present illness/assessment and plan.  Exam:  Vitals:   11/06/21 1057  BP: 116/78  Pulse: 86  Resp: 16  Weight: 177 lb (80.3 kg)  Height: 5' 4.5" (1.638 m)   General appearance:  Normal  Pelvic US 08/2021:  T/V images.  Anteverted uterus enlarged with irregular shape.  The uterus is measured at 10.21 x 6.31 x 8.8 cm.  Multiple small fibroids subserous and intramural, the largest is measured at 2.9 x 3.3 cm anteriorly and subserous.  Symmetrical endometrial lining partially visualized.  The cavity is distorted by multiple adjacent fibroids.  The endometrial lining is measured at 5.06 mm status post endometrial ablation.  No obvious mass seen.  Left ovary with an avascular dominant follicle measured at 1.2 cm.  Right ovary with sparse follicles seen.  Bilateral good ovarian perfusion.  No adnexal mass seen.  No free fluid in the posterior cul-de-sac.      Assessment/Plan:  55 y.o. G3P0020   1. Pelvic pain in female Persistent recurrent pelvic pain with probable Adenomyosis and Uterine Fibroids.  Counseling done.  Decision to proceed with XI Robotic TLH/Bilateral Salpingectomy.  Preop preparation, Surgery and risks, postop  expectations and precautions thoroughly reviewed.    2. Adenomyosis of the uterus Probable Adenomyosis with severe uterine cramping and recurrent pain.  3. Fibroids Sxic Fibroids.  4. S/P endometrial ablation  No menses, but persistent recurrent uterine cramping.                        Patient was counseled as to the risk of surgery to include the following:  1. Infection (prohylactic antibiotics will be administered)  2. DVT/Pulmonary Embolism (prophylactic pneumo compression stockings will be used)  3.Trauma to internal organs requiring additional surgical procedure to repair any injury to internal organs requiring perhaps additional hospitalization days.  4.Hemmorhage requiring transfusion and blood products which carry risks such as anaphylactic reaction, hepatitis and AIDS  Patient had received literature information on the procedure scheduled and all her questions were answered and fully accepts all risk.     [name] MD, 11:43 AM 11/06/2021

## 2021-11-07 ENCOUNTER — Ambulatory Visit: Payer: BC Managed Care – PPO | Admitting: Obstetrics and Gynecology

## 2021-11-28 ENCOUNTER — Encounter (HOSPITAL_BASED_OUTPATIENT_CLINIC_OR_DEPARTMENT_OTHER): Payer: Self-pay | Admitting: Obstetrics & Gynecology

## 2021-11-28 ENCOUNTER — Other Ambulatory Visit: Payer: Self-pay

## 2021-11-28 DIAGNOSIS — K760 Fatty (change of) liver, not elsewhere classified: Secondary | ICD-10-CM | POA: Diagnosis not present

## 2021-11-28 DIAGNOSIS — Z01818 Encounter for other preprocedural examination: Secondary | ICD-10-CM | POA: Diagnosis present

## 2021-11-28 DIAGNOSIS — R102 Pelvic and perineal pain: Secondary | ICD-10-CM

## 2021-11-28 DIAGNOSIS — K5900 Constipation, unspecified: Secondary | ICD-10-CM

## 2021-11-28 DIAGNOSIS — R32 Unspecified urinary incontinence: Secondary | ICD-10-CM

## 2021-11-28 HISTORY — DX: Unspecified urinary incontinence: R32

## 2021-11-28 HISTORY — DX: Constipation, unspecified: K59.00

## 2021-11-28 HISTORY — DX: Pelvic and perineal pain: R10.2

## 2021-11-28 NOTE — Progress Notes (Signed)
PLEASE WEAR A MASK OUT IN PUBLIC AND SOCIAL DISTANCE AND [place] YOUR HANDS FREQUENTLY. PLEASE ASK ALL YOUR CLOSE HOUSEHOLD CONTACT TO WEAR MASK OUT IN PUBLIC AND SOCIAL DISTANCE AND [place] HANDS FREQUENTLY ALSO.      Your procedure is scheduled on   12-05-2021  Report to [facility]. M.   Call this number if you have problems the morning of surgery  :[phone].   OUR ADDRESS IS [address].  WE ARE LOCATED IN [facility].  PLEASE BRING YOUR INSURANCE CARD AND PHOTO ID DAY OF SURGERY.  ONLY ONE PERSON ALLOWED IN FACILITY WAITING AREA.                                     REMEMBER:  DO NOT EAT FOOD, CANDY GUM OR MINTS  AFTER MIDNIGHT . YOU MAY HAVE CLEAR LIQUIDS FROM MIDNIGHT UNTIL 815 AM. NO CLEAR LIQUIDS AFTER  815 AM DAY OF SURGERY.   YOU MAY  BRUSH YOUR TEETH MORNING OF SURGERY AND RINSE YOUR MOUTH OUT, NO CHEWING GUM CANDY OR MINTS.    CLEAR LIQUID DIET   Foods Allowed                                                                     Foods Excluded  Coffee and tea, regular and decaf                             liquids that you cannot  Plain Jell-O any favor except red or purple                                           see through such as: Fruit ices (not with fruit pulp)                                     milk, soups, orange juice  Iced Popsicles                                    All solid food Carbonated beverages, regular and diet                                    Cranberry, grape and apple juices Sports drinks like Gatorade Lightly seasoned clear broth or consume(fat free) Sugar  Sample Menu Breakfast                                Lunch  Supper Cranberry juice                                           Jell-O                                     Grape juice                           Apple juice Coffee or tea                        Jell-O                                       Popsicle                                                Coffee or tea                        Coffee or tea  _____________________________________________________________________     TAKE THESE MEDICATIONS MORNING OF SURGERY WITH A SIP OF WATER:   LIDODERM PATCH IF NEEDED, LEVOCERTRIZINE, OMEPRAZOLE.  REMEMBER: NO  BLOOD PRESSURE PILL MORNING OF SURGERY, YOUR BLOOD PRESSURE PILL WILL BE GIVEN AFTER SURGERY.  YOU MAY WEAR YOUR FREE STYE LIBRE MORNING OF SURGERY.  ONE VISITOR IS ALLOWED IN WAITING ROOM ONLY DAY OF SURGERY.  YOU MAY HAVE ANOTHER PERSON SWITCH OUT WITH THE  1  VISITOR IN THE WAITING ROOM DAY OF SURGERY AND A MASK MUST BE WORN IN THE WAITING ROOM.    2 VISITORS  MAY VISIT IN THE EXTENDED RECOVERY ROOM UNTIL 800 PM ONLY 1 VISITOR AGE 81 AND OVER MAY SPEND THE NIGHT AND MUST BE IN EXTENDED RECOVERY ROOM NO LATER THAN 800 PM .   UP TO 2 CHILDREN AGE 85 TO 15 MAY ALSO VISIT IN EXTENDED RECOVERY ROOM ONLY UNTIL 800 PM AND MUST LEAVE BY 800 PM. ALL PERSONS VISITING IN EXTENDED RECOVERY ROOM MUST WEAR A MASK.                                    DO NOT WEAR JEWERLY, MAKE UP. DO NOT WEAR LOTIONS, POWDERS, PERFUMES OR NAIL POLISH ON YOUR FINGERNAILS. TOENAIL POLISH IS OK TO WEAR. DO NOT SHAVE FOR 48 HOURS PRIOR TO DAY OF SURGERY. MEN MAY SHAVE FACE AND NECK. CONTACTS, GLASSES, OR DENTURES MAY NOT BE WORN TO SURGERY.                                    [facility] IS NOT RESPONSIBLE  FOR ANY BELONGINGS.                                                                    Marland Kitchen  [facility] - Preparing for Surgery Before surgery, you can play an important role.  Because skin is not sterile, your skin needs to be as free of germs as possible.  You can reduce the number of germs on your skin by washing with CHG (chlorahexidine gluconate) soap before surgery.  CHG is an antiseptic cleaner which kills germs and bonds with the skin to continue killing germs even after washing. Please DO NOT use  if you have an allergy to CHG or antibacterial soaps.  If your skin becomes reddened/irritated stop using the CHG and inform your nurse when you arrive at Short Stay. Do not shave (including legs and underarms) for at least 48 hours prior to the first CHG shower.  You may shave your face/neck. Please follow these instructions carefully:  1.  Shower with CHG Soap the night before surgery and the  morning of Surgery.  2.  If you choose to wash your hair, wash your hair first as usual with your  normal  shampoo.  3.  After you shampoo, rinse your hair and body thoroughly to remove the  shampoo.                            4.  Use CHG as you would any other liquid soap.  You can apply chg directly  to the skin and wash                      Gently with a scrungie or clean washcloth.  5.  Apply the CHG Soap to your body ONLY FROM THE NECK DOWN.   Do not use on face/ open                           Wound or open sores. Avoid contact with eyes, ears mouth and genitals (private parts).                       Wash face,  Genitals (private parts) with your normal soap.             6.  Wash thoroughly, paying special attention to the area where your surgery  will be performed.  7.  Thoroughly rinse your body with warm water from the neck down.  8.  DO NOT shower/wash with your normal soap after using and rinsing off  the CHG Soap.                9.  Pat yourself dry with a clean towel.            10.  Wear clean pajamas.            11.  Place clean sheets on your bed the night of your first shower and do not  sleep with pets. Day of Surgery : Do not apply any lotions/deodorants the morning of surgery.  Please wear clean clothes to the hospital/surgery center.  IF YOU HAVE ANY SKIN IRRITATION OR PROBLEMS WITH THE SURGICAL SOAP, PLEASE GET A BAR OF GOLD DIAL SOAP AND SHOWER THE NIGHT BEFORE YOUR SURGERY AND THE MORNING OF YOUR SURGERY. PLEASE LET THE NURSE KNOW MORNING OF YOUR SURGERY IF YOU HAD ANY PROBLEMS WITH  THE SURGICAL SOAP.  FAILURE TO FOLLOW THESE INSTRUCTIONS MAY RESULT IN THE CANCELLATION OF YOUR SURGERY PATIENT SIGNATURE_________________________________  NURSE SIGNATURE__________________________________  ________________________________________________________________________  QUESTIONS CALL [facility] PRE OP NURSE PHONE [phone].

## 2021-11-28 NOTE — Progress Notes (Signed)
Spoke w/ via phone for pre-op interview---pt Lab needs dos----   urine preg            Lab results------lab appt 12-03-2021 900 am for cbc cmp t & s, ekg COVID test -----patient states asymptomatic no test needed Arrive at -------915 am 12-05-2021 NPO after MN NO Solid Food.  Clear liquids from MN until---815 am Med rec completed Medications to take morning of surgery -----lidoderm patch prn, levocertrizine, omeprazole Diabetic medication -----none day of surgery Patient instructed no nail polish to be worn day of surgery Patient instructed to bring photo id and insurance card day of surgery Patient aware to have Driver (ride ) / caregiver    for 24 hours after surgery  ronnie walker family member Patient Special Instructions -----pt given extended stay instructions Pre-Op special Istructions -----none Patient verbalized understanding of instructions that were given at this phone interview. Patient denies shortness of breath, chest pain, fever, cough at this phone interview.   Pt will have free style libre glucose monitor on left arm day of surgery.

## 2021-12-03 ENCOUNTER — Other Ambulatory Visit: Payer: Self-pay

## 2021-12-03 ENCOUNTER — Encounter (HOSPITAL_COMMUNITY)
Admission: RE | Admit: 2021-12-03 | Discharge: 2021-12-03 | Disposition: A | Discharge status: Home / Self Care | Payer: BC Managed Care – PPO | Source: Ambulatory Visit | Attending: Obstetrics & Gynecology | Admitting: Obstetrics & Gynecology

## 2021-12-03 DIAGNOSIS — K760 Fatty (change of) liver, not elsewhere classified: Secondary | ICD-10-CM | POA: Diagnosis not present

## 2021-12-03 DIAGNOSIS — Z01818 Encounter for other preprocedural examination: Secondary | ICD-10-CM | POA: Insufficient documentation

## 2021-12-03 LAB — COMPREHENSIVE METABOLIC PANEL
ALT: 22 U/L (ref 0–44)
AST: 18 U/L (ref 15–41)
Albumin: 4.1 g/dL (ref 3.5–5.0)
Alkaline Phosphatase: 43 U/L (ref 38–126)
Anion gap: 3 — ABNORMAL LOW (ref 5–15)
BUN: 12 mg/dL (ref 6–20)
CO2: 24 mmol/L (ref 22–32)
Calcium: 8.7 mg/dL — ABNORMAL LOW (ref 8.9–10.3)
Chloride: 106 mmol/L (ref 98–111)
Creatinine, Ser: 0.65 mg/dL (ref 0.44–1.00)
GFR, Estimated: >60 mL/min (ref >60)
Glucose, Bld: 115 mg/dL — ABNORMAL HIGH (ref 70–99)
Potassium: 3.5 mmol/L (ref 3.5–5.1)
Sodium: 133 mmol/L — ABNORMAL LOW (ref 135–145)
Total Bilirubin: 0.7 mg/dL (ref 0.3–1.2)
Total Protein: 7 g/dL (ref 6.5–8.1)

## 2021-12-03 NOTE — Progress Notes (Signed)
Pt will need CBC on arrival in pre-op on 12-05-2021.

## 2021-12-04 NOTE — Anesthesia Preprocedure Evaluation (Addendum)
Anesthesia Evaluation  Patient identified by MRN, date of birth, ID band Patient awake    Reviewed: Allergy & Precautions, H&P , NPO status , Patient's Chart, lab work & pertinent test results, reviewed documented beta blocker date and time   Airway Mallampati: II  TM Distance: >3 FB Neck ROM: Full    Dental no notable dental hx. (+) Teeth Intact, Dental Advisory Given   Pulmonary neg pulmonary ROS,    Pulmonary exam normal breath sounds clear to auscultation       Cardiovascular Exercise Tolerance: Good hypertension, Pt. on medications and Pt. on home beta blockers  Rhythm:Regular Rate:Normal     Neuro/Psych Anxiety negative neurological ROS     GI/Hepatic Neg liver ROS, GERD  Medicated,  Endo/Other  diabetes, Type 2, Oral Hypoglycemic Agents  Renal/GU negative Renal ROS  negative genitourinary   Musculoskeletal  (+) Fibromyalgia -  Abdominal   Peds  Hematology  (+) Blood dyscrasia, anemia ,   Anesthesia Other Findings   Reproductive/Obstetrics negative OB ROS                           Anesthesia Physical Anesthesia Plan  ASA: 2  Anesthesia Plan: General   Post-op Pain Management: Tylenol PO (pre-op)   Induction: Intravenous  PONV Risk Score and Plan: 4 or greater and Ondansetron, Dexamethasone and Midazolam  Airway Management Planned: Oral ETT  Additional Equipment:   Intra-op Plan:   Post-operative Plan: Extubation in OR  Informed Consent: I have reviewed the patients History and Physical, chart, labs and discussed the procedure including the risks, benefits and alternatives for the proposed anesthesia with the patient or authorized representative who has indicated his/her understanding and acceptance.     Dental advisory given  Plan Discussed with: CRNA  Anesthesia Plan Comments:        Anesthesia Quick Evaluation

## 2021-12-05 ENCOUNTER — Other Ambulatory Visit: Payer: Self-pay

## 2021-12-05 ENCOUNTER — Encounter (HOSPITAL_BASED_OUTPATIENT_CLINIC_OR_DEPARTMENT_OTHER)
Admission: RE | Disposition: A | Discharge status: Home / Self Care | Payer: Self-pay | Source: Home / Self Care | Attending: Obstetrics & Gynecology

## 2021-12-05 ENCOUNTER — Encounter (HOSPITAL_BASED_OUTPATIENT_CLINIC_OR_DEPARTMENT_OTHER): Payer: Self-pay | Admitting: Obstetrics & Gynecology

## 2021-12-05 ENCOUNTER — Ambulatory Visit (HOSPITAL_BASED_OUTPATIENT_CLINIC_OR_DEPARTMENT_OTHER): Payer: BC Managed Care – PPO | Admitting: Anesthesiology

## 2021-12-05 ENCOUNTER — Ambulatory Visit (HOSPITAL_BASED_OUTPATIENT_CLINIC_OR_DEPARTMENT_OTHER)
Admission: RE | Admit: 2021-12-05 | Discharge: 2021-12-06 | Disposition: A | Discharge status: Home / Self Care | Payer: BC Managed Care – PPO | Attending: Obstetrics & Gynecology | Admitting: Obstetrics & Gynecology

## 2021-12-05 DIAGNOSIS — R102 Pelvic and perineal pain: Secondary | ICD-10-CM | POA: Diagnosis present

## 2021-12-05 DIAGNOSIS — I10 Essential (primary) hypertension: Secondary | ICD-10-CM | POA: Diagnosis not present

## 2021-12-05 DIAGNOSIS — M797 Fibromyalgia: Secondary | ICD-10-CM | POA: Diagnosis not present

## 2021-12-05 DIAGNOSIS — D251 Intramural leiomyoma of uterus: Secondary | ICD-10-CM | POA: Diagnosis not present

## 2021-12-05 DIAGNOSIS — Z01818 Encounter for other preprocedural examination: Secondary | ICD-10-CM

## 2021-12-05 DIAGNOSIS — E119 Type 2 diabetes mellitus without complications: Secondary | ICD-10-CM | POA: Diagnosis not present

## 2021-12-05 DIAGNOSIS — K760 Fatty (change of) liver, not elsewhere classified: Secondary | ICD-10-CM

## 2021-12-05 DIAGNOSIS — Z7984 Long term (current) use of oral hypoglycemic drugs: Secondary | ICD-10-CM | POA: Diagnosis not present

## 2021-12-05 DIAGNOSIS — D219 Benign neoplasm of connective and other soft tissue, unspecified: Secondary | ICD-10-CM

## 2021-12-05 DIAGNOSIS — N72 Inflammatory disease of cervix uteri: Secondary | ICD-10-CM | POA: Insufficient documentation

## 2021-12-05 DIAGNOSIS — K219 Gastro-esophageal reflux disease without esophagitis: Secondary | ICD-10-CM | POA: Diagnosis not present

## 2021-12-05 DIAGNOSIS — N8003 Adenomyosis of the uterus: Secondary | ICD-10-CM | POA: Insufficient documentation

## 2021-12-05 DIAGNOSIS — N80203 Endometriosis of bilateral fallopian tubes, unspecified depth: Secondary | ICD-10-CM | POA: Insufficient documentation

## 2021-12-05 DIAGNOSIS — Z9889 Other specified postprocedural states: Secondary | ICD-10-CM

## 2021-12-05 HISTORY — DX: Other specified postprocedural states: Z98.890

## 2021-12-05 HISTORY — DX: Vitamin D deficiency, unspecified: E55.9

## 2021-12-05 HISTORY — PX: ROBOTIC ASSISTED TOTAL HYSTERECTOMY: SHX6085

## 2021-12-05 HISTORY — DX: Urgency of urination: R39.15

## 2021-12-05 HISTORY — DX: Presence of spectacles and contact lenses: Z97.3

## 2021-12-05 LAB — CBC
HCT: 37.7 % (ref 36.0–46.0)
HCT: 39.3 % (ref 36.0–46.0)
Hemoglobin: 12.5 g/dL (ref 12.0–15.0)
Hemoglobin: 13 g/dL (ref 12.0–15.0)
MCH: 26 pg (ref 26.0–34.0)
MCH: 25.9 pg — ABNORMAL LOW (ref 26.0–34.0)
MCHC: 33.2 g/dL (ref 30.0–36.0)
MCHC: 33.1 g/dL (ref 30.0–36.0)
MCV: 78.4 fL — ABNORMAL LOW (ref 80.0–100.0)
MCV: 78.4 fL — ABNORMAL LOW (ref 80.0–100.0)
Platelets: 345 10*3/uL (ref 150–400)
Platelets: 237 10*3/uL (ref 150–400)
RBC: 4.81 MIL/uL (ref 3.87–5.11)
RBC: 5.01 MIL/uL (ref 3.87–5.11)
RDW: 14.5 % (ref 11.5–15.5)
RDW: 14.6 % (ref 11.5–15.5)
WBC: 8.5 10*3/uL (ref 4.0–10.5)
WBC: 14.3 10*3/uL — ABNORMAL HIGH (ref 4.0–10.5)
nRBC: 0 % (ref 0.0–0.2)
nRBC: 0 % (ref 0.0–0.2)

## 2021-12-05 LAB — GLUCOSE, CAPILLARY
Glucose-Capillary: 106 mg/dL — ABNORMAL HIGH (ref 70–99)
Glucose-Capillary: 129 mg/dL — ABNORMAL HIGH (ref 70–99)
Glucose-Capillary: 143 mg/dL — ABNORMAL HIGH (ref 70–99)

## 2021-12-05 LAB — ABO/RH: ABO/RH(D): O POS

## 2021-12-05 LAB — TYPE AND SCREEN
ABO/RH(D): O POS
Antibody Screen: NEGATIVE

## 2021-12-05 LAB — POCT PREGNANCY, URINE: Preg Test, Ur: NEGATIVE

## 2021-12-05 SURGERY — HYSTERECTOMY, TOTAL, ROBOT-ASSISTED
Anesthesia: General | Site: Abdomen | Laterality: Bilateral

## 2021-12-05 MED ORDER — POVIDONE-IODINE 10 % EX SWAB: 2.0000 "application " | Freq: Once | CUTANEOUS | Status: DC

## 2021-12-05 MED ORDER — OXYCODONE-ACETAMINOPHEN 5-325 MG PO TABS
2.0000 | ORAL_TABLET | ORAL | Status: DC | PRN
  Administered 2021-12-05: 1 via ORAL
  Administered 2021-12-05: 2 via ORAL
  Administered 2021-12-05: 1 via ORAL
  Administered 2021-12-06 (×2): 2 via ORAL
  Administered 2021-12-06: 1 via ORAL

## 2021-12-05 MED ORDER — BUPIVACAINE HCL (PF) 0.25 % IJ SOLN
INTRAMUSCULAR | Status: DC | PRN
  Administered 2021-12-05: 17 mL

## 2021-12-05 MED ORDER — HYDRALAZINE HCL 20 MG/ML IJ SOLN
INTRAMUSCULAR | Status: DC | PRN
  Administered 2021-12-05 (×2): 5 mg via INTRAVENOUS

## 2021-12-05 MED ORDER — SODIUM CHLORIDE 0.9 % IV SOLN: INTRAVENOUS | Status: DC

## 2021-12-05 MED ORDER — ONDANSETRON HCL 4 MG/2ML IJ SOLN
INTRAMUSCULAR | Status: DC | PRN
  Administered 2021-12-05: 4 mg via INTRAVENOUS

## 2021-12-05 MED ORDER — FENTANYL CITRATE (PF) 100 MCG/2ML IJ SOLN
INTRAMUSCULAR | Status: DC | PRN
  Administered 2021-12-05: 50 ug via INTRAVENOUS
  Administered 2021-12-05 (×2): 25 ug via INTRAVENOUS
  Administered 2021-12-05: 50 ug via INTRAVENOUS
  Administered 2021-12-05 (×2): 25 ug via INTRAVENOUS
  Administered 2021-12-05: 50 ug via INTRAVENOUS

## 2021-12-05 MED ORDER — KETOROLAC TROMETHAMINE 30 MG/ML IJ SOLN
INTRAMUSCULAR | Status: DC | PRN
Start: 2021-12-05 — End: 2021-12-05
  Administered 2021-12-05: 30 mg via INTRAVENOUS

## 2021-12-05 MED ORDER — OXYCODONE-ACETAMINOPHEN 5-325 MG PO TABS
ORAL_TABLET | ORAL | Status: AC
  Filled 2021-12-05: qty 2

## 2021-12-05 MED ORDER — HYDROCODONE-ACETAMINOPHEN 5-325 MG PO TABS: 1.0000 | ORAL_TABLET | ORAL | Status: DC | PRN

## 2021-12-05 MED ORDER — PROPOFOL 10 MG/ML IV BOLUS
INTRAVENOUS | Status: AC
  Filled 2021-12-05: qty 20

## 2021-12-05 MED ORDER — METFORMIN HCL 500 MG PO TABS
1000.0000 mg | ORAL_TABLET | Freq: Every day | ORAL | Status: DC
  Filled 2021-12-05: qty 2

## 2021-12-05 MED ORDER — DEXAMETHASONE SODIUM PHOSPHATE 4 MG/ML IJ SOLN
INTRAMUSCULAR | Status: DC | PRN
  Administered 2021-12-05: 4 mg via INTRAVENOUS

## 2021-12-05 MED ORDER — CEFAZOLIN SODIUM-DEXTROSE 2-4 GM/100ML-% IV SOLN
INTRAVENOUS | Status: AC
  Filled 2021-12-05: qty 100

## 2021-12-05 MED ORDER — ACETAMINOPHEN 325 MG PO TABS: 650.0000 mg | ORAL_TABLET | ORAL | Status: DC | PRN

## 2021-12-05 MED ORDER — MIDAZOLAM HCL 2 MG/2ML IJ SOLN
INTRAMUSCULAR | Status: DC | PRN
  Administered 2021-12-05: 2 mg via INTRAVENOUS

## 2021-12-05 MED ORDER — LACTATED RINGERS IV SOLN
INTRAVENOUS | Status: DC
  Administered 2021-12-05: 1 mL via INTRAVENOUS

## 2021-12-05 MED ORDER — ROCURONIUM BROMIDE 100 MG/10ML IV SOLN
INTRAVENOUS | Status: DC | PRN
  Administered 2021-12-05: 70 mg via INTRAVENOUS
  Administered 2021-12-05 (×2): 10 mg via INTRAVENOUS

## 2021-12-05 MED ORDER — ONDANSETRON HCL 4 MG/2ML IJ SOLN
INTRAMUSCULAR | Status: AC
  Filled 2021-12-05: qty 2

## 2021-12-05 MED ORDER — ACETAMINOPHEN 500 MG PO TABS
1000.0000 mg | ORAL_TABLET | Freq: Once | ORAL | Status: AC
  Administered 2021-12-05: 1000 mg via ORAL

## 2021-12-05 MED ORDER — BISOPROLOL-HYDROCHLOROTHIAZIDE 10-6.25 MG PO TABS
1.0000 | ORAL_TABLET | Freq: Every day | ORAL | Status: DC
  Administered 2021-12-05: 1 via ORAL
  Filled 2021-12-05: qty 1

## 2021-12-05 MED ORDER — FENTANYL CITRATE (PF) 250 MCG/5ML IJ SOLN
INTRAMUSCULAR | Status: AC
  Filled 2021-12-05: qty 5

## 2021-12-05 MED ORDER — MIDAZOLAM HCL 2 MG/2ML IJ SOLN
INTRAMUSCULAR | Status: AC
  Filled 2021-12-05: qty 2

## 2021-12-05 MED ORDER — HYDROMORPHONE HCL 1 MG/ML IJ SOLN
INTRAMUSCULAR | Status: AC
  Filled 2021-12-05: qty 1

## 2021-12-05 MED ORDER — ONDANSETRON HCL 4 MG/2ML IJ SOLN
4.0000 mg | Freq: Once | INTRAMUSCULAR | Status: AC
  Administered 2021-12-05: 4 mg via INTRAVENOUS

## 2021-12-05 MED ORDER — METFORMIN HCL 500 MG PO TABS
500.0000 mg | ORAL_TABLET | Freq: Once | ORAL | Status: AC
  Administered 2021-12-05: 500 mg via ORAL
  Filled 2021-12-05: qty 1

## 2021-12-05 MED ORDER — ACETAMINOPHEN 500 MG PO TABS
ORAL_TABLET | ORAL | Status: AC
  Filled 2021-12-05: qty 2

## 2021-12-05 MED ORDER — PROPOFOL 500 MG/50ML IV EMUL
INTRAVENOUS | Status: DC | PRN
  Administered 2021-12-05: 50 ug/kg/min via INTRAVENOUS

## 2021-12-05 MED ORDER — PROPOFOL 10 MG/ML IV BOLUS
INTRAVENOUS | Status: DC | PRN
  Administered 2021-12-05: 50 mg via INTRAVENOUS
  Administered 2021-12-05: 150 mg via INTRAVENOUS

## 2021-12-05 MED ORDER — CEFAZOLIN SODIUM-DEXTROSE 2-4 GM/100ML-% IV SOLN
2.0000 g | INTRAVENOUS | Status: AC
  Administered 2021-12-05: 2 g via INTRAVENOUS

## 2021-12-05 MED ORDER — LACTATED RINGERS IV SOLN: INTRAVENOUS | Status: DC

## 2021-12-05 MED ORDER — SUGAMMADEX SODIUM 200 MG/2ML IV SOLN
INTRAVENOUS | Status: DC | PRN
  Administered 2021-12-05: 200 mg via INTRAVENOUS

## 2021-12-05 MED ORDER — LIDOCAINE HCL (CARDIAC) PF 100 MG/5ML IV SOSY
PREFILLED_SYRINGE | INTRAVENOUS | Status: DC | PRN
  Administered 2021-12-05: 60 mg via INTRAVENOUS

## 2021-12-05 MED ORDER — HYDROMORPHONE HCL 1 MG/ML IJ SOLN
0.2500 mg | INTRAMUSCULAR | Status: DC | PRN
  Administered 2021-12-05 (×4): 0.5 mg via INTRAVENOUS

## 2021-12-05 SURGICAL SUPPLY — 62 items
ADH SKN CLS APL DERMABOND .7 (GAUZE/BANDAGES/DRESSINGS) ×1
BARRIER ADHS 3X4 INTERCEED (GAUZE/BANDAGES/DRESSINGS) IMPLANT
BRR ADH 4X3 ABS CNTRL BYND (GAUZE/BANDAGES/DRESSINGS)
CATH FOLEY 3WAY  5CC 16FR (CATHETERS) ×1
CATH FOLEY 3WAY 5CC 16FR (CATHETERS) ×1 IMPLANT
COVER BACK TABLE 60X90IN (DRAPES) ×2 IMPLANT
COVER TIP SHEARS 8 DVNC (MISCELLANEOUS) ×1 IMPLANT
COVER TIP SHEARS 8MM DA VINCI (MISCELLANEOUS) ×1
DECANTER SPIKE VIAL GLASS SM (MISCELLANEOUS) ×4 IMPLANT
DEFOGGER SCOPE WARMER CLEARIFY (MISCELLANEOUS) ×2 IMPLANT
DERMABOND ADVANCED (GAUZE/BANDAGES/DRESSINGS) ×1
DERMABOND ADVANCED .7 DNX12 (GAUZE/BANDAGES/DRESSINGS) ×1 IMPLANT
DRAPE ARM DVNC X/XI (DISPOSABLE) ×4 IMPLANT
DRAPE COLUMN DVNC XI (DISPOSABLE) ×1 IMPLANT
DRAPE DA VINCI XI ARM (DISPOSABLE) ×4
DRAPE DA VINCI XI COLUMN (DISPOSABLE) ×1
DRAPE UTILITY XL STRL (DRAPES) ×2 IMPLANT
DURAPREP 26ML APPLICATOR (WOUND CARE) ×2 IMPLANT
ELECT REM PT RETURN 9FT ADLT (ELECTROSURGICAL) ×2
ELECTRODE REM PT RTRN 9FT ADLT (ELECTROSURGICAL) ×1 IMPLANT
GAUZE 4X4 16PLY ~~LOC~~+RFID DBL (SPONGE) ×4 IMPLANT
GAUZE VASELINE 3X9 (GAUZE/BANDAGES/DRESSINGS) ×2 IMPLANT
GLOVE SURG ENC MOIS LTX SZ6.5 (GLOVE) ×6 IMPLANT
GLOVE SURG UNDER POLY LF SZ7 (GLOVE) ×10 IMPLANT
HOLDER FOLEY CATH W/STRAP (MISCELLANEOUS) IMPLANT
IRRIG SUCT STRYKERFLOW 2 WTIP (MISCELLANEOUS) ×2
IRRIGATION SUCT STRKRFLW 2 WTP (MISCELLANEOUS) ×1 IMPLANT
KIT TURNOVER CYSTO (KITS) ×2 IMPLANT
LEGGING LITHOTOMY PAIR STRL (DRAPES) ×2 IMPLANT
OBTURATOR OPTICAL STANDARD 8MM (TROCAR) ×1
OBTURATOR OPTICAL STND 8 DVNC (TROCAR) ×1
OBTURATOR OPTICALSTD 8 DVNC (TROCAR) ×1 IMPLANT
OCCLUDER COLPOPNEUMO (BALLOONS) ×2 IMPLANT
PACK ROBOT WH (CUSTOM PROCEDURE TRAY) ×2 IMPLANT
PACK ROBOTIC GOWN (GOWN DISPOSABLE) ×2 IMPLANT
PACK TRENDGUARD 450 HYBRID PRO (MISCELLANEOUS) ×1 IMPLANT
PAD OB MATERNITY 4.3X12.25 (PERSONAL CARE ITEMS) ×2 IMPLANT
PAD PREP 24X48 CUFFED NSTRL (MISCELLANEOUS) ×2 IMPLANT
POUCH ENDO CATCH II 15MM (MISCELLANEOUS) IMPLANT
PROTECTOR NERVE ULNAR (MISCELLANEOUS) ×4 IMPLANT
RTRCTR WOUND ALEXIS 18CM SML (INSTRUMENTS)
SAVER CELL AAL HAEMONETICS (INSTRUMENTS) IMPLANT
SEAL CANN UNIV 5-8 DVNC XI (MISCELLANEOUS) ×4 IMPLANT
SEAL XI 5MM-8MM UNIVERSAL (MISCELLANEOUS) ×4
SET IRRIG Y TYPE TUR BLADDER L (SET/KITS/TRAYS/PACK) IMPLANT
SET TRI-LUMEN FLTR TB AIRSEAL (TUBING) ×2 IMPLANT
SPONGE T-LAP 4X18 ~~LOC~~+RFID (SPONGE) IMPLANT
SUT ETHIBOND 0 (SUTURE) IMPLANT
SUT VIC AB 4-0 PS2 27 (SUTURE) ×6 IMPLANT
SUT VICRYL 0 UR6 27IN ABS (SUTURE) ×2 IMPLANT
SUT VLOC 180 0 9IN  GS21 (SUTURE) ×1
SUT VLOC 180 0 9IN GS21 (SUTURE) ×1 IMPLANT
SUT VLOC 180 2-0 6IN GS21 (SUTURE) IMPLANT
TIP RUMI ORANGE 6.7MMX12CM (TIP) IMPLANT
TIP UTERINE 5.1X6CM LAV DISP (MISCELLANEOUS) IMPLANT
TIP UTERINE 6.7X10CM GRN DISP (MISCELLANEOUS) IMPLANT
TIP UTERINE 6.7X6CM WHT DISP (MISCELLANEOUS) IMPLANT
TIP UTERINE 6.7X8CM BLUE DISP (MISCELLANEOUS) ×2 IMPLANT
TOWEL OR 17X26 10 PK STRL BLUE (TOWEL DISPOSABLE) ×2 IMPLANT
TRENDGUARD 450 HYBRID PRO PACK (MISCELLANEOUS) ×2
TROCAR PORT AIRSEAL 5X120 (TROCAR) ×2 IMPLANT
WATER STERILE IRR 1000ML POUR (IV SOLUTION) ×2 IMPLANT

## 2021-12-05 NOTE — Transfer of Care (Signed)
Immediate Anesthesia Transfer of Care Note  Patient: Sheri Murphy  Procedure(s) Performed: XI ROBOTIC ASSISTED TOTAL HYSTERECTOMY WITH BILATERAL SALPINGECTOMY (Bilateral: Abdomen)  Patient Location: PACU  Anesthesia Type:General  Level of Consciousness: drowsy and responds to stimulation  Airway & Oxygen Therapy: Patient Spontanous Breathing and Patient connected to face mask oxygen  Post-op Assessment: Report given to RN and Post -op Vital signs reviewed and stable  Post vital signs: Reviewed and stable  Last Vitals:  Vitals Value Taken Time  BP 198/101 12/05/21 1430  Temp 36.6 C 12/05/21 1430  Pulse 91 12/05/21 1432  Resp 23 12/05/21 1432  SpO2 97 % 12/05/21 1432  Vitals shown include unvalidated device data.  Last Pain:  Vitals:   12/05/21 0951  TempSrc: Oral      Patients Stated Pain Goal: 4 (99/24/26 8341)  Complications: No notable events documented.

## 2021-12-05 NOTE — Op Note (Signed)
Operative Note  12/05/2021  2:47 PM  PATIENT:  Sheri Murphy  55 y.o. female  PRE-OPERATIVE DIAGNOSIS:  severe recurrent pelvic pain, fibroids, probable adenomyosis  POST-OPERATIVE DIAGNOSIS:  severe recurrent pelvic pain, fibroids, probable adenomyosis  PROCEDURE:  Procedure(s): XI ROBOTIC ASSISTED TOTAL HYSTERECTOMY WITH BILATERAL SALPINGECTOMY  SURGEON:  Surgeon(s): [name], MD  ANESTHESIA:   general  FINDINGS: Soft enlarged uterus typical of Adenomyosis with Fibroids.  Normal tubes.  Normal ovaries.  DESCRIPTION OF OPERATION: Under general anesthesia with endotracheal intubation the patient is in lithotomy position.  She is prepped with DuraPrep on the abdomen and Betadine on the suprapubic, vulvar and vaginal areas.  The patient is draped as usual.  Timeout is done.  The Foley is inserted in the bladder.  The vaginal exam reveals an anteverted uterus, increased in volume and nodular.  No adnexal mass felt.  The weighted speculum is inserted in the vagina and the anterior lip of the cervix is grasped with a tenaculum.  Dilation of the cervix with Pratt dilators up to #19.  The hysterometry is at 8 cm.  We used a #8 Rumi with the medium Koh ring.  Those are put in place easily.  The other instruments are removed.  The weighted speculum is removed.  We go to the abdomen.  The supraumbilical area is infiltrated with Marcaine one quarter plain.  We make a 1.5 cm incision with a scalpel at that level.  The aponeurosis is grasped with cokers and incised with Mayo scissors under direct vision.  The parietal peritoneum is open with Mayo scissors under direct vision.  A pursestring stitch of Vicryl 0 is done on the aponeurosis.  The [facility] port is inserted at that level under direct vision.  Up pneumoperitoneum is created with CO2.  The camera is inserted at that level.  The abdomen is normal to inspection.  Pictures are taken of the liver.  In the pelvic cavity, we note a uterus with  uterine fibroids and soft boggy appearance compatible with adenomyosis.  Both tubes and both ovaries are normal.  Pictures are taken.  We marked the skin for port placement.  Marcaine one quarter plain is infiltrated at each level.  Small incisions are made with the scalpel.  All ports are inserted under direct vision.  2 robotic ports are inserted in line with the umbilicus on the right side.  1 robotic port is inserted on the left side distally in line with the umbilicus.  1 assistant port, 5 mm, is inserted just above the umbilical line medially.  The patient is positioned in 30 degree Trendelenburg.  The robot is docked.  The robotic instruments are inserted under direct vision with a pro grasp in the fourth arm, the scissors in the third arm, the camera in the second arm and the long bipolar in the first arm.  We go to the console.  We start on the right side with cauterization and sectioned of the right mesosalpinx.  We cauterized and sectioned the right utero-ovarian ligament.  We cauterized and sectioned the right round ligament.  We open the broad ligament close to the uterus.  We open the visceral peritoneum to the midline and start lowering the bladder.  We proceeded exactly the same way on the left side.  We further lowered the bladder past the [facility] ring.  We cauterized the left uterine artery and the backflow.  We cauterized the right uterine artery and the backflow.  We cut the right uterine  artery.  We go back to the left, cauterized further and cut the left uterine artery.  The vaginal occluder is inflated.  The vagina was opened on top of the [facility] ring with the tip of the scissors starting anteriorly, then laterally and finishing posteriorly.  The uterus was completely detached with both tubes and the cervix.  It was cut into pieces to be passed vaginally.  The specimen was sent to pathology.  The vaginal occluder was put back in place.  Hemostasis was completed at the vaginal vault.  We changed  instruments to the cutting needle driver in the third arm and the long tip in the first arm.  We connected the pro grasp to electric current.  The vaginal vault was closed with a running suture of V-Loc 0 starting at the right angle, all the way to the left angle and back.  That produced a very good closure with good hemostasis.  Hemostasis was adequate at all levels.  The pelvis was irrigated and suctioned.  A picture was taken of the vaginal vault and ovaries.  Both ureters were seen with good peristalsis.  Robotic instruments were removed under direct vision.  The robot was undocked.  The Trendelenburg was reversed.  No fluid had to be suctioned.  All ports were removed.  The CO2 was evacuated.  The pursestring stitch was attached at the supraumbilical incision.  All incisions were closed with a subcuticular suture of Vicryl 4-0.  Dermabond was added on all incisions.  Good hemostasis was present.  The vaginal occluder was removed.  No vaginal bleeding.  The patient was brought to recovery room in good and stable status.  ESTIMATED BLOOD LOSS: 50 mL   Intake/Output Summary (Last 24 hours) at 12/05/2021 1447 Last data filed at 12/05/2021 1410 Gross per 24 hour  Intake 900 ml  Output 100 ml  Net 800 ml     BLOOD ADMINISTERED:none   LOCAL MEDICATIONS USED:  MARCAINE     SPECIMEN:  Source of Specimen:  Uterus with cervix, bilateral tubes  DISPOSITION OF SPECIMEN:  PATHOLOGY  COUNTS:  YES  PLAN OF CARE: Transfer to PACU  Marie-Lyne LavoieMD2:47 PM

## 2021-12-05 NOTE — Anesthesia Postprocedure Evaluation (Signed)
Anesthesia Post Note  Patient: Sheri Murphy  Procedure(s) Performed: XI ROBOTIC ASSISTED TOTAL HYSTERECTOMY WITH BILATERAL SALPINGECTOMY (Bilateral: Abdomen)     Patient location during evaluation: PACU Anesthesia Type: General Level of consciousness: awake and alert Pain management: pain level controlled Vital Signs Assessment: post-procedure vital signs reviewed and stable Respiratory status: spontaneous breathing, nonlabored ventilation, respiratory function stable and patient connected to nasal cannula oxygen Cardiovascular status: blood pressure returned to baseline and stable Postop Assessment: no apparent nausea or vomiting Anesthetic complications: no   No notable events documented.  Last Vitals:  Vitals:   12/05/21 1600 12/05/21 1645  BP: (!) 172/90 (!) 181/92  Pulse: 97 97  Resp: 18 16  Temp:  36.4 C  SpO2: 97% 98%    Last Pain:  Vitals:   12/05/21 1645  TempSrc:   PainSc: 6                  Tyera Hansley L Deshun Sedivy

## 2021-12-05 NOTE — H&P (Signed)
Sheri Murphy is an 55 y.o. female.   [id number]    RP: XI Robotic TLH/Bilateral Salpingectomy   HPI:  No change x last visit 11/06/2021.  Symptomatic Fibroids with recurrent, severe acute pelvic crampy pain.  DepoProvera received in 07/2021.  No vaginal bleeding.  H/O Endometrial Ablation.    Pertinent Gynecological History: Menses: post-menopausal Contraception: post menopausal status Blood transfusions: none Sexually transmitted diseases:  Previous GYN Procedures:  Endometrial Ablation   Last mammogram: normal  Last pap: normal    Menstrual History: No LMP recorded (lmp unknown). Patient has had an ablation.    Past Medical History:  Diagnosis Date   Abnormal liver function 01/08/2016   Anemia    Anxiety    Constipation 11/28/2021   Controlled type 2 diabetes mellitus without complication ([place])    Fatty liver    hx of elevated liver enzymes in past   Fibroids    Fibromyalgia    GERD (gastroesophageal reflux disease)    Heart murmur    mild no cardiologist   Hematuria    History of COVID-19 04/2021   fever cough and cold symptoms, took oral antivirals, all symptoms resolved   Hypertension    IBS (irritable bowel syndrome)    Insomnia    Menorrhagia    Obesity    Pelvic pain 11/28/2021   recurrent   Renal cell carcinoma, left ([place]) 2018   surgery only chemo/radiation not required, small spot removed   Urinary incontinence 11/28/2021   weras small pad   Urinary urgency    Vitamin D deficiency    Wears glasses     Past Surgical History:  Procedure Laterality Date   BREAST BIOPSY Right 2021   benign   COLONOSCOPY  2021   DILATATION & CURETTAGE/HYSTEROSCOPY WITH MYOSURE N/A 05/13/2018   Procedure: DILATATION & CURETTAGE/HYSTEROSCOPY WITH NOVASURE;  Surgeon: [name], MD;  Location: [place];  Service: Gynecology;  Laterality: N/A;   Left renal mass removal  07/2017   Thermal Microwave Ablation   SKIN LESION EXCISION  2012   basal cell  removed no problems since, sees dermatology prn now   uterine ablation  2018   WISDOM TOOTH EXTRACTION     yrs ago per pt on 11-28-2021    Family History  Problem Relation Age of Onset   Cancer Mother 47       COLON   Hypertension Mother    Hypertension Father    Hypertension Maternal Aunt    Cancer Maternal Grandmother        small intestine    Hypertension Maternal Grandmother    Cancer Maternal Grandfather        pancreatic   Diabetes Paternal Grandmother     Social History:  reports that she has never smoked. She has never used smokeless tobacco. She reports current alcohol use. She reports that she does not use drugs.  Allergies:  Allergies  Allergen Reactions   Amitriptyline Other (See Comments)    nightmares   Chlordiazepoxide Other (See Comments)    nightmares   Macrodantin [Nitrofurantoin] Hives    Medications Prior to Admission  Medication Sig Dispense Refill Last Dose   acetaminophen (TYLENOL) 500 MG tablet Take 500 mg by mouth every 6 (six) hours as needed for mild pain.   Past Week   bismuth subsalicylate (PEPTO BISMOL) 262 MG/15ML suspension Take 30 mLs by mouth every 6 (six) hours as needed.   Past Week   bisoprolol-hydrochlorothiazide (ZIAC) 10-6.25 MG tablet Take  1 tablet by mouth daily.   12/04/2021 at 2200   CHLORPHENIRAMINE MALEATE CR PO Take by mouth as needed. has Dextromethorchan also in tablet   Past Week   ibuprofen (ADVIL) 600 MG tablet TAKE 1 TABLET(600 MG) BY MOUTH EVERY 8 HOURS AS NEEDED 60 tablet 3 Past Week   levocetirizine (XYZAL) 5 MG tablet Take 5 mg by mouth daily.   12/04/2021   lidocaine (LIDODERM) 5 % Place 1 patch onto the skin daily. Remove & Discard patch within 12 hours or as directed by MD (Patient taking differently: Place 1 patch onto the skin as needed. Remove & Discard patch within 12 hours or as directed by MD) 30 patch 0 Past Week   metFORMIN (GLUCOPHAGE) 500 MG tablet Take 1,000 mg by mouth at bedtime.   12/04/2021   omeprazole  (PRILOSEC) 40 MG capsule Take 40 mg by mouth daily.   12/05/2021 at [place] Name: berbine 1 tab 3 x daily for constipation   Past Week at 800   VITAMIN D PO Take 5,000 Int'l Units by mouth.   Past Week   Continuous Blood Gluc Receiver (FREESTYLE LIBRE READER) DEVI by Does not apply route.       REVIEW OF SYSTEMS: A ROS was performed and pertinent positives and negatives are included in the history.  GENERAL: No fevers or chills. HEENT: No change in vision, no earache, sore throat or sinus congestion. NECK: No pain or stiffness. CARDIOVASCULAR: No chest pain or pressure. No palpitations. PULMONARY: No shortness of breath, cough or wheeze. GASTROINTESTINAL: No abdominal pain, nausea, vomiting or diarrhea, melena or bright red blood per rectum. GENITOURINARY: No urinary frequency, urgency, hesitancy or dysuria. MUSCULOSKELETAL: No joint or muscle pain, no back pain, no recent trauma. DERMATOLOGIC: No rash, no itching, no lesions. ENDOCRINE: No polyuria, polydipsia, no heat or cold intolerance. No recent change in weight. HEMATOLOGICAL: No anemia or easy bruising or bleeding. NEUROLOGIC: No headache, seizures, numbness, tingling or weakness. PSYCHIATRIC: No depression, no loss of interest in normal activity or change in sleep pattern.     Blood pressure (!) 161/87, pulse 75, temperature 98.9 F (37.2 C), temperature source Oral, resp. rate 17, height 5\' 5"  (1.651 m), weight 79.4 kg, SpO2 96 %.  Physical Exam:   Results for orders placed or performed during the hospital encounter of 12/05/21 (from the past 24 hour(s))  Pregnancy, urine POC     Status: None   Collection Time: 12/05/21  9:29 AM  Result Value Ref Range   Preg Test, Ur NEGATIVE NEGATIVE  ABO/Rh     Status: None   Collection Time: 12/05/21 10:10 AM  Result Value Ref Range   ABO/RH(D)      O POS Performed at [facility], [place] [address] [place], [place] [address]   CBC     Status: Abnormal    Collection Time: 12/05/21 10:10 AM  Result Value Ref Range   WBC 8.5 4.0 - 10.5 K/uL   RBC 4.81 3.87 - 5.11 MIL/uL   Hemoglobin 12.5 12.0 - 15.0 g/dL   HCT 37.7 36.0 - 46.0 %   MCV 78.4 (L) 80.0 - 100.0 fL   MCH 26.0 26.0 - 34.0 pg   MCHC 33.2 30.0 - 36.0 g/dL   RDW 14.5 11.5 - 15.5 %   Platelets 345 150 - 400 K/uL   nRBC 0.0 0.0 - 0.2 %  Glucose, capillary     Status: Abnormal   Collection  Time: 12/05/21 10:27 AM  Result Value Ref Range   Glucose-Capillary 106 (H) 70 - 99 mg/dL   Pelvic US 08/2021:  T/V images.  Anteverted uterus enlarged with irregular shape.  The uterus is measured at 10.21 x 6.31 x 8.8 cm.  Multiple small fibroids subserous and intramural, the largest is measured at 2.9 x 3.3 cm anteriorly and subserous.  Symmetrical endometrial lining partially visualized.  The cavity is distorted by multiple adjacent fibroids.  The endometrial lining is measured at 5.06 mm status post endometrial ablation.  No obvious mass seen.  Left ovary with an avascular dominant follicle measured at 1.2 cm.  Right ovary with sparse follicles seen.  Bilateral good ovarian perfusion.  No adnexal mass seen.  No free fluid in the posterior cul-de-sac.     Assessment/Plan:  55 y.o. G3P0020    1. Pelvic pain in female Persistent recurrent pelvic pain with probable Adenomyosis and Uterine Fibroids.  Counseling done.  Decision to proceed with XI Robotic TLH/Bilateral Salpingectomy.  Preop preparation, Surgery and risks, postop expectations and precautions thoroughly reviewed.     2. Adenomyosis of the uterus Probable Adenomyosis with severe uterine cramping and recurrent pain.   3. Fibroids Sxic Fibroids.   4. S/P endometrial ablation  No menses, but persistent recurrent uterine cramping.                         Patient was counseled as to the risk of surgery to include the following:   1. Infection (prohylactic antibiotics will be administered)   2. DVT/Pulmonary Embolism (prophylactic  pneumo compression stockings will be used)   3.Trauma to internal organs requiring additional surgical procedure to repair any injury to internal organs requiring perhaps additional hospitalization days.   4.Hemmorhage requiring transfusion and blood products which carry risks such as anaphylactic reaction, hepatitis and AIDS   Patient had received literature information on the procedure scheduled and all her questions were answered and fully accepts all risk.   Marie-Lyne Shanyiah Conde 12/05/2021, 11:21 AM

## 2021-12-05 NOTE — Anesthesia Procedure Notes (Signed)
Procedure Name: Intubation Date/Time: 12/05/2021 12:13 PM Performed by: Suan Halter, CRNA Pre-anesthesia Checklist: Patient identified, Emergency Drugs available, Suction available and Patient being monitored Patient Re-evaluated:Patient Re-evaluated prior to induction Oxygen Delivery Method: Circle system utilized Preoxygenation: Pre-oxygenation with 100% oxygen Induction Type: IV induction Ventilation: Mask ventilation without difficulty Laryngoscope Size: Mac and 4 Grade View: Grade I Tube type: Oral Tube size: 7.0 mm Number of attempts: 1 Airway Equipment and Method: Stylet and Oral airway Placement Confirmation: ETT inserted through vocal cords under direct vision, positive ETCO2 and breath sounds checked- equal and bilateral Secured at: 22 cm Tube secured with: Tape Dental Injury: Teeth and Oropharynx as per pre-operative assessment

## 2021-12-06 ENCOUNTER — Encounter (HOSPITAL_BASED_OUTPATIENT_CLINIC_OR_DEPARTMENT_OTHER): Payer: Self-pay | Admitting: Obstetrics & Gynecology

## 2021-12-06 DIAGNOSIS — D251 Intramural leiomyoma of uterus: Secondary | ICD-10-CM | POA: Diagnosis not present

## 2021-12-06 LAB — SURGICAL PATHOLOGY

## 2021-12-06 LAB — GLUCOSE, CAPILLARY: Glucose-Capillary: 112 mg/dL — ABNORMAL HIGH (ref 70–99)

## 2021-12-06 MED ORDER — OXYCODONE-ACETAMINOPHEN 5-325 MG PO TABS
ORAL_TABLET | ORAL | Status: AC
  Filled 2021-12-06: qty 2

## 2021-12-06 MED ORDER — OXYCODONE-ACETAMINOPHEN 7.5-325 MG PO TABS: 1.0000 | ORAL_TABLET | ORAL | 0 refills | Status: DC | PRN

## 2021-12-06 NOTE — Progress Notes (Signed)
POD#1 XI Robotic TLH/BS Subjective: Patient reports tolerating PO, + flatus, and no problems voiding.    Objective: I have reviewed patient's vital signs.  vital signs, intake and output, medications, and labs.  Vitals:   12/06/21 0215 12/06/21 0630  BP: 125/71 118/72  Pulse: 91 86  Resp: 12 10  Temp: 98.5 F (36.9 C) 98.5 F (36.9 C)  SpO2: 99% 100%   I/O last 3 completed shifts: In: 1540 [P.O.:240; I.V.:1175; IV Piggyback:100] Out: 28 [Urine:3900; Emesis/NG output:150; Blood:50] Total I/O In: -  Out: 300 [Urine:300]  Results for orders placed or performed during the hospital encounter of 12/05/21 (from the past 24 hour(s))  Pregnancy, urine POC     Status: None   Collection Time: 12/05/21  9:29 AM  Result Value Ref Range   Preg Test, Ur NEGATIVE NEGATIVE  ABO/Rh     Status: None   Collection Time: 12/05/21 10:10 AM  Result Value Ref Range   ABO/RH(D)      O POS Performed at [facility], [place] [address], [place], [place] [address]   CBC     Status: Abnormal   Collection Time: 12/05/21 10:10 AM  Result Value Ref Range   WBC 8.5 4.0 - 10.5 K/uL   RBC 4.81 3.87 - 5.11 MIL/uL   Hemoglobin 12.5 12.0 - 15.0 g/dL   HCT 37.7 36.0 - 46.0 %   MCV 78.4 (L) 80.0 - 100.0 fL   MCH 26.0 26.0 - 34.0 pg   MCHC 33.2 30.0 - 36.0 g/dL   RDW 14.5 11.5 - 15.5 %   Platelets 345 150 - 400 K/uL   nRBC 0.0 0.0 - 0.2 %  Glucose, capillary     Status: Abnormal   Collection Time: 12/05/21 10:27 AM  Result Value Ref Range   Glucose-Capillary 106 (H) 70 - 99 mg/dL  Glucose, capillary     Status: Abnormal   Collection Time: 12/05/21  2:36 PM  Result Value Ref Range   Glucose-Capillary 129 (H) 70 - 99 mg/dL  CBC     Status: Abnormal   Collection Time: 12/05/21  4:17 PM  Result Value Ref Range   WBC 14.3 (H) 4.0 - 10.5 K/uL   RBC 5.01 3.87 - 5.11 MIL/uL   Hemoglobin 13.0 12.0 - 15.0 g/dL   HCT 39.3 36.0 - 46.0 %   MCV 78.4 (L) 80.0 - 100.0 fL   MCH 25.9 (L) 26.0 -  34.0 pg   MCHC 33.1 30.0 - 36.0 g/dL   RDW 14.6 11.5 - 15.5 %   Platelets 237 150 - 400 K/uL   nRBC 0.0 0.0 - 0.2 %  Glucose, capillary     Status: Abnormal   Collection Time: 12/05/21  9:55 PM  Result Value Ref Range   Glucose-Capillary 143 (H) 70 - 99 mg/dL  Glucose, capillary     Status: Abnormal   Collection Time: 12/06/21  6:38 AM  Result Value Ref Range   Glucose-Capillary 112 (H) 70 - 99 mg/dL    EXAM General: alert, cooperative, and appears stated age Resp: clear to auscultation bilaterally Cardio: regular rate and rhythm GI: soft, non-tender; bowel sounds normal; no masses,  no organomegaly and incision: clean, dry, and intact Extremities: no edema, redness or tenderness in the calves or thighs Vaginal Bleeding: none  Assessment: s/p Procedure(s): XI ROBOTIC ASSISTED TOTAL HYSTERECTOMY WITH BILATERAL SALPINGECTOMY: stable, progressing well, and tolerating diet  Plan: Discharge home  LOS: 0 days    [name], MD  12/06/2021 7:47 AM

## 2021-12-06 NOTE — Discharge Summary (Signed)
Physician Discharge Summary  Patient ID: Sheri Murphy MRN: [id number] DOB/AGE: 1966/08/08 55 y.o.  Admit date: 12/05/2021 Discharge date: 12/06/2021  Admission Diagnoses: Postoperative state [Z98.890]   Discharge Diagnoses:  Principal Problem:   Postoperative state   Discharged Condition: good  Consults:None  Significant Diagnostic Studies: labs: Postop Hb 13.0  Treatments:surgery: XI Robotic Total Laparoscopic Hysterectomy with Bilateral Salpingectomy  Vitals:   12/06/21 0215 12/06/21 0630  BP: 125/71 118/72  Pulse: 91 86  Resp: 12 10  Temp: 98.5 F (36.9 C) 98.5 F (36.9 C)  SpO2: 99% 100%     Total I/O In: -  Out: [facility] Course: Good  Discharge Exam: Normal Postop  Disposition: Discharge disposition: 01-Home or Self Care       Discharge Instructions     Discharge patient   Complete by: As directed    Discharge disposition: 01-Home or Self Care   Discharge patient date: 12/06/2021        Allergies as of 12/06/2021       Reactions   Amitriptyline Other (See Comments)   nightmares   Chlordiazepoxide Other (See Comments)   nightmares   Macrodantin [nitrofurantoin] Hives        Medication List     TAKE these medications    acetaminophen 500 MG tablet Commonly known as: TYLENOL Take 500 mg by mouth every 6 (six) hours as needed for mild pain.   bismuth subsalicylate 435 W[place]/61UO suspension Commonly known as: PEPTO BISMOL Take 30 mLs by mouth every 6 (six) hours as needed.   bisoprolol-hydrochlorothiazide 10-6.25 MG tablet Commonly known as: ZIAC Take 1 tablet by mouth daily.   CHLORPHENIRAMINE MALEATE CR PO Take by mouth as needed. has Dextromethorchan also in tablet   FreeStyle Libre Reader [place] by Does not apply route.   ibuprofen 600 MG tablet Commonly known as: ADVIL TAKE 1 TABLET(600 MG) BY MOUTH EVERY 8 HOURS AS NEEDED   levocetirizine 5 MG tablet Commonly known as: XYZAL Take 5 mg by mouth daily.    lidocaine 5 % Commonly known as: Lidoderm Place 1 patch onto the skin daily. Remove & Discard patch within 12 hours or as directed by MD What changed:  when to take this reasons to take this   metFORMIN 500 MG tablet Commonly known as: GLUCOPHAGE Take 1,000 mg by mouth at bedtime.   omeprazole 40 MG capsule Commonly known as: PRILOSEC Take 40 mg by mouth daily.   oxyCODONE-acetaminophen 7.5-325 MG tablet Commonly known as: Percocet Take 1 tablet by mouth every 4 (four) hours as needed for severe pain.   UNABLE TO FIND Med Name: berbine 1 tab 3 x daily for constipation   VITAMIN D PO Take 5,000 Int'l Units by mouth.          Follow-up Information     [name], MD Follow up in 2 week(s).   Specialty: Obstetrics and Gynecology Contact information: [address] [place] [address] [phone]                  Signed: [name] 12/06/2021, 8:32 AM

## 2021-12-10 ENCOUNTER — Telehealth: Payer: Self-pay

## 2021-12-10 NOTE — Telephone Encounter (Signed)
Patient had Robotic Assisted Total Hysterectomy on 12/05/21.  She called today to say she is doing very well. Feels good. No fever. She is concerned about the left side incision as it feels hard all the way around it for an inch to inch and a half outwards. Very uncomfortable. No redness, not hot to touch and no drainage. She notes that none of the other incisions have this.   Also, how long does she need to keep using the Spirometer for breathing that they sent her home from hospital with?

## 2021-12-10 NOTE — Telephone Encounter (Signed)
Patient advised.

## 2021-12-11 ENCOUNTER — Telehealth: Payer: Self-pay

## 2021-12-11 NOTE — Addendum Note (Signed)
Addended by: Ramond Craver on: 12/11/2021 04:11 PM   Modules accepted: Orders

## 2021-12-11 NOTE — Telephone Encounter (Signed)
Dr.Lavoie patient called back today stating  left side incision area is feeling better,using ice pack and will continue to monitor for now.   She also asked if a "few" Ambien tablets can be sent to pharmacy for her. She has not had a full night of sleep in days.   Please advise

## 2021-12-11 NOTE — Telephone Encounter (Signed)
Okay I will let her know. Can you put the Rx in and sign it? Patient has not had Rx before. Thanks!

## 2021-12-11 NOTE — Telephone Encounter (Signed)
Patient called c/o vaginal itching and questioning if okay to take the Diflucan she has on hand?    Surgery 12/05/21.

## 2021-12-13 ENCOUNTER — Other Ambulatory Visit: Payer: Self-pay | Admitting: Obstetrics & Gynecology

## 2021-12-13 MED ORDER — ZOLPIDEM TARTRATE 5 MG PO TABS: 5.0000 mg | ORAL_TABLET | Freq: Every evening | ORAL | 1 refills | Status: DC | PRN

## 2021-12-13 NOTE — Telephone Encounter (Signed)
Patient informed. 

## 2021-12-13 NOTE — Telephone Encounter (Signed)
Sheri Bruins, MD  You 15 minutes ago (9:16 AM)   Yes, agree with her taking Fluconazole    Patient informed. Also informed Ambien Rx sent today.

## 2021-12-17 ENCOUNTER — Ambulatory Visit (INDEPENDENT_AMBULATORY_CARE_PROVIDER_SITE_OTHER): Payer: BC Managed Care – PPO | Admitting: Obstetrics & Gynecology

## 2021-12-17 ENCOUNTER — Encounter: Payer: Self-pay | Admitting: Obstetrics & Gynecology

## 2021-12-17 ENCOUNTER — Other Ambulatory Visit: Payer: Self-pay

## 2021-12-17 VITALS — BP 132/88 | HR 75

## 2021-12-17 DIAGNOSIS — Z09 Encounter for follow-up examination after completed treatment for conditions other than malignant neoplasm: Secondary | ICD-10-CM

## 2021-12-17 NOTE — Progress Notes (Signed)
° ° °  Sheri Murphy [birth year] [id number]        55 y.o.  G3P0A3L0  RP: Postop XI Robotic TLH/BS on 12/05/2021  HPI: Postop XI Robotic TLH/BS on 12/05/2021.  Mild tenderness, improving at left distal incision and itching at left medial incision.  No redness, no drainage.  No fever.  No abdominopelvic pain currently.  Weaning Ibuprofen.  No vaginal bleeding or discharge.   OB History  Gravida Para Term Preterm AB Living  3 0     2 0  SAB IAB Ectopic Multiple Live Births  1            # Outcome Date GA Lbr Len/2nd Weight Sex Delivery Anes PTL Lv  3 Gravida           2 AB           1 SAB             Past medical history,surgical history, problem list, medications, allergies, family history and social history were all reviewed and documented in the EPIC chart.   Directed ROS with pertinent positives and negatives documented in the history of present illness/assessment and plan.  Exam:  Vitals:   12/17/21 1131  BP: 132/88  Pulse: 75  SpO2: 98%   General appearance:  Normal  Abdomen: Incisions healing well with no sign of infection.  Mild reaction to glue at left incisions.  Abdomen soft, NT.  Gynecologic exam: Vulva normal.  Speculum:  Vaginal vault well closed.  Normal secretions.  No bleeding.   Assessment/Plan:  55 y.o. G3P0020   1. Status post gynecological surgery, follow-up exam  Postop XI Robotic TLH/BS on 12/05/2021.  Mild tenderness, improving at left distal incision and itching at left medial incision.  No redness, no drainage.  No fever.  No abdominopelvic pain currently.  Weaning Ibuprofen.  No vaginal bleeding or discharge.  Recommend removing the Dermabond glue applying Vaseline and then gently wiping it off.  Precautions reviewed.  F/U in 4 weeks.  [name] MD, 11:54 AM 12/17/2021

## 2021-12-17 NOTE — Telephone Encounter (Signed)
Patient has post op today "Postop XI Robotic TLH/BS on 12/05/2021"

## 2021-12-18 ENCOUNTER — Telehealth: Payer: Self-pay | Admitting: *Deleted

## 2021-12-18 NOTE — Telephone Encounter (Signed)
Patient called Postop XI Robotic TLH/BS on 12/05/2021, had visit on 12/17/21. Told to call if she notices anything "different" about her incisions. Patient said yesterday she put Vaseline on incisions as directed, reports most of glue has been removed. Patient said the belly button incision has "crust" on outer layer from what sounds like she had small amount of drainage that now has dried. Reports everything else looks fine. I told patient I would relay information to you. Please advise

## 2021-12-18 NOTE — Telephone Encounter (Signed)
Patient informed. 

## 2021-12-20 ENCOUNTER — Encounter: Payer: BC Managed Care – PPO | Admitting: Obstetrics & Gynecology

## 2022-01-02 ENCOUNTER — Telehealth: Payer: Self-pay | Admitting: *Deleted

## 2022-01-02 NOTE — Telephone Encounter (Signed)
Patient called XI Robotic TLH/BS on 12/05/2021. Has her 6 week post op scheduled on 01/15/22. Patient said she feels ready to return to work and was hoping she can return on 01/07/22. 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 Please advise how you wish to proceed, patient said if you want her to wait until 6 week post op on 01/15/22 she will. Please advise

## 2022-01-02 NOTE — Telephone Encounter (Signed)
Per Dr.Lavoie "Yes, she can return to work on 01/07/2022."  Encounter sent to [facility] to provide patient with this letter.

## 2022-01-03 ENCOUNTER — Encounter: Payer: Self-pay | Admitting: Obstetrics & Gynecology

## 2022-01-03 NOTE — Telephone Encounter (Signed)
Return to work letter sent to patient via [place].   Encounter closed.

## 2022-01-15 ENCOUNTER — Other Ambulatory Visit: Payer: Self-pay

## 2022-01-15 ENCOUNTER — Encounter: Payer: Self-pay | Admitting: Obstetrics & Gynecology

## 2022-01-15 ENCOUNTER — Ambulatory Visit (INDEPENDENT_AMBULATORY_CARE_PROVIDER_SITE_OTHER): Payer: BC Managed Care – PPO | Admitting: Obstetrics & Gynecology

## 2022-01-15 VITALS — BP 110/80

## 2022-01-15 DIAGNOSIS — R309 Painful micturition, unspecified: Secondary | ICD-10-CM | POA: Diagnosis not present

## 2022-01-15 DIAGNOSIS — Z09 Encounter for follow-up examination after completed treatment for conditions other than malignant neoplasm: Secondary | ICD-10-CM

## 2022-01-15 MED ORDER — SULFAMETHOXAZOLE-TRIMETHOPRIM 800-160 MG PO TABS: 1.0000 | ORAL_TABLET | Freq: Two times a day (BID) | ORAL | 0 refills | Status: AC

## 2022-01-15 NOTE — Progress Notes (Addendum)
° ° °  Sheri Murphy Jun 18, 1966 [id number]        56 y.o.  G3P0020   RP: Postop XI Robotic TLH/BS on 12/05/2021   HPI: Postop XI Robotic TLH/BS on 12/05/2021.  Incisions well healed.  No abdominopelvic pain.  No vaginal bleeding or discharge.  C/O pain at the end of urination.  BMs normal.  No fever.  OB History  Gravida Para Term Preterm AB Living  3 0     2 0  SAB IAB Ectopic Multiple Live Births  1            # Outcome Date GA Lbr Len/2nd Weight Sex Delivery Anes PTL Lv  3 Gravida           2 AB           1 SAB             Past medical history,surgical history, problem list, medications, allergies, family history and social history were all reviewed and documented in the EPIC chart.   Directed ROS with pertinent positives and negatives documented in the history of present illness/assessment and plan.  Exam:  Vitals:   01/15/22 1518  BP: 110/80   General appearance:  Normal  Abdomen: Incisions well healed, closed with no erythema or induration.  Suture trimmed at left distal incision.  Gynecologic exam: Vulva normal.  Bimanual exam:  Vaginal vault well healed, closed, NT.  No pelvic mass felt, NT.  No blood or discharge.  U/A: Yellow cloudy, Nit Neg, Pro Neg, WBC 0-5, RBC 20-40, Bacteria Few.  U. Culture pending.   Assessment/Plan:  56 y.o. G3P0020   1. Status post gynecological surgery, follow-up exam  Postop XI Robotic TLH/BS on 12/05/2021.  Incisions well healed.  No abdominopelvic pain.  No vaginal bleeding or discharge.  C/O pain at the end of urination.  BMs normal.  No fever.  Normal postop exam.  Good healing, no Cx.  Precautions reviewed.  Can resume sexual activity at 8 wks postop.  2. Pain with urination Probable acute cystitis.  Will treat with Bactrim DS.  Usage reviewed, prescription sent to pharmacy.  Pending U. Culture. - Urinalysis,Complete w/RFL Culture  Other orders - triamcinolone cream (KENALOG) 0.1 %; SMARTSIG:Topical 1-3 Times Daily  -  sulfamethoxazole-trimethoprim (BACTRIM DS) 800-160 MG tablet; Take 1 tablet by mouth 2 (two) times daily for 3 days.   [name] MD, 3:21 PM 01/15/2022

## 2022-01-17 ENCOUNTER — Encounter: Payer: Self-pay | Admitting: Obstetrics & Gynecology

## 2022-01-17 LAB — URINALYSIS, COMPLETE W/RFL CULTURE
Bilirubin Urine: NEGATIVE
Casts: NONE SEEN /LPF
Crystals: NONE SEEN /HPF
Glucose, UA: NEGATIVE
Ketones, ur: NEGATIVE
Leukocyte Esterase: NEGATIVE
Nitrites, Initial: NEGATIVE
Protein, ur: NEGATIVE
Specific Gravity, Urine: 1.01 (ref 1.001–1.035)
Yeast: NONE SEEN /HPF
pH: 6 (ref 5.0–8.0)

## 2022-01-17 LAB — URINE CULTURE
MICRO NUMBER:: 12885988
Result:: NO GROWTH
SPECIMEN QUALITY:: ADEQUATE

## 2022-01-17 LAB — CULTURE INDICATED

## 2022-01-18 NOTE — Telephone Encounter (Signed)
Form received from [facility].  To Dr. Dellis Filbert to review and sign.

## 2022-01-22 ENCOUNTER — Encounter: Payer: Self-pay | Admitting: Obstetrics & Gynecology

## 2022-04-17 ENCOUNTER — Encounter: Payer: Self-pay | Admitting: Obstetrics & Gynecology

## 2022-04-17 ENCOUNTER — Ambulatory Visit (INDEPENDENT_AMBULATORY_CARE_PROVIDER_SITE_OTHER): Payer: BC Managed Care – PPO | Admitting: Obstetrics & Gynecology

## 2022-04-17 VITALS — BP 116/74 | Temp 99.0°F

## 2022-04-17 DIAGNOSIS — R309 Painful micturition, unspecified: Secondary | ICD-10-CM | POA: Diagnosis not present

## 2022-04-17 DIAGNOSIS — R1030 Lower abdominal pain, unspecified: Secondary | ICD-10-CM | POA: Diagnosis not present

## 2022-04-17 DIAGNOSIS — N898 Other specified noninflammatory disorders of vagina: Secondary | ICD-10-CM | POA: Diagnosis not present

## 2022-04-17 LAB — WET PREP FOR TRICH, YEAST, CLUE

## 2022-04-17 NOTE — Addendum Note (Signed)
Addended by: [name] on: 04/17/2022 03:10 PM ? ? Modules accepted: Orders ? ?

## 2022-04-17 NOTE — Progress Notes (Addendum)
? ? ?  Sheri Murphy 1966-03-30 [id number] ? ? ?     56 y.o.  G3P0A3  ? ?RP: Intermittent abdominopelvic discomfort x a few months ? ?HPI: S/P Robotic TLH/BS in 11/2021. Intermittent mid abdomen and pelvic discomfort not associated with movement.  Tendency for constipation. C/O Vaginal itching and burning at the end of urination x a few days.  No fever. ? ? ?OB History  ?Gravida Para Term Preterm AB Living  ?3 0     2 0  ?SAB IAB Ectopic Multiple Live Births  ?1          ?  ?# Outcome Date GA Lbr Len/2nd Weight Sex Delivery Anes PTL Lv  ?3 Gravida           ?2 AB           ?1 SAB           ? ? ?Past medical history,surgical history, problem list, medications, allergies, family history and social history were all reviewed and documented in the EPIC chart. ? ? ?Directed ROS with pertinent positives and negatives documented in the history of present illness/assessment and plan. ? ?Exam: ? ?Vitals:  ? 04/17/22 1413  ?BP: 116/74  ?Temp: 99 ?F (37.2 ?C)  ?TempSrc: Oral  ? ?General appearance:  Normal ? ?Abdomen: Soft, non distended, not currently tender.  No evidence of hernia.   ? ?Gynecologic exam: Vulva normal.  Speculum:  Vagina normal.  Mild white discharge.  Wet prep done.  Bimanual exam:  No pelvic mass felt, NT. ? ?U/A:  Negative except RBC 3-7. ?Wet prep Neg. ? ? ?Assessment/Plan:  56 y.o. G3P0020  ? ?1. Intermittent lower abdominal pain ?S/P Robotic TLH/BS in 11/2021. Intermittent mid abdomen and pelvic discomfort not associated with movement.  Tendency for constipation.  No fever.  Normal abdominopelvic exam.  No evidence of abdominal hernia or pelvic mass.  Probably associated with constipation tendency.  Diet modifications suggested.  F/U pelvic US for further investigation. ?- US Transvaginal Non-OB; Future ? ?2. Pain with urination ?U/A Negative except 3-7 RBC.  Pending U. Culture.  Reassured. ?- Urinalysis,Complete w/RFL Culture ? ?3. Vagina itching ?No evidence of yeast vaginitis on Wet prep.  Will try Boric  Acid as needed to restore her vaginal Flora. ?- Wet prep ? ?Other orders ?- fexofenadine (ALLEGRA) 180 MG tablet; Take 180 mg by mouth daily.  ? ?[name] MD, 2:37 PM 04/17/2022 ? ? ? ?  ?

## 2022-04-19 LAB — URINALYSIS, COMPLETE W/RFL CULTURE
Bacteria, UA: NONE SEEN /HPF
Bilirubin Urine: NEGATIVE
Casts: NONE SEEN /LPF
Crystals: NONE SEEN /HPF
Glucose, UA: NEGATIVE
Hyaline Cast: NONE SEEN /LPF
Ketones, ur: NEGATIVE
Leukocyte Esterase: NEGATIVE
Nitrites, Initial: NEGATIVE
Protein, ur: NEGATIVE
Specific Gravity, Urine: 1.01 (ref 1.001–1.035)
WBC, UA: NONE SEEN /HPF (ref 0–5)
Yeast: NONE SEEN /HPF
pH: 6 (ref 5.0–8.0)

## 2022-04-19 LAB — URINE CULTURE
MICRO NUMBER:: 13284416
SPECIMEN QUALITY:: ADEQUATE

## 2022-04-19 LAB — CULTURE INDICATED

## 2022-05-07 ENCOUNTER — Other Ambulatory Visit: Payer: BC Managed Care – PPO | Admitting: Obstetrics & Gynecology

## 2022-05-07 ENCOUNTER — Other Ambulatory Visit: Payer: BC Managed Care – PPO

## 2022-08-21 DIAGNOSIS — E1165 Type 2 diabetes mellitus with hyperglycemia: Secondary | ICD-10-CM | POA: Diagnosis not present

## 2022-09-06 ENCOUNTER — Encounter: Payer: Self-pay | Admitting: Obstetrics & Gynecology

## 2022-09-06 ENCOUNTER — Ambulatory Visit (INDEPENDENT_AMBULATORY_CARE_PROVIDER_SITE_OTHER): Payer: BC Managed Care – PPO | Admitting: Obstetrics & Gynecology

## 2022-09-06 VITALS — BP 120/80 | HR 72 | Resp 16 | Ht 64.75 in | Wt 179.0 lb

## 2022-09-06 DIAGNOSIS — Z90710 Acquired absence of both cervix and uterus: Secondary | ICD-10-CM

## 2022-09-06 DIAGNOSIS — Z01419 Encounter for gynecological examination (general) (routine) without abnormal findings: Secondary | ICD-10-CM

## 2022-09-06 NOTE — Progress Notes (Signed)
Sheri Murphy 1966-08-22 [id number]   History:    55 y.o. G3P0A3 Single   RP:  Established patient presenting for annual gyn exam   HPI: XI Robotic TLH/BS on 12/05/2021. Patho benign.  No indication for a Pap test at this time. No pelvic pain.  Abstinent currently.  No abnormal vaginal discharge.  Urine and bowel movements normal.  Breasts normal.  Mammo Neg in 03/2022.  Body mass index 30.02.  More physically active.  Health labs with family physician.  Mother with colon cancer.  Patient had a screening colonoscopy in 2021, benign Polyp.  Will continue colonoscopy every 5 years.     Past medical history,surgical history, family history and social history were all reviewed and documented in the EPIC chart.  Gynecologic History No LMP recorded (lmp unknown). Patient has had a hysterectomy.  Obstetric History OB History  Gravida Para Term Preterm AB Living  3 0     3 0  SAB IAB Ectopic Multiple Live Births  1 2          # Outcome Date GA Lbr Len/2nd Weight Sex Delivery Anes PTL Lv  3 IAB           2 IAB           1 SAB              ROS: A ROS was performed and pertinent positives and negatives are included in the history.  GENERAL: No fevers or chills. HEENT: No change in vision, no earache, sore throat or sinus congestion. NECK: No pain or stiffness. CARDIOVASCULAR: No chest pain or pressure. No palpitations. PULMONARY: No shortness of breath, cough or wheeze. GASTROINTESTINAL: No abdominal pain, nausea, vomiting or diarrhea, melena or bright red blood per rectum. GENITOURINARY: No urinary frequency, urgency, hesitancy or dysuria. MUSCULOSKELETAL: No joint or muscle pain, no back pain, no recent trauma. DERMATOLOGIC: No rash, no itching, no lesions. ENDOCRINE: No polyuria, polydipsia, no heat or cold intolerance. No recent change in weight. HEMATOLOGICAL: No anemia or easy bruising or bleeding. NEUROLOGIC: No headache, seizures, numbness, tingling or weakness. PSYCHIATRIC: No depression,  no loss of interest in normal activity or change in sleep pattern.     Exam:   BP 120/80   Pulse 72   Resp 16   Ht 5' 4.75" (1.645 m)   Wt 179 lb (81.2 kg)   LMP  (LMP Unknown) Comment: no menustral cycle since 2018 uterine ablation  BMI 30.02 kg/m   Body mass index is 30.02 kg/m.  General appearance : Well developed well nourished female. No acute distress HEENT: Eyes: no retinal hemorrhage or exudates,  Neck supple, trachea midline, no carotid bruits, no thyroidmegaly Lungs: Clear to auscultation, no rhonchi or wheezes, or rib retractions  Heart: Regular rate and rhythm, no murmurs or gallops Breast:Examined in sitting and supine position were symmetrical in appearance, no palpable masses or tenderness,  no skin retraction, no nipple inversion, no nipple discharge, no skin discoloration, no axillary or supraclavicular lymphadenopathy Abdomen: no palpable masses or tenderness, no rebound or guarding Extremities: no edema or skin discoloration or tenderness  Pelvic: Vulva: Normal             Vagina: No gross lesions or discharge  Cervix/Uterus absent  Adnexa  Without masses or tenderness  Anus: Normal   Assessment/Plan:  56 y.o. female for annual exam   1. Well female exam with routine gynecological exam XI Robotic TLH/BS on 12/05/2021. Patho benign.  No  indication for a Pap test at this time. No pelvic pain.  Abstinent currently.  No abnormal vaginal discharge.  Urine and bowel movements normal.  Breasts normal.  Mammo Neg in 03/2022.  Body mass index 30.02.  More physically active.  Health labs with family physician.  Mother with colon cancer.  Patient had a screening colonoscopy in 2021, benign Polyp.  Will continue colonoscopy every 5 years.    2. S/P total hysterectomy  Other orders - losartan (COZAAR) 25 MG tablet; Take 25 mg by mouth daily. - rosuvastatin (CRESTOR) 5 MG tablet; Take 5 mg by mouth daily. - OZEMPIC, 0.25 OR 0.5 MG/DOSE, 2 MG/3ML SOPN; Inject into the  skin.   [name] MD, 9:47 AM 09/06/2022

## 2022-10-21 ENCOUNTER — Encounter: Payer: Self-pay | Admitting: *Deleted

## 2022-10-30 ENCOUNTER — Other Ambulatory Visit (HOSPITAL_COMMUNITY): Payer: Self-pay | Admitting: Urology

## 2022-10-30 ENCOUNTER — Ambulatory Visit (HOSPITAL_COMMUNITY)
Admission: RE | Admit: 2022-10-30 | Discharge: 2022-10-30 | Disposition: A | Discharge status: Home / Self Care | Payer: BC Managed Care – PPO | Source: Ambulatory Visit | Attending: Urology | Admitting: Urology

## 2022-10-30 DIAGNOSIS — Z85528 Personal history of other malignant neoplasm of kidney: Secondary | ICD-10-CM | POA: Insufficient documentation

## 2022-10-30 DIAGNOSIS — R079 Chest pain, unspecified: Secondary | ICD-10-CM | POA: Diagnosis not present

## 2022-10-30 DIAGNOSIS — R3121 Asymptomatic microscopic hematuria: Secondary | ICD-10-CM | POA: Diagnosis not present

## 2022-11-07 DIAGNOSIS — K3189 Other diseases of stomach and duodenum: Secondary | ICD-10-CM | POA: Diagnosis not present

## 2022-11-07 DIAGNOSIS — C642 Malignant neoplasm of left kidney, except renal pelvis: Secondary | ICD-10-CM | POA: Diagnosis not present

## 2022-11-07 DIAGNOSIS — K449 Diaphragmatic hernia without obstruction or gangrene: Secondary | ICD-10-CM | POA: Diagnosis not present

## 2022-11-07 DIAGNOSIS — K760 Fatty (change of) liver, not elsewhere classified: Secondary | ICD-10-CM | POA: Diagnosis not present

## 2022-11-07 DIAGNOSIS — Z85528 Personal history of other malignant neoplasm of kidney: Secondary | ICD-10-CM | POA: Diagnosis not present

## 2022-11-14 DIAGNOSIS — R3121 Asymptomatic microscopic hematuria: Secondary | ICD-10-CM | POA: Diagnosis not present

## 2022-11-18 DIAGNOSIS — R0789 Other chest pain: Secondary | ICD-10-CM | POA: Diagnosis not present

## 2022-11-18 DIAGNOSIS — R000 Tachycardia, unspecified: Secondary | ICD-10-CM | POA: Diagnosis not present

## 2022-11-18 DIAGNOSIS — R002 Palpitations: Secondary | ICD-10-CM | POA: Diagnosis not present

## 2022-11-19 DIAGNOSIS — R000 Tachycardia, unspecified: Secondary | ICD-10-CM | POA: Diagnosis not present

## 2022-11-19 DIAGNOSIS — R002 Palpitations: Secondary | ICD-10-CM | POA: Diagnosis not present

## 2022-11-26 DIAGNOSIS — Z978 Presence of other specified devices: Secondary | ICD-10-CM | POA: Diagnosis not present

## 2022-11-26 DIAGNOSIS — E11319 Type 2 diabetes mellitus with unspecified diabetic retinopathy without macular edema: Secondary | ICD-10-CM | POA: Diagnosis not present

## 2022-11-26 DIAGNOSIS — E1165 Type 2 diabetes mellitus with hyperglycemia: Secondary | ICD-10-CM | POA: Diagnosis not present

## 2022-11-26 DIAGNOSIS — Z79899 Other long term (current) drug therapy: Secondary | ICD-10-CM | POA: Diagnosis not present

## 2022-11-26 DIAGNOSIS — E785 Hyperlipidemia, unspecified: Secondary | ICD-10-CM | POA: Diagnosis not present

## 2022-11-26 DIAGNOSIS — E1140 Type 2 diabetes mellitus with diabetic neuropathy, unspecified: Secondary | ICD-10-CM | POA: Diagnosis not present

## 2022-11-26 DIAGNOSIS — Z7985 Long-term (current) use of injectable non-insulin antidiabetic drugs: Secondary | ICD-10-CM | POA: Diagnosis not present

## 2022-11-26 DIAGNOSIS — Z7984 Long term (current) use of oral hypoglycemic drugs: Secondary | ICD-10-CM | POA: Diagnosis not present

## 2022-11-26 DIAGNOSIS — I10 Essential (primary) hypertension: Secondary | ICD-10-CM | POA: Diagnosis not present

## 2022-11-26 DIAGNOSIS — E1121 Type 2 diabetes mellitus with diabetic nephropathy: Secondary | ICD-10-CM | POA: Diagnosis not present

## 2022-11-29 DIAGNOSIS — G4733 Obstructive sleep apnea (adult) (pediatric): Secondary | ICD-10-CM | POA: Diagnosis not present

## 2022-11-29 DIAGNOSIS — R059 Cough, unspecified: Secondary | ICD-10-CM | POA: Diagnosis not present

## 2022-11-29 DIAGNOSIS — J309 Allergic rhinitis, unspecified: Secondary | ICD-10-CM | POA: Diagnosis not present

## 2022-11-29 DIAGNOSIS — R0982 Postnasal drip: Secondary | ICD-10-CM | POA: Diagnosis not present

## 2022-12-02 DIAGNOSIS — K581 Irritable bowel syndrome with constipation: Secondary | ICD-10-CM | POA: Diagnosis not present

## 2022-12-12 DIAGNOSIS — G4733 Obstructive sleep apnea (adult) (pediatric): Secondary | ICD-10-CM | POA: Diagnosis not present

## 2022-12-15 DIAGNOSIS — I493 Ventricular premature depolarization: Secondary | ICD-10-CM | POA: Diagnosis not present

## 2022-12-15 DIAGNOSIS — I491 Atrial premature depolarization: Secondary | ICD-10-CM | POA: Diagnosis not present

## 2022-12-31 DIAGNOSIS — K581 Irritable bowel syndrome with constipation: Secondary | ICD-10-CM | POA: Diagnosis not present

## 2023-02-04 DIAGNOSIS — J309 Allergic rhinitis, unspecified: Secondary | ICD-10-CM | POA: Diagnosis not present

## 2023-02-04 DIAGNOSIS — R0982 Postnasal drip: Secondary | ICD-10-CM | POA: Diagnosis not present

## 2023-02-04 DIAGNOSIS — R059 Cough, unspecified: Secondary | ICD-10-CM | POA: Diagnosis not present

## 2023-02-04 DIAGNOSIS — G4733 Obstructive sleep apnea (adult) (pediatric): Secondary | ICD-10-CM | POA: Diagnosis not present

## 2023-02-21 DIAGNOSIS — G4733 Obstructive sleep apnea (adult) (pediatric): Secondary | ICD-10-CM | POA: Diagnosis not present

## 2023-03-22 DIAGNOSIS — G4733 Obstructive sleep apnea (adult) (pediatric): Secondary | ICD-10-CM | POA: Diagnosis not present

## 2023-04-22 DIAGNOSIS — G4733 Obstructive sleep apnea (adult) (pediatric): Secondary | ICD-10-CM | POA: Diagnosis not present

## 2023-05-01 DIAGNOSIS — J309 Allergic rhinitis, unspecified: Secondary | ICD-10-CM | POA: Diagnosis not present

## 2023-05-01 DIAGNOSIS — G4733 Obstructive sleep apnea (adult) (pediatric): Secondary | ICD-10-CM | POA: Diagnosis not present

## 2023-05-01 DIAGNOSIS — R0982 Postnasal drip: Secondary | ICD-10-CM | POA: Diagnosis not present

## 2023-05-02 DIAGNOSIS — E1165 Type 2 diabetes mellitus with hyperglycemia: Secondary | ICD-10-CM | POA: Diagnosis not present

## 2023-05-02 DIAGNOSIS — E785 Hyperlipidemia, unspecified: Secondary | ICD-10-CM | POA: Diagnosis not present

## 2023-05-02 DIAGNOSIS — I10 Essential (primary) hypertension: Secondary | ICD-10-CM | POA: Diagnosis not present

## 2023-05-22 DIAGNOSIS — G4733 Obstructive sleep apnea (adult) (pediatric): Secondary | ICD-10-CM | POA: Diagnosis not present

## 2023-06-22 DIAGNOSIS — G4733 Obstructive sleep apnea (adult) (pediatric): Secondary | ICD-10-CM | POA: Diagnosis not present

## 2023-07-22 DIAGNOSIS — G4733 Obstructive sleep apnea (adult) (pediatric): Secondary | ICD-10-CM | POA: Diagnosis not present

## 2023-07-28 DIAGNOSIS — K581 Irritable bowel syndrome with constipation: Secondary | ICD-10-CM | POA: Diagnosis not present

## 2023-08-19 DIAGNOSIS — E1165 Type 2 diabetes mellitus with hyperglycemia: Secondary | ICD-10-CM | POA: Diagnosis not present

## 2023-08-19 DIAGNOSIS — E785 Hyperlipidemia, unspecified: Secondary | ICD-10-CM | POA: Diagnosis not present

## 2023-08-19 DIAGNOSIS — Z7984 Long term (current) use of oral hypoglycemic drugs: Secondary | ICD-10-CM | POA: Diagnosis not present

## 2023-08-19 DIAGNOSIS — I10 Essential (primary) hypertension: Secondary | ICD-10-CM | POA: Diagnosis not present

## 2023-09-08 LAB — HM DIABETES EYE EXAM

## 2023-09-12 DIAGNOSIS — J069 Acute upper respiratory infection, unspecified: Secondary | ICD-10-CM | POA: Diagnosis not present

## 2023-09-12 DIAGNOSIS — M5416 Radiculopathy, lumbar region: Secondary | ICD-10-CM | POA: Diagnosis not present

## 2023-09-12 DIAGNOSIS — M791 Myalgia, unspecified site: Secondary | ICD-10-CM | POA: Diagnosis not present

## 2023-09-12 DIAGNOSIS — M2550 Pain in unspecified joint: Secondary | ICD-10-CM | POA: Diagnosis not present

## 2023-09-12 DIAGNOSIS — M546 Pain in thoracic spine: Secondary | ICD-10-CM | POA: Diagnosis not present

## 2023-09-15 DIAGNOSIS — J208 Acute bronchitis due to other specified organisms: Secondary | ICD-10-CM | POA: Diagnosis not present

## 2023-09-15 DIAGNOSIS — B9689 Other specified bacterial agents as the cause of diseases classified elsewhere: Secondary | ICD-10-CM | POA: Diagnosis not present

## 2023-09-15 DIAGNOSIS — J019 Acute sinusitis, unspecified: Secondary | ICD-10-CM | POA: Diagnosis not present

## 2023-09-21 DIAGNOSIS — R058 Other specified cough: Secondary | ICD-10-CM | POA: Diagnosis not present

## 2023-09-21 DIAGNOSIS — H66002 Acute suppurative otitis media without spontaneous rupture of ear drum, left ear: Secondary | ICD-10-CM | POA: Diagnosis not present

## 2023-09-21 DIAGNOSIS — J40 Bronchitis, not specified as acute or chronic: Secondary | ICD-10-CM | POA: Diagnosis not present

## 2023-09-21 DIAGNOSIS — R0602 Shortness of breath: Secondary | ICD-10-CM | POA: Diagnosis not present

## 2023-09-21 DIAGNOSIS — Z20822 Contact with and (suspected) exposure to covid-19: Secondary | ICD-10-CM | POA: Diagnosis not present

## 2023-10-09 ENCOUNTER — Encounter: Payer: Self-pay | Admitting: Obstetrics and Gynecology

## 2023-10-09 ENCOUNTER — Ambulatory Visit (INDEPENDENT_AMBULATORY_CARE_PROVIDER_SITE_OTHER): Payer: BC Managed Care – PPO | Admitting: Obstetrics and Gynecology

## 2023-10-09 VITALS — BP 118/74 | HR 74 | Ht 64.75 in | Wt 181.0 lb

## 2023-10-09 DIAGNOSIS — E0800 Diabetes mellitus due to underlying condition with hyperosmolarity without nonketotic hyperglycemic-hyperosmolar coma (NKHHC): Secondary | ICD-10-CM

## 2023-10-09 DIAGNOSIS — E2839 Other primary ovarian failure: Secondary | ICD-10-CM

## 2023-10-09 DIAGNOSIS — Z79890 Hormone replacement therapy (postmenopausal): Secondary | ICD-10-CM

## 2023-10-09 DIAGNOSIS — Z01419 Encounter for gynecological examination (general) (routine) without abnormal findings: Secondary | ICD-10-CM

## 2023-10-09 DIAGNOSIS — N951 Menopausal and female climacteric states: Secondary | ICD-10-CM

## 2023-10-09 DIAGNOSIS — N393 Stress incontinence (female) (male): Secondary | ICD-10-CM

## 2023-10-09 MED ORDER — ESTRADIOL 0.0375 MG/24HR TD PTWK: 0.0375 mg | MEDICATED_PATCH | TRANSDERMAL | 12 refills | Status: DC

## 2023-10-09 NOTE — Addendum Note (Signed)
Addended by: Earley Favor on: 10/09/2023 03:24 PM   Modules accepted: Orders

## 2023-10-09 NOTE — Addendum Note (Signed)
Addended by: Earley Favor on: 10/09/2023 02:29 PM   Modules accepted: Orders

## 2023-10-09 NOTE — Progress Notes (Addendum)
History:    57 y.o. G3P0A3 Single   RP:  Established patient presenting for annual gyn exam   HPI: XI Robotic TLH/BS on 12/05/2021. Patho benign.  No indication for a Pap test at this time. No pelvic pain.  Abstinent currently.  No abnormal vaginal discharge.  Urine and bowel movements normal.  Breasts normal.  Mammo Neg in 03/2022.  Body mass index 30.02.  More physically active.  Health labs with family physician.  Mother with colon cancer.  Patient had a screening colonoscopy in 2021, benign Polyp.  Will continue colonoscopy every 5 years.   Dexa: to get baseline with risk factor of DM and IBS and hypoestrogen  Reports bothersome SUI, vasomotor symptoms that are bothersome, fatigue, joint pain. She is not sexually active  Blood pressure 118/74, pulse 74, height 5' 4.75" (1.645 m), weight 181 lb (82.1 kg), SpO2 99%.     Component Value Date/Time   DIAGPAP  07/31/2021 1633    - Negative for intraepithelial lesion or malignancy (NILM)   ADEQPAP  07/31/2021 1633    Satisfactory for evaluation; transformation zone component PRESENT.    GYN HISTORY:    Component Value Date/Time   DIAGPAP  07/31/2021 1633    - Negative for intraepithelial lesion or malignancy (NILM)   ADEQPAP  07/31/2021 1633    Satisfactory for evaluation; transformation zone component PRESENT.    OB History  Gravida Para Term Preterm AB Living  3 0     3 0  SAB IAB Ectopic Multiple Live Births  1 2          # Outcome Date GA Lbr Len/2nd Weight Sex Type Anes PTL Lv  3 IAB           2 IAB           1 SAB             Past Medical History:  Diagnosis Date   Abnormal liver function 01/08/2016   Anemia    Anxiety    Constipation 11/28/2021   Controlled type 2 diabetes mellitus without complication (HCC)    Fatty liver    hx of elevated liver enzymes in past   Fibroids    Fibromyalgia    GERD (gastroesophageal reflux disease)    Heart murmur    mild no cardiologist   Hematuria    History of COVID-19  04/2021   fever cough and cold symptoms, took oral antivirals, all symptoms resolved   Hypertension    IBS (irritable bowel syndrome)    Insomnia    Menorrhagia    Obesity    Pelvic pain 11/28/2021   recurrent   Renal cell carcinoma, left (HCC) 2018   surgery only chemo/radiation not required, small spot removed   Urinary incontinence 11/28/2021   weras small pad   Urinary urgency    Vitamin D deficiency    Wears glasses     Past Surgical History:  Procedure Laterality Date   BREAST BIOPSY Right 2021   benign   COLONOSCOPY  2021   DILATATION & CURETTAGE/HYSTEROSCOPY WITH MYOSURE N/A 05/13/2018   Procedure: DILATATION & CURETTAGE/HYSTEROSCOPY WITH NOVASURE;  Surgeon: Genia Del, MD;  Location: [facility];  Service: Gynecology;  Laterality: N/A;   Left renal mass removal  07/2017   Thermal Microwave Ablation   ROBOTIC ASSISTED TOTAL HYSTERECTOMY Bilateral 12/05/2021   Procedure: XI ROBOTIC ASSISTED TOTAL HYSTERECTOMY WITH BILATERAL SALPINGECTOMY;  Surgeon: Genia Del, MD;  Location: [facility];  Service: Gynecology;  Laterality: Bilateral;   SKIN LESION EXCISION  2012   basal cell removed no problems since, sees dermatology prn now   uterine ablation  2018   WISDOM TOOTH EXTRACTION     yrs ago per pt on 11-28-2021    Current Outpatient Medications on File Prior to Visit  Medication Sig Dispense Refill   acetaminophen (TYLENOL) 500 MG tablet Take 500 mg by mouth every 6 (six) hours as needed for mild pain.     bismuth subsalicylate (PEPTO BISMOL) 262 MG/15ML suspension Take 30 mLs by mouth every 6 (six) hours as needed.     bisoprolol-hydrochlorothiazide (ZIAC) 10-6.25 MG tablet Take 1 tablet by mouth daily.     Continuous Glucose Sensor (FREESTYLE LIBRE 3 SENSOR) MISC PLACE A SENSOR EVERY 14 DAYS.     fexofenadine (ALLEGRA) 180 MG tablet Take 180 mg by mouth daily.     glipiZIDE (GLUCOTROL) 5 MG tablet Take 5 mg by mouth 2 (two)  times daily.     ibuprofen (ADVIL) 600 MG tablet TAKE 1 TABLET(600 MG) BY MOUTH EVERY 8 HOURS AS NEEDED 60 tablet 3   losartan (COZAAR) 25 MG tablet Take 25 mg by mouth daily.     metFORMIN (GLUCOPHAGE-XR) 500 MG 24 hr tablet Take 500 mg by mouth 2 (two) times daily.     omeprazole (PRILOSEC) 40 MG capsule Take 40 mg by mouth daily.     rosuvastatin (CRESTOR) 5 MG tablet Take 5 mg by mouth daily.     triamcinolone cream (KENALOG) 0.1 % SMARTSIG:Topical 1-3 Times Daily     No current facility-administered medications on file prior to visit.    Social History   Socioeconomic History   Marital status: Single    Spouse name: Not on file   Number of children: Not on file   Years of education: Not on file   Highest education level: Not on file  Occupational History   Not on file  Tobacco Use   Smoking status: Never   Smokeless tobacco: Never  Vaping Use   Vaping status: Never Used  Substance and Sexual Activity   Alcohol use: Yes    Comment: periodically   Drug use: No   Sexual activity: Not Currently    Partners: Male    Birth control/protection: Surgical    Comment: 1st intercourse- 18, hysterectomy  Other Topics Concern   Not on file  Social History Narrative   Not on file   Social Determinants of Health   Financial Resource Strain: Not on file  Food Insecurity: Low Risk  (09/12/2023)   Received from [facility]   Hunger Vital Sign    Worried About Running Out of Food in the Last Year: Never true    Ran Out of Food in the Last Year: Never true  Transportation Needs: No Transportation Needs (09/12/2023)   Received from [facility]    In the past 12 months, has lack of reliable transportation kept you from medical appointments, meetings, work or from getting things needed for daily living? : No  Physical Activity: Not on file  Stress: Not on file  Social Connections: Unknown (05/11/2022)   Received from [facility], [facility]   Social Network     Social Network: Not on file  Intimate Partner Violence: Unknown (04/02/2022)   Received from [facility], [facility]   HITS    Physically Hurt: Not on file    Insult or Talk Down  To: Not on file    Threaten Physical Harm: Not on file    Scream or Curse: Not on file    Family History  Problem Relation Age of Onset   Cancer Mother 64       COLON   Hypertension Mother    Hypertension Father    Hypertension Maternal Aunt    Cancer Maternal Grandmother        small intestine    Hypertension Maternal Grandmother    Cancer Maternal Grandfather        pancreatic   Diabetes Paternal Grandmother      Allergies  Allergen Reactions   Amitriptyline Other (See Comments)    nightmares   Chlordiazepoxide Other (See Comments)    nightmares   Macrodantin [Nitrofurantoin] Hives      Patient's last menstrual period was No LMP recorded (lmp unknown). Patient has had a hysterectomy..            Review of Systems Alls systems reviewed and are negative.     Physical Exam Constitutional:      Appearance: Normal appearance.  Genitourinary:     Vulva and urethral meatus normal.     No lesions in the vagina.     Right Labia: No rash, lesions or skin changes.    Left Labia: No lesions, skin changes or rash.    Vaginal discharge (yeast. recent antibiotic) present.     No vaginal tenderness.     No vaginal prolapse present.    No vaginal atrophy present.     Right Adnexa: not tender, not palpable and no mass present.    Left Adnexa: not tender, not palpable and no mass present.    No cervical motion tenderness or discharge.     Uterus is not enlarged, tender or irregular.     Uterus is anteverted.  Breasts:    Right: Normal.     Left: Normal.  HENT:     Head: Normocephalic.  Neck:     Thyroid: No thyroid mass, thyromegaly or thyroid tenderness.  Cardiovascular:     Rate and Rhythm: Normal rate and regular rhythm.     Heart sounds: Normal heart sounds, S1 normal and S2  normal.  Pulmonary:     Effort: Pulmonary effort is normal.     Breath sounds: Normal breath sounds and air entry.  Abdominal:     General: There is no distension.     Palpations: Abdomen is soft. There is no mass.     Tenderness: There is no abdominal tenderness. There is no guarding or rebound.  Musculoskeletal:        General: Normal range of motion.     Cervical back: Full passive range of motion without pain, normal range of motion and neck supple. No tenderness.     Right lower leg: No edema.     Left lower leg: No edema.  Neurological:     Mental Status: She is alert.  Skin:    General: Skin is warm.  Psychiatric:        Mood and Affect: Mood normal.        Behavior: Behavior normal.        Thought Content: Thought content normal.  Vitals and nursing note reviewed. Exam conducted with a chaperone present.       A:         Well Woman GYN exam, SUI, hot flashes  P:        Pap smear not indicated Encouraged annual mammogram screening Colon cancer screening up-to-date DXA ordered today Labs and immunizations ordered today Discussed breast self exams Encouraged healthy lifestyle practices Encouraged Vit D and Calcium  Referral to urogyn for SUI evaluation Counseled on the r/b/a/I of HRT use. To get a hormone panel.  Discussed that she will need progesterone to avoid unopposed estrogen on the endometrial lining and risk for endometrial cancer. Discussed lower risk for DVT and stroke with the patch. Side effects include risk of breast tenderness and spotting along with low risk of blood clots and stroke with uncontrolled hypertension. Counseled on the benefits to help improve the bone density during menopause. She will watch her bp's at home while taking and agrees to discontinue if high. 30 minutes spent on reviewing records, imaging,  and one on one patient time and counseling patient and documentation Dr. Karma Greaser  No follow-ups on  file.  Earley Favor

## 2023-10-09 NOTE — Addendum Note (Signed)
Addended by: Earley Favor on: 10/09/2023 03:19 PM   Modules accepted: Level of Service

## 2023-10-10 LAB — SURESWAB® ADVANCED VAGINITIS PLUS,TMA
C. trachomatis RNA, TMA: NOT DETECTED
CANDIDA SPECIES: NOT DETECTED
Candida glabrata: NOT DETECTED
N. gonorrhoeae RNA, TMA: NOT DETECTED
SURESWAB(R) ADV BACTERIAL VAGINOSIS(BV),TMA: NEGATIVE
TRICHOMONAS VAGINALIS (TV),TMA: NOT DETECTED

## 2023-10-20 LAB — COMPREHENSIVE METABOLIC PANEL
AG Ratio: 1.4 (calc) (ref 1.0–2.5)
ALT: 23 U/L (ref 6–29)
AST: 22 U/L (ref 10–35)
Albumin: 4.4 g/dL (ref 3.6–5.1)
Alkaline phosphatase (APISO): 52 U/L (ref 37–153)
BUN: 15 mg/dL (ref 7–25)
CO2: 25 mmol/L (ref 20–32)
Calcium: 10.2 mg/dL (ref 8.6–10.4)
Chloride: 101 mmol/L (ref 98–110)
Creat: 0.84 mg/dL (ref 0.50–1.03)
Globulin: 3.2 g/dL (ref 1.9–3.7)
Glucose, Bld: 73 mg/dL (ref 65–99)
Potassium: 4.2 mmol/L (ref 3.5–5.3)
Sodium: 139 mmol/L (ref 135–146)
Total Bilirubin: 0.3 mg/dL (ref 0.2–1.2)
Total Protein: 7.6 g/dL (ref 6.1–8.1)

## 2023-10-20 LAB — VITAMIN D 1,25 DIHYDROXY
Vitamin D 1, 25 (OH)2 Total: 36 pg/mL (ref 18–72)
Vitamin D2 1, 25 (OH)2: <8 pg/mL
Vitamin D3 1, 25 (OH)2: 36 pg/mL

## 2023-10-20 LAB — CBC
HCT: 39 % (ref 35.0–45.0)
Hemoglobin: 12.6 g/dL (ref 11.7–15.5)
MCH: 25.4 pg — ABNORMAL LOW (ref 27.0–33.0)
MCHC: 32.3 g/dL (ref 32.0–36.0)
MCV: 78.5 fL — ABNORMAL LOW (ref 80.0–100.0)
MPV: 10.5 fL (ref 7.5–12.5)
Platelets: 346 10*3/uL (ref 140–400)
RBC: 4.97 10*6/uL (ref 3.80–5.10)
RDW: 14.4 % (ref 11.0–15.0)
WBC: 8 10*3/uL (ref 3.8–10.8)

## 2023-10-20 LAB — FOLLICLE STIMULATING HORMONE: FSH: 31.7 m[IU]/mL

## 2023-10-20 LAB — LIPID PANEL
Cholesterol: 126 mg/dL (ref <200)
HDL: 38 mg/dL — ABNORMAL LOW (ref >=50)
LDL Cholesterol (Calc): 61 mg/dL
Non-HDL Cholesterol (Calc): 88 mg/dL (ref <130)
Total CHOL/HDL Ratio: 3.3 (calc) (ref <5.0)
Triglycerides: 194 mg/dL — ABNORMAL HIGH (ref <150)

## 2023-10-20 LAB — TESTOS,TOTAL,FREE AND SHBG (FEMALE)
Free Testosterone: 2.9 pg/mL (ref 0.1–6.4)
Testosterone, Total, LC-MS-MS: 17 ng/dL (ref 2–45)

## 2023-10-20 LAB — HEMOGLOBIN A1C
Hgb A1c MFr Bld: 7.8 %{Hb} — ABNORMAL HIGH (ref <5.7)
Mean Plasma Glucose: 177 mg/dL
eAG (mmol/L): 9.8 mmol/L

## 2023-10-20 LAB — TSH: TSH: 1.26 m[IU]/L

## 2023-10-20 LAB — ESTRADIOL: Estradiol: 35 pg/mL

## 2023-10-22 ENCOUNTER — Other Ambulatory Visit: Payer: Self-pay | Admitting: Urology

## 2023-10-22 DIAGNOSIS — Z85528 Personal history of other malignant neoplasm of kidney: Secondary | ICD-10-CM

## 2023-10-31 DIAGNOSIS — E1165 Type 2 diabetes mellitus with hyperglycemia: Secondary | ICD-10-CM | POA: Diagnosis not present

## 2023-11-03 ENCOUNTER — Encounter: Payer: Self-pay | Admitting: Nurse Practitioner

## 2023-11-03 ENCOUNTER — Ambulatory Visit (INDEPENDENT_AMBULATORY_CARE_PROVIDER_SITE_OTHER): Payer: BC Managed Care – PPO | Admitting: Nurse Practitioner

## 2023-11-03 VITALS — BP 132/80 | HR 84 | Wt 181.0 lb

## 2023-11-03 DIAGNOSIS — Z113 Encounter for screening for infections with a predominantly sexual mode of transmission: Secondary | ICD-10-CM | POA: Diagnosis not present

## 2023-11-03 DIAGNOSIS — N898 Other specified noninflammatory disorders of vagina: Secondary | ICD-10-CM | POA: Diagnosis not present

## 2023-11-03 DIAGNOSIS — N941 Unspecified dyspareunia: Secondary | ICD-10-CM | POA: Diagnosis not present

## 2023-11-03 LAB — WET PREP FOR TRICH, YEAST, CLUE

## 2023-11-03 NOTE — Progress Notes (Signed)
   Acute Office Visit  Subjective:    Patient ID: Sheri Murphy, female    DOB: 1966/06/01, 57 y.o.   MRN: [id number]   HPI 57 y.o. G3P0030 presents today for clear, watery vaginal discharge with irritation. New sexual partner, used new lubricant. Also complains of painful intercourse. Started on estradiol patches a few weeks ago.   No LMP recorded (lmp unknown). Patient has had a hysterectomy.    Review of Systems  Constitutional: Negative.   Genitourinary:  Positive for dyspareunia, vaginal discharge and vaginal pain (Irritation).       Objective:    Physical Exam Constitutional:      Appearance: Normal appearance.  Genitourinary:    General: Normal vulva.     Vagina: Vaginal discharge present. No erythema.     BP 132/80   Pulse 84   Wt 181 lb (82.1 kg)   LMP  (LMP Unknown) Comment: no menustral cycle since 2018 uterine ablation  SpO2 98%   BMI 30.35 kg/m  Wt Readings from Last 3 Encounters:  11/03/23 181 lb (82.1 kg)  10/09/23 181 lb (82.1 kg)  09/06/22 179 lb (81.2 kg)        Patient informed chaperone available to be present for breast and/or pelvic exam. Patient has requested no chaperone to be present. Patient has been advised what will be completed during breast and pelvic exam.   Wet prep negative for pathogens, many WBCs, moderate bacteria  Assessment & Plan:   Problem List Items Addressed This Visit   None Visit Diagnoses     Vaginal discharge    -  Primary   Relevant Orders   C. trachomatis/N. gonorrhoeae RNA   WET PREP FOR TRICH, YEAST, CLUE   Screening examination for STD (sexually transmitted disease)       Relevant Orders   C. trachomatis/N. gonorrhoeae RNA   WET PREP FOR TRICH, YEAST, CLUE   RPR   HIV Antibody (routine testing w rflx)   Hepatitis C antibody   Dyspareunia in female          Plan: Negative wet prep. STD panel pending. Discussed menopausal changes and painful intercourse. Will consider vaginal estrogen if no improvement  with patches. Recommend oil or silicone lubricant. [employer] samples provided.   Return if symptoms worsen or fail to improve.    Olivia Mackie DNP, 2:47 PM 11/03/2023

## 2023-11-04 LAB — HEPATITIS C ANTIBODY: Hepatitis C Ab: NONREACTIVE

## 2023-11-04 LAB — RPR: RPR Ser Ql: NONREACTIVE

## 2023-11-04 LAB — HIV ANTIBODY (ROUTINE TESTING W REFLEX): HIV 1&2 Ab, 4th Generation: NONREACTIVE

## 2023-11-05 LAB — C. TRACHOMATIS/N. GONORRHOEAE RNA
C. trachomatis RNA, TMA: NOT DETECTED
N. gonorrhoeae RNA, TMA: NOT DETECTED

## 2023-11-22 ENCOUNTER — Encounter: Payer: Self-pay | Admitting: Obstetrics and Gynecology

## 2023-11-24 NOTE — Telephone Encounter (Signed)
Call received from [place] at [facility]. Patient is scheduled for Dx MMG on 11/26, no indication for Dx MMG.   10/09/23 AEX and 11/03/23 OV reviewed.   Call placed to patient. Patient denies any breast concerns. Last breast imaging was 08/14/20 followed by right breast biopsy, BENIGN REACTIVE LYMPH NODE of the Right axilla.    Patient request to cancel appt as she is looking for location in [place] for screening MMG. Reviewed scheduling options for screening MMG, patient will call to schedule and advise if order will be needed. Questions answered. Patient appreciative of call.   TBC notified 11/25/23 appt cancelled.   Routing to provider for final review. Patient is agreeable to disposition. Will close encounter.

## 2023-11-26 ENCOUNTER — Other Ambulatory Visit: Payer: BC Managed Care – PPO

## 2023-12-02 ENCOUNTER — Inpatient Hospital Stay: Admission: RE | Admit: 2023-12-02 | Payer: BC Managed Care – PPO | Source: Ambulatory Visit

## 2023-12-09 DIAGNOSIS — M542 Cervicalgia: Secondary | ICD-10-CM | POA: Diagnosis not present

## 2023-12-09 DIAGNOSIS — G5603 Carpal tunnel syndrome, bilateral upper limbs: Secondary | ICD-10-CM | POA: Diagnosis not present

## 2023-12-10 ENCOUNTER — Inpatient Hospital Stay
Admission: RE | Admit: 2023-12-10 | Discharge: 2023-12-10 | Discharge status: Home / Self Care | Payer: BC Managed Care – PPO | Source: Ambulatory Visit | Attending: Urology

## 2023-12-10 DIAGNOSIS — Z85528 Personal history of other malignant neoplasm of kidney: Secondary | ICD-10-CM | POA: Diagnosis not present

## 2023-12-10 DIAGNOSIS — R93422 Abnormal radiologic findings on diagnostic imaging of left kidney: Secondary | ICD-10-CM | POA: Diagnosis not present

## 2023-12-19 ENCOUNTER — Ambulatory Visit
Admission: RE | Admit: 2023-12-19 | Discharge: 2023-12-19 | Disposition: A | Discharge status: Home / Self Care | Payer: BC Managed Care – PPO | Source: Ambulatory Visit | Attending: Obstetrics and Gynecology | Admitting: Obstetrics and Gynecology

## 2023-12-19 DIAGNOSIS — Z1231 Encounter for screening mammogram for malignant neoplasm of breast: Secondary | ICD-10-CM | POA: Diagnosis not present

## 2023-12-19 DIAGNOSIS — Z01419 Encounter for gynecological examination (general) (routine) without abnormal findings: Secondary | ICD-10-CM

## 2023-12-25 ENCOUNTER — Other Ambulatory Visit: Payer: Self-pay | Admitting: Obstetrics and Gynecology

## 2023-12-25 DIAGNOSIS — R928 Other abnormal and inconclusive findings on diagnostic imaging of breast: Secondary | ICD-10-CM | POA: Insufficient documentation

## 2023-12-26 ENCOUNTER — Encounter: Payer: Self-pay | Admitting: Obstetrics and Gynecology

## 2024-01-02 ENCOUNTER — Other Ambulatory Visit: Payer: BC Managed Care – PPO

## 2024-01-06 DIAGNOSIS — R3121 Asymptomatic microscopic hematuria: Secondary | ICD-10-CM | POA: Diagnosis not present

## 2024-01-13 DIAGNOSIS — C642 Malignant neoplasm of left kidney, except renal pelvis: Secondary | ICD-10-CM | POA: Diagnosis not present

## 2024-01-13 DIAGNOSIS — Z85528 Personal history of other malignant neoplasm of kidney: Secondary | ICD-10-CM | POA: Diagnosis not present

## 2024-01-13 DIAGNOSIS — K760 Fatty (change of) liver, not elsewhere classified: Secondary | ICD-10-CM | POA: Diagnosis not present

## 2024-01-15 ENCOUNTER — Other Ambulatory Visit: Payer: Self-pay | Admitting: Obstetrics and Gynecology

## 2024-01-15 ENCOUNTER — Ambulatory Visit
Admission: RE | Admit: 2024-01-15 | Discharge: 2024-01-15 | Disposition: A | Discharge status: Home / Self Care | Payer: BC Managed Care – PPO | Source: Ambulatory Visit | Attending: Obstetrics and Gynecology | Admitting: Obstetrics and Gynecology

## 2024-01-15 DIAGNOSIS — N6489 Other specified disorders of breast: Secondary | ICD-10-CM

## 2024-01-15 DIAGNOSIS — R928 Other abnormal and inconclusive findings on diagnostic imaging of breast: Secondary | ICD-10-CM

## 2024-01-15 DIAGNOSIS — N6315 Unspecified lump in the right breast, overlapping quadrants: Secondary | ICD-10-CM | POA: Diagnosis not present

## 2024-01-21 ENCOUNTER — Encounter: Payer: Self-pay | Admitting: Obstetrics and Gynecology

## 2024-01-30 DIAGNOSIS — R3121 Asymptomatic microscopic hematuria: Secondary | ICD-10-CM | POA: Diagnosis not present

## 2024-01-30 DIAGNOSIS — Z85528 Personal history of other malignant neoplasm of kidney: Secondary | ICD-10-CM | POA: Diagnosis not present

## 2024-02-03 ENCOUNTER — Other Ambulatory Visit: Payer: Self-pay | Admitting: Medical Genetics

## 2024-02-17 DIAGNOSIS — E1165 Type 2 diabetes mellitus with hyperglycemia: Secondary | ICD-10-CM | POA: Diagnosis not present

## 2024-02-20 ENCOUNTER — Other Ambulatory Visit (HOSPITAL_COMMUNITY): Payer: Self-pay

## 2024-02-20 ENCOUNTER — Other Ambulatory Visit (HOSPITAL_COMMUNITY): Payer: Self-pay | Attending: Medical Genetics

## 2024-03-05 ENCOUNTER — Ambulatory Visit: Payer: BC Managed Care – PPO | Admitting: Family Medicine

## 2024-03-15 ENCOUNTER — Ambulatory Visit (HOSPITAL_BASED_OUTPATIENT_CLINIC_OR_DEPARTMENT_OTHER): Payer: BC Managed Care – PPO | Admitting: Family Medicine

## 2024-03-15 ENCOUNTER — Other Ambulatory Visit (HOSPITAL_BASED_OUTPATIENT_CLINIC_OR_DEPARTMENT_OTHER): Payer: Self-pay

## 2024-03-15 ENCOUNTER — Encounter (HOSPITAL_BASED_OUTPATIENT_CLINIC_OR_DEPARTMENT_OTHER): Payer: Self-pay | Admitting: Family Medicine

## 2024-03-15 ENCOUNTER — Other Ambulatory Visit: Payer: Self-pay

## 2024-03-15 VITALS — BP 136/84 | HR 79 | Ht 64.75 in | Wt 164.4 lb

## 2024-03-15 DIAGNOSIS — E119 Type 2 diabetes mellitus without complications: Secondary | ICD-10-CM | POA: Diagnosis not present

## 2024-03-15 DIAGNOSIS — I10 Essential (primary) hypertension: Secondary | ICD-10-CM | POA: Diagnosis not present

## 2024-03-15 DIAGNOSIS — E7800 Pure hypercholesterolemia, unspecified: Secondary | ICD-10-CM | POA: Diagnosis not present

## 2024-03-15 DIAGNOSIS — Z85528 Personal history of other malignant neoplasm of kidney: Secondary | ICD-10-CM

## 2024-03-15 DIAGNOSIS — R928 Other abnormal and inconclusive findings on diagnostic imaging of breast: Secondary | ICD-10-CM

## 2024-03-15 MED ORDER — DEXCOM G7 SENSOR MISC
12 refills | Status: AC
  Filled 2024-03-15: qty 3, 30d supply, fill #0
  Filled 2024-04-27: qty 3, 30d supply, fill #1
  Filled 2024-05-28: qty 3, 30d supply, fill #2
  Filled 2024-08-05: qty 3, 30d supply, fill #3
  Filled 2024-09-09: qty 3, 30d supply, fill #4
  Filled 2024-10-08: qty 3, 30d supply, fill #5
  Filled 2024-11-12: qty 3, 30d supply, fill #6
  Filled 2024-12-20: qty 3, 30d supply, fill #7
  Filled 2025-01-21: qty 3, 30d supply, fill #8

## 2024-03-15 MED ORDER — BISOPROLOL-HYDROCHLOROTHIAZIDE 10-6.25 MG PO TABS
1.0000 | ORAL_TABLET | Freq: Every day | ORAL | 3 refills | Status: DC
  Filled 2024-03-15 (×3): qty 90, 90d supply, fill #0

## 2024-03-15 NOTE — Assessment & Plan Note (Signed)
 Reports mild OSA per sleep study. Improved per patient with recent weight loss. Not currently using CPAP.

## 2024-03-15 NOTE — Progress Notes (Signed)
 New Patient Office Visit  Subjective:   Sheri Murphy [birth year] 03/15/2024  Chief Complaint  Patient presents with   New Patient (Initial Visit)    Patient is here today to get established with the practice. States she is needing BP meds refilled.    HPI: Sheri Murphy with a history of type 2 diabetes, primary hypertension, hyperlipidemia presents today to establish care at [facility]. Introduced to Publishing rights manager role and practice setting.  All questions answered.   Last PCP: Sheri Son, MD ([facility])  Last annual physical: 2021 Concerns: See below   ABNORMAL MAMMOGRAM:  Patient reports a recent abnormal mammogram on 12/19/2023 in which the right breast had possible asymmetry.  Patient completed a diagnostic mammo and ultrasound of the right breast on 01/15/2024 which showed a intramammary lymph node in the right breast and a cluster of cyst which had probable benign appearance.  Recommended 54-month follow-up diagnostic mammogram of the right breast.  Patient is scheduled to complete this on June 14, 2024 with the breast center at [facility]  HISTORY RENAL CELL CARCINOMA:  Patient reports a history of renal cell carcinoma and reports she has undergone 5-year surveillance with [facility] after surgery and removal of tumor.  She reports she has 1 more year to see [facility] to repeat a urinalysis and then she will be finished with surveillance.  HYPERTENSION: Sheri Murphy presents for the medical management of hypertension.  Patient's current hypertension medication regimen is: Bisoprolol-hydrochlorothiazide 10-6.25 mg daily, losartan 25 mg Patient is  currently taking prescribed medications for HTN.  Patient is  regularly keeping a check on BP at home.  Adhering to low sodium diet: Yes Exercising Regularly: Yes, interested in trying [employer] [facility] at [employer]. Denies headache,  dizziness, CP, SHOB, vision changes.   BP Readings from Last 3 Encounters:  03/15/24 136/84  11/03/23 132/80  10/09/23 118/74    DIABETES MELLITUS: Sheri Murphy presents for the medical management of diabetes. Seeing endocrinology every 6 months with Dr Dewitt Hoes with [facility]. Patient's last A1C was 6.2 in February 2025.  Current diabetes medication regimen: Patient is  adhering to a diabetic diet.  Patient is  exercising regularly.  Patient is  checking BS regularly.  Patient needing refill of Dexcom CGM sensors. Patient is  checking their feet regularly.  Denies polydipsia, polyphagia, polyuria, open wounds or ulcers on feet.    Foot exam completed per chart review in August 2024, eye exam reported completion September 2024, urine albumin completion November 2024 with endocrinology with [facility].  Wt Readings from Last 3 Encounters:  03/15/24 164 lb 6.4 oz (74.6 kg)  11/03/23 181 lb (82.1 kg)  10/09/23 181 lb (82.1 kg)   HYPERLIPIDEMIA: Sheri Murphy presents for the medical management of hyperlipidemia.  Patient reports cholesterol has been borderline in the past and is well-controlled now with diet, exercise and rosuvastatin Patient's current HLD regimen is: Rosuvastatin 5 mg Patient is  currently taking prescribed medications for HLD.  Denies myalgias.   Lab Results  Component Value Date   CHOL 126 10/09/2023   HDL 38 (L) 10/09/2023   LDLCALC 61 10/09/2023   TRIG 194 (H) 10/09/2023   CHOLHDL 3.3 10/09/2023      The following portions of the patient's history were reviewed and updated as appropriate: past medical history, past surgical history, family history, social history, allergies, medications, and problem list.   Patient  Active Problem List   Diagnosis Date Noted   Abnormal mammogram of right breast 12/25/2023   OSA (obstructive sleep apnea) 10/04/2020   Lumbar radiculopathy 12/03/2018   Chronic left-sided thoracic back pain 05/15/2018    Constipation 04/14/2018   Gastroesophageal reflux disease without esophagitis 04/14/2018   Fibroid uterus 03/30/2018   Hypertension 03/30/2018   Allergic rhinitis with postnasal drip 09/30/2017   Mild episode of recurrent major depressive disorder (HCC) 09/30/2017   History of renal cell carcinoma 08/18/2017   Anemia 01/01/2017   Borderline hypercholesterolemia 08/27/2016   Chronic tension-type headache, not intractable 08/27/2016   Low vitamin D level 08/27/2016   Controlled type 2 diabetes mellitus without complication, without long-term current use of insulin (HCC) 02/02/2016   Anxiety 01/08/2016   Benign essential HTN 01/08/2016   Uterus, adenomyosis 12/14/2013   Insomnia 08/13/2011   Obesity 08/09/2011   Past Medical History:  Diagnosis Date   Abnormal liver function 01/08/2016   Anemia    Anxiety    Constipation 11/28/2021   Contracture of right Achilles tendon 12/31/2018   Controlled type 2 diabetes mellitus without complication (HCC)    Cough 04/04/2020   Last Assessment & Plan:    I still feel related to allergies, laryngopharyngeal reflux and differential  Advised her to have a chest x-ray since she has been coughing over a year now.  Continue Zyrtec, add Singulair nightly for 1 month, recheck in 1 month.  She is on lisinopril but is pretty sure symptoms started prior to starting lisinopril and is on a very low dose for renal protection.  No impr   Dysuria 08/09/2011   Fatty liver    hx of elevated liver enzymes in past   Fibroids    Fibromyalgia    GERD (gastroesophageal reflux disease)    Headache 08/13/2011   IMO Update for Oct 2023 made by user on 10.3.23     Heart murmur    mild no cardiologist   Hematuria    History of COVID-19 04/2021   fever cough and cold symptoms, took oral antivirals, all symptoms resolved   Hypertension    IBS (irritable bowel syndrome)    Iliotibial band syndrome affecting left lower leg 02/02/2016   Insomnia    Menorrhagia     Obesity    Pelvic pain 11/28/2021   recurrent   Postoperative state 12/05/2021   Renal cell carcinoma, left (HCC) 2018   surgery only chemo/radiation not required, small spot removed   Urinary incontinence 11/28/2021   weras small pad   Urinary urgency    Vitamin D deficiency    Wears glasses    Past Surgical History:  Procedure Laterality Date   BREAST BIOPSY Right 2021   benign   COLONOSCOPY  2021   DILATATION & CURETTAGE/HYSTEROSCOPY WITH MYOSURE N/A 05/13/2018   Procedure: DILATATION & CURETTAGE/HYSTEROSCOPY WITH NOVASURE;  Surgeon: Genia Del, MD;  Location: [facility];  Service: Gynecology;  Laterality: N/A;   Left renal mass removal  07/2017   Thermal Microwave Ablation   ROBOTIC ASSISTED TOTAL HYSTERECTOMY Bilateral 12/05/2021   Procedure: XI ROBOTIC ASSISTED TOTAL HYSTERECTOMY WITH BILATERAL SALPINGECTOMY;  Surgeon: Genia Del, MD;  Location: [facility];  Service: Gynecology;  Laterality: Bilateral;   SKIN LESION EXCISION  2012   basal cell removed no problems since, sees dermatology prn now   uterine ablation  2018   WISDOM TOOTH EXTRACTION     yrs ago per pt on 11-28-2021   Family History  Problem Relation Age of Onset   Hypertension Mother    Colon cancer Mother 58   Hypertension Father    Hypertension Maternal Aunt    Cancer Maternal Grandmother        small intestine    Hypertension Maternal Grandmother    Pancreatic cancer Maternal Grandfather    Diabetes Paternal Grandmother    BRCA 1/2 Neg Hx    Breast cancer Neg Hx    Social History   Socioeconomic History   Marital status: Single    Spouse name: Not on file   Number of children: Not on file   Years of education: Not on file   Highest education level: Not on file  Occupational History   Not on file  Tobacco Use   Smoking status: Never   Smokeless tobacco: Never  Vaping Use   Vaping status: Never Used  Substance and Sexual Activity   Alcohol  use: Yes    Comment: periodically   Drug use: No   Sexual activity: Yes    Partners: Male    Birth control/protection: Surgical    Comment: 1st intercourse- 18, hysterectomy  Other Topics Concern   Not on file  Social History Narrative   Not on file   Social Drivers of Health   Financial Resource Strain: Low Risk  (03/15/2024)   Overall Financial Resource Strain (CARDIA)    Difficulty of Paying Living Expenses: Not very hard  Food Insecurity: Low Risk  (09/12/2023)   Received from [facility]   Hunger Vital Sign    Worried About Running Out of Food in the Last Year: Never true    Ran Out of Food in the Last Year: Never true  Transportation Needs: No Transportation Needs (09/12/2023)   Received from [facility]    In the past 12 months, has lack of reliable transportation kept you from medical appointments, meetings, work or from getting things needed for daily living? : No  Physical Activity: Insufficiently Active (03/15/2024)   Exercise Vital Sign    Days of Exercise per Week: 1 day    Minutes of Exercise per Session: 30 min  Stress: Stress Concern Present (03/15/2024)   [employer] of Occupational Health - Occupational Stress Questionnaire    Feeling of Stress : To some extent  Social Connections: Socially Isolated (03/15/2024)   Social Connection and Isolation Panel [NHANES]    Frequency of Communication with Friends and Family: Once a week    Frequency of Social Gatherings with Friends and Family: Never    Attends Religious Services: 1 to 4 times per year    Active Member of [employer] or Organizations: No    Attends Banker Meetings: Never    Marital Status: Never married  Intimate Partner Violence: Not At Risk (03/15/2024)   Humiliation, Afraid, Rape, and Kick questionnaire    Fear of Current or Ex-Partner: No    Emotionally Abused: No    Physically Abused: No    Sexually Abused: No   Outpatient Medications Prior to Visit  Medication  Sig Dispense Refill   Dulaglutide 0.75 MG/0.5ML SOAJ Inject 0.75 mg weekly     ibuprofen (ADVIL) 600 MG tablet TAKE 1 TABLET(600 MG) BY MOUTH EVERY 8 HOURS AS NEEDED 60 tablet 3   loratadine (CLARITIN) 10 MG tablet Take 10 mg by mouth daily.     losartan (COZAAR) 25 MG tablet Take 25 mg by mouth daily.     omeprazole (PRILOSEC) 40 MG capsule Take  40 mg by mouth daily.     rosuvastatin (CRESTOR) 5 MG tablet Take 5 mg by mouth daily.     triamcinolone cream (KENALOG) 0.1 % SMARTSIG:Topical 1-3 Times Daily     bisoprolol-hydrochlorothiazide (ZIAC) 10-6.25 MG tablet Take 1 tablet by mouth daily.     Continuous Glucose Sensor (FREESTYLE LIBRE 3 SENSOR) MISC PLACE A SENSOR EVERY 14 DAYS.     acetaminophen (TYLENOL) 500 MG tablet Take 500 mg by mouth every 6 (six) hours as needed for mild pain.     bismuth subsalicylate (PEPTO BISMOL) 262 MG/15ML suspension Take 30 mLs by mouth every 6 (six) hours as needed.     estradiol (CLIMARA) 0.0375 mg/24hr patch Place 1 patch (0.0375 mg total) onto the skin once a week. 4 patch 12   fexofenadine (ALLEGRA) 180 MG tablet Take 180 mg by mouth daily.     glipiZIDE (GLUCOTROL) 5 MG tablet Take 5 mg by mouth 2 (two) times daily. (Patient not taking: Reported on 11/03/2023)     metFORMIN (GLUCOPHAGE-XR) 500 MG 24 hr tablet Take 500 mg by mouth 2 (two) times daily.     No facility-administered medications prior to visit.   Allergies  Allergen Reactions   Amitriptyline Other (See Comments)    nightmares   Chlordiazepoxide Other (See Comments)    nightmares   Macrodantin [Nitrofurantoin] Hives    ROS: A complete ROS was performed with pertinent positives/negatives noted in the HPI. The remainder of the ROS are negative.   Objective:   Today's Vitals   03/15/24 1412 03/15/24 1458  BP: (!) 150/92 136/84  Pulse: 79   SpO2: 99%   Weight: 164 lb 6.4 oz (74.6 kg)   Height: 5' 4.75" (1.645 m)     GENERAL: Well-appearing, in NAD. Well nourished.  SKIN: Pink,  warm and dry.  Head: Normocephalic. NECK: Trachea midline. Full ROM w/o pain or tenderness. RESPIRATORY: Chest wall symmetrical. Respirations even and non-labored.  MSK: Muscle tone and strength appropriate for age.  NEUROLOGIC: No motor or sensory deficits. Steady, even gait. C2-C12 intact.  PSYCH/MENTAL STATUS: Alert, oriented x 3. Cooperative, appropriate mood and affect.      Assessment & Plan:  1. Abnormal mammogram of right breast (Primary) Patient scheduled to complete imaging at breast center in June 2025.  Will set reminder to check completion for patient.  2. History of renal cell carcinoma Resolved and under surveillance [facility] currently for the past 5 years.  Patient will finish surveillance with them and transition to PCP.  3. Controlled type 2 diabetes mellitus without complication, without long-term current use of insulin (HCC) Well-controlled currently.  Patient will continue to see endocrinology if needed and will continue on current medication regimen.  Last A1c and lab work reviewed by PCP.  Dexcom G7 sensor sent in to pharmacy.  Patient encouraged to continue good diet and exercise at this time.  Referral placed to schedule medical fitness as this would be beneficial for patient. - Continuous Glucose Sensor (DEXCOM G7 SENSOR) MISC; Apply 1 sensor to skin every 10 days  Dispense: 3 each; Refill: 12 - Ambulatory referral to [facility]  4. Benign essential HTN Well-controlled on current regimen.  Refills provided and patient to continue with lifestyle modifications, regular exercise and dietary changes. - bisoprolol-hydrochlorothiazide (ZIAC) 10-6.25 MG tablet; Take 1 tablet by mouth daily.  Dispense: 90 tablet; Refill: 3 - Ambulatory referral to [facility]  5. Borderline hypercholesterolemia Will obtain fasting lipid profile  when patient returns for physical.  Continue on Crestor at this time with lifestyle  modifications.  Patient to reach out to office if new, worrisome, or unresolved symptoms arise or if no improvement in patient's condition. Patient verbalized understanding and is agreeable to treatment plan. All questions answered to patient's satisfaction.    Return in about 6 weeks (around 04/26/2024) for ANNUAL PHYSICAL, HLD (Fasting labs w/ lab visit prior) .    Hilbert Bible, [place]

## 2024-03-16 ENCOUNTER — Other Ambulatory Visit: Payer: Self-pay

## 2024-03-17 ENCOUNTER — Encounter (HOSPITAL_BASED_OUTPATIENT_CLINIC_OR_DEPARTMENT_OTHER): Payer: Self-pay | Admitting: *Deleted

## 2024-03-17 ENCOUNTER — Encounter (HOSPITAL_BASED_OUTPATIENT_CLINIC_OR_DEPARTMENT_OTHER): Payer: Self-pay | Admitting: Family Medicine

## 2024-03-17 DIAGNOSIS — Z0000 Encounter for general adult medical examination without abnormal findings: Secondary | ICD-10-CM

## 2024-03-17 DIAGNOSIS — E119 Type 2 diabetes mellitus without complications: Secondary | ICD-10-CM

## 2024-03-17 DIAGNOSIS — E7800 Pure hypercholesterolemia, unspecified: Secondary | ICD-10-CM

## 2024-03-17 DIAGNOSIS — R7989 Other specified abnormal findings of blood chemistry: Secondary | ICD-10-CM

## 2024-03-17 NOTE — Telephone Encounter (Signed)
 Jon Gills, Please see mychart message sent by pt and advise.

## 2024-03-18 ENCOUNTER — Ambulatory Visit: Payer: BC Managed Care – PPO | Admitting: Obstetrics and Gynecology

## 2024-03-22 NOTE — Telephone Encounter (Signed)
 Please see new mychart message sent by pt about breast pain and advise.

## 2024-03-23 ENCOUNTER — Other Ambulatory Visit (HOSPITAL_BASED_OUTPATIENT_CLINIC_OR_DEPARTMENT_OTHER): Payer: Self-pay | Admitting: Family Medicine

## 2024-03-23 ENCOUNTER — Telehealth (HOSPITAL_BASED_OUTPATIENT_CLINIC_OR_DEPARTMENT_OTHER): Payer: Self-pay | Admitting: Family Medicine

## 2024-03-23 DIAGNOSIS — N644 Mastodynia: Secondary | ICD-10-CM

## 2024-03-23 NOTE — Telephone Encounter (Signed)
 Patient states she needs a sign off from [place] for a diagnostic mammogram for tomorrow 3/26

## 2024-03-24 ENCOUNTER — Ambulatory Visit

## 2024-03-24 ENCOUNTER — Ambulatory Visit
Admission: RE | Admit: 2024-03-24 | Discharge: 2024-03-24 | Discharge status: Home / Self Care | Source: Ambulatory Visit | Attending: Family Medicine | Admitting: Family Medicine

## 2024-03-24 DIAGNOSIS — N644 Mastodynia: Secondary | ICD-10-CM

## 2024-03-25 ENCOUNTER — Encounter (HOSPITAL_BASED_OUTPATIENT_CLINIC_OR_DEPARTMENT_OTHER): Payer: Self-pay | Admitting: Family Medicine

## 2024-03-25 NOTE — Progress Notes (Signed)
 Hi Sheri Murphy, Your diagnostic mammogram of the right breast does not show any suspicious masses or calcifications.  The benign oval mass was not definitively seen on the exam.  They do recommend the follow-up diagnostic mammogram to be completed in July 2025 as scheduled.  If you have any further questions please let me know

## 2024-03-25 NOTE — Telephone Encounter (Signed)
 Checked pt's chart and saw that she was able to get the mammogram performed.

## 2024-04-27 ENCOUNTER — Other Ambulatory Visit (HOSPITAL_BASED_OUTPATIENT_CLINIC_OR_DEPARTMENT_OTHER): Payer: Self-pay

## 2024-04-27 ENCOUNTER — Other Ambulatory Visit (HOSPITAL_COMMUNITY): Payer: Self-pay

## 2024-04-27 MED ORDER — NITROFURANTOIN MONOHYD MACRO 100 MG PO CAPS
100.0000 mg | ORAL_CAPSULE | Freq: Two times a day (BID) | ORAL | 0 refills | Status: DC
  Filled 2024-04-27: qty 10, 5d supply, fill #0

## 2024-05-03 ENCOUNTER — Telehealth (HOSPITAL_BASED_OUTPATIENT_CLINIC_OR_DEPARTMENT_OTHER): Payer: Self-pay | Admitting: *Deleted

## 2024-05-03 NOTE — Telephone Encounter (Signed)
 Labs are placed please schedule pt appt

## 2024-05-03 NOTE — Telephone Encounter (Signed)
 Copied from CRM [id number]. Topic: Clinical - Request for Lab/Test Order >> May 03, 2024  9:55 AM Juluis Ok wrote: Reason for CRM: Patient requesting to have labs completed before her 05/12 appointment. States she can come in on Tuesday, Wednesday, Or Thursday of this week. Please contact patient for scheduling.

## 2024-05-05 ENCOUNTER — Other Ambulatory Visit (HOSPITAL_COMMUNITY): Payer: Self-pay | Admitting: Family Medicine

## 2024-05-05 ENCOUNTER — Other Ambulatory Visit (HOSPITAL_BASED_OUTPATIENT_CLINIC_OR_DEPARTMENT_OTHER): Payer: Self-pay | Admitting: *Deleted

## 2024-05-05 DIAGNOSIS — R7989 Other specified abnormal findings of blood chemistry: Secondary | ICD-10-CM

## 2024-05-05 DIAGNOSIS — E119 Type 2 diabetes mellitus without complications: Secondary | ICD-10-CM

## 2024-05-05 DIAGNOSIS — Z0000 Encounter for general adult medical examination without abnormal findings: Secondary | ICD-10-CM | POA: Diagnosis not present

## 2024-05-05 DIAGNOSIS — E7800 Pure hypercholesterolemia, unspecified: Secondary | ICD-10-CM

## 2024-05-05 LAB — BASIC METABOLIC PANEL WITH GFR
BUN: 17 (ref 4–21)
CO2: 20 (ref 13–22)
Chloride: 104 (ref 99–108)
Creatinine: 0.8 (ref 0.5–1.1)
Glucose: 111
Potassium: 4.4 meq/L (ref 3.5–5.1)
Sodium: 141 (ref 137–147)

## 2024-05-05 LAB — COMPREHENSIVE METABOLIC PANEL WITH GFR
Albumin: 4.4 (ref 3.5–5.0)
Calcium: 9.6 (ref 8.7–10.7)
Globulin: 2.5
eGFR: 82

## 2024-05-05 LAB — CBC: RBC: 5.14 — AB (ref 3.87–5.11)

## 2024-05-05 LAB — HEPATIC FUNCTION PANEL
ALT: 17 U/L (ref 7–35)
AST: 16 (ref 13–35)
Alkaline Phosphatase: 53 (ref 25–125)

## 2024-05-05 LAB — CBC AND DIFFERENTIAL
HCT: 42 (ref 36–46)
Hemoglobin: 13.7 (ref 12.0–16.0)
Neutrophils Absolute: 5.2
Platelets: 290 10*3/uL (ref 150–400)
WBC: 9.3

## 2024-05-05 LAB — LIPID PANEL
Cholesterol: 123 (ref 0–200)
HDL: 40 (ref 35–70)
LDL Cholesterol: 60
Triglycerides: 129 (ref 40–160)

## 2024-05-05 LAB — VITAMIN D 25 HYDROXY (VIT D DEFICIENCY, FRACTURES): Vit D, 25-Hydroxy: 88

## 2024-05-10 ENCOUNTER — Other Ambulatory Visit (HOSPITAL_BASED_OUTPATIENT_CLINIC_OR_DEPARTMENT_OTHER): Payer: Self-pay

## 2024-05-10 ENCOUNTER — Encounter (HOSPITAL_BASED_OUTPATIENT_CLINIC_OR_DEPARTMENT_OTHER): Payer: Self-pay | Admitting: Family Medicine

## 2024-05-10 ENCOUNTER — Other Ambulatory Visit: Payer: Self-pay

## 2024-05-10 ENCOUNTER — Ambulatory Visit (INDEPENDENT_AMBULATORY_CARE_PROVIDER_SITE_OTHER): Admitting: Family Medicine

## 2024-05-10 VITALS — BP 130/80 | HR 75 | Ht 64.75 in | Wt 168.0 lb

## 2024-05-10 DIAGNOSIS — R928 Other abnormal and inconclusive findings on diagnostic imaging of breast: Secondary | ICD-10-CM

## 2024-05-10 DIAGNOSIS — Z0000 Encounter for general adult medical examination without abnormal findings: Secondary | ICD-10-CM

## 2024-05-10 DIAGNOSIS — F32A Depression, unspecified: Secondary | ICD-10-CM | POA: Diagnosis not present

## 2024-05-10 DIAGNOSIS — E119 Type 2 diabetes mellitus without complications: Secondary | ICD-10-CM | POA: Diagnosis not present

## 2024-05-10 DIAGNOSIS — I10 Essential (primary) hypertension: Secondary | ICD-10-CM

## 2024-05-10 DIAGNOSIS — E7800 Pure hypercholesterolemia, unspecified: Secondary | ICD-10-CM

## 2024-05-10 DIAGNOSIS — Z1211 Encounter for screening for malignant neoplasm of colon: Secondary | ICD-10-CM

## 2024-05-10 DIAGNOSIS — F419 Anxiety disorder, unspecified: Secondary | ICD-10-CM | POA: Diagnosis not present

## 2024-05-10 MED ORDER — TRIAMCINOLONE ACETONIDE 0.1 % EX CREA
TOPICAL_CREAM | Freq: Two times a day (BID) | CUTANEOUS | 2 refills | Status: AC | PRN
  Filled 2024-05-10: qty 30, 30d supply, fill #0

## 2024-05-10 MED ORDER — DULAGLUTIDE 0.75 MG/0.5ML ~~LOC~~ SOAJ
0.7500 mg | SUBCUTANEOUS | 3 refills | Status: AC
  Filled 2024-05-10 – 2024-05-28 (×2): qty 6, 84d supply, fill #0
  Filled 2024-08-19: qty 6, 84d supply, fill #1
  Filled 2024-09-09 – 2024-11-12 (×2): qty 6, 84d supply, fill #2
  Filled 2025-01-21: qty 6, 84d supply, fill #3

## 2024-05-10 MED ORDER — BISOPROLOL-HYDROCHLOROTHIAZIDE 10-6.25 MG PO TABS
1.0000 | ORAL_TABLET | Freq: Every day | ORAL | 3 refills | Status: AC
  Filled 2024-05-10 – 2024-06-25 (×2): qty 90, 90d supply, fill #0
  Filled 2024-09-09: qty 90, 90d supply, fill #1
  Filled 2024-12-20: qty 90, 90d supply, fill #2

## 2024-05-10 MED ORDER — LORATADINE 10 MG PO TABS
10.0000 mg | ORAL_TABLET | Freq: Every day | ORAL | 3 refills | Status: AC
  Filled 2024-05-10: qty 90, 90d supply, fill #0
  Filled 2024-09-09: qty 90, 90d supply, fill #1
  Filled 2024-12-04: qty 90, 90d supply, fill #2

## 2024-05-10 MED ORDER — FLUOXETINE HCL 10 MG PO CAPS
10.0000 mg | ORAL_CAPSULE | Freq: Every day | ORAL | 3 refills | Status: DC
  Filled 2024-05-10: qty 30, 30d supply, fill #0
  Filled 2024-09-09: qty 30, 30d supply, fill #1
  Filled 2024-10-08: qty 30, 30d supply, fill #2

## 2024-05-10 MED ORDER — OMEPRAZOLE 40 MG PO CPDR
40.0000 mg | DELAYED_RELEASE_CAPSULE | Freq: Every day | ORAL | 3 refills | Status: AC
  Filled 2024-05-10 – 2024-09-09 (×2): qty 90, 90d supply, fill #0
  Filled 2024-12-04: qty 90, 90d supply, fill #1

## 2024-05-10 MED ORDER — LOSARTAN POTASSIUM 25 MG PO TABS
25.0000 mg | ORAL_TABLET | Freq: Every day | ORAL | 3 refills | Status: DC
  Filled 2024-05-10 – 2024-05-28 (×3): qty 90, 90d supply, fill #0
  Filled 2024-09-09: qty 90, 90d supply, fill #1

## 2024-05-10 NOTE — Patient Instructions (Addendum)
 Trial Rosuvastatin:  Stop taking Rosuvastatin and start taking Omega 3 Fish oil   Please return for  a lab appt in 4 months to recheck lipids.    Counseling and Mental Health Resources   [facility]  - For Women and Girls only - Cost based upon sliding scale of income - Financial Aid available  [phone] [address], [address] [place], [place] [address] [facility]  - Mental Health, Substance Abuse Treatment - IV Ketamine, Hydration and Weight Loss Programs - Center for Treatment for Resistant Depression and Suicidal Ideation  [address] [place], [place] [address]  [phone] [email or web address]   [facility]  - Individual and Family Counseling - Assessments and Therapy for Learning Disabilities, ADHD, Autism Spectrum Disorder, Processing Deficits  [phone] [address], [address] [place], [place] [address]  [facility]  [address] [place] [address] [place], [place] [address]  [phone]  [facility]  - Individual, Family, Play and Group Therapy - In person and telehealth sessions available  Phone: [phone] Email: [email or web address]  [place] Location:   [address] [place] [address] [place], [place] [address]   [place] [facility] Location:   [address] [place], [place] [address] Leonides Ramp MA Clinical Psychology  [address] [place] [address]  [phone]  [facility], [facility]  Adult, Adolescent and [facility]  [address], [place], [place] [address]  Text:  [phone]   Call:  [phone] Email: [email or web address] [facility] - [facility]    [facility]  - Individual, Group Therapy - Day Programs, Wellness Coaching - Staff Programming, Workshops  [address], [place], [place] [address]  [phone]  [facility], [facility]  - Children, Adolescent, Adult and Family Therapy  [place] Location [phone]  [address] [place], [place]  [address]   [place] Location [phone]  [address] [place], [place]  [address]   [facility]  - Indivudual Counseling, [facility] [address] [place], [place] [address]  [phone] [facility]  -Services at "less than a basic fee" sponsored by [facility]  [address] [place], [place] [address]  [phone]   [facility]  Counseling offered by Psychology Doctoral Students  - Majority of Patients qualify for financial assistance   [address] [place], [place] [address]  [phone]
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
## 2024-05-11 ENCOUNTER — Encounter (HOSPITAL_BASED_OUTPATIENT_CLINIC_OR_DEPARTMENT_OTHER): Payer: Self-pay | Admitting: Family Medicine

## 2024-05-14 ENCOUNTER — Other Ambulatory Visit (HOSPITAL_BASED_OUTPATIENT_CLINIC_OR_DEPARTMENT_OTHER): Payer: Self-pay

## 2024-05-16 LAB — CBC WITH DIFFERENTIAL/PLATELET
Basophils Absolute: 0.1 10*3/uL (ref 0.0–0.2)
Basos: 1 %
EOS (ABSOLUTE): 0.5 10*3/uL — ABNORMAL HIGH (ref 0.0–0.4)
Eos: 5 %
Hematocrit: 42.4 % (ref 34.0–46.6)
Hemoglobin: 13.7 g/dL (ref 11.1–15.9)
Immature Grans (Abs): 0 10*3/uL (ref 0.0–0.1)
Immature Granulocytes: 0 %
Lymphocytes Absolute: 3.1 10*3/uL (ref 0.7–3.1)
Lymphs: 33 %
MCH: 26.7 pg (ref 26.6–33.0)
MCHC: 32.3 g/dL (ref 31.5–35.7)
MCV: 83 fL (ref 79–97)
Monocytes Absolute: 0.5 10*3/uL (ref 0.1–0.9)
Monocytes: 5 %
Neutrophils Absolute: 5.2 10*3/uL (ref 1.4–7.0)
Neutrophils: 56 %
Platelets: 290 10*3/uL (ref 150–450)
RBC: 5.14 x10E6/uL (ref 3.77–5.28)
RDW: 14.6 % (ref 11.7–15.4)
WBC: 9.3 10*3/uL (ref 3.4–10.8)

## 2024-05-16 LAB — COMPREHENSIVE METABOLIC PANEL WITH GFR
ALT: 17 IU/L (ref 0–32)
AST: 16 IU/L (ref 0–40)
Albumin: 4.4 g/dL (ref 3.8–4.9)
Alkaline Phosphatase: 53 IU/L (ref 44–121)
BUN/Creatinine Ratio: 20 (ref 9–23)
BUN: 17 mg/dL (ref 6–24)
Bilirubin Total: <0.2 mg/dL (ref 0.0–1.2)
CO2: 20 mmol/L (ref 20–29)
Calcium: 9.6 mg/dL (ref 8.7–10.2)
Chloride: 104 mmol/L (ref 96–106)
Creatinine, Ser: 0.84 mg/dL (ref 0.57–1.00)
Globulin, Total: 2.5 g/dL (ref 1.5–4.5)
Glucose: 111 mg/dL — ABNORMAL HIGH (ref 70–99)
Potassium: 4.4 mmol/L (ref 3.5–5.2)
Sodium: 141 mmol/L (ref 134–144)
Total Protein: 6.9 g/dL (ref 6.0–8.5)
eGFR: 82 mL/min/{1.73_m2} (ref >59)

## 2024-05-16 LAB — LIPID PANEL
Chol/HDL Ratio: 3.1 ratio (ref 0.0–4.4)
Cholesterol, Total: 123 mg/dL (ref 100–199)
HDL: 40 mg/dL (ref >39)
LDL Chol Calc (NIH): 60 mg/dL (ref 0–99)
Triglycerides: 129 mg/dL (ref 0–149)
VLDL Cholesterol Cal: 23 mg/dL (ref 5–40)

## 2024-05-16 LAB — VITAMIN D 25 HYDROXY (VIT D DEFICIENCY, FRACTURES): Vit D, 25-Hydroxy: 88 ng/mL (ref 30.0–100.0)

## 2024-05-16 LAB — C-REACTIVE PROTEIN: CRP: <1 mg/L (ref 0–10)

## 2024-05-16 LAB — INSULIN, FREE AND TOTAL
Free Insulin: 42 uU/mL — ABNORMAL HIGH
Total Insulin: 42 uU/mL

## 2024-05-16 LAB — MAGNESIUM: Magnesium: 2.1 mg/dL (ref 1.6–2.3)

## 2024-05-18 ENCOUNTER — Ambulatory Visit (HOSPITAL_BASED_OUTPATIENT_CLINIC_OR_DEPARTMENT_OTHER): Payer: Self-pay | Admitting: Family Medicine

## 2024-05-18 NOTE — Progress Notes (Signed)
 Hi Sheri Murphy,  Your blood counts were normal.  Your kidney function, electrolytes and liver function are stable.  Your free insulin  is slightly elevated and to be expected in diabetic patients.  Your cholesterol is excellent and well-controlled.  I know we are trialing off from the Crestor for a few months and we will recheck this.  Your vitamin D  is stable and magnesium was normal.  Your CRP was negative for signs of inflammation.

## 2024-05-20 NOTE — Telephone Encounter (Signed)
 Jon Gills, please see mychart message sent by pt and advise.

## 2024-05-28 ENCOUNTER — Other Ambulatory Visit (HOSPITAL_BASED_OUTPATIENT_CLINIC_OR_DEPARTMENT_OTHER): Payer: Self-pay

## 2024-06-04 ENCOUNTER — Other Ambulatory Visit (HOSPITAL_BASED_OUTPATIENT_CLINIC_OR_DEPARTMENT_OTHER): Payer: Self-pay

## 2024-06-07 ENCOUNTER — Other Ambulatory Visit: Payer: Self-pay | Admitting: Obstetrics and Gynecology

## 2024-06-07 DIAGNOSIS — N63 Unspecified lump in unspecified breast: Secondary | ICD-10-CM

## 2024-06-07 DIAGNOSIS — N6489 Other specified disorders of breast: Secondary | ICD-10-CM

## 2024-06-07 DIAGNOSIS — R928 Other abnormal and inconclusive findings on diagnostic imaging of breast: Secondary | ICD-10-CM

## 2024-06-14 ENCOUNTER — Other Ambulatory Visit: Payer: BC Managed Care – PPO

## 2024-06-25 ENCOUNTER — Other Ambulatory Visit (HOSPITAL_BASED_OUTPATIENT_CLINIC_OR_DEPARTMENT_OTHER): Payer: Self-pay

## 2024-06-25 ENCOUNTER — Encounter: Payer: Self-pay | Admitting: Family Medicine

## 2024-07-05 ENCOUNTER — Encounter (HOSPITAL_BASED_OUTPATIENT_CLINIC_OR_DEPARTMENT_OTHER): Payer: Self-pay | Admitting: Family Medicine

## 2024-07-05 ENCOUNTER — Other Ambulatory Visit (HOSPITAL_BASED_OUTPATIENT_CLINIC_OR_DEPARTMENT_OTHER): Payer: Self-pay | Admitting: Family Medicine

## 2024-07-05 ENCOUNTER — Telehealth (HOSPITAL_BASED_OUTPATIENT_CLINIC_OR_DEPARTMENT_OTHER): Admitting: Family Medicine

## 2024-07-05 VITALS — Ht 64.75 in | Wt 165.0 lb

## 2024-07-05 DIAGNOSIS — Z206 Contact with and (suspected) exposure to human immunodeficiency virus [HIV]: Secondary | ICD-10-CM

## 2024-07-05 DIAGNOSIS — E119 Type 2 diabetes mellitus without complications: Secondary | ICD-10-CM

## 2024-07-05 DIAGNOSIS — F419 Anxiety disorder, unspecified: Secondary | ICD-10-CM | POA: Diagnosis not present

## 2024-07-05 DIAGNOSIS — F32A Depression, unspecified: Secondary | ICD-10-CM
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
## 2024-07-13 ENCOUNTER — Other Ambulatory Visit (HOSPITAL_BASED_OUTPATIENT_CLINIC_OR_DEPARTMENT_OTHER): Payer: Self-pay | Admitting: Family Medicine

## 2024-07-13 DIAGNOSIS — Z206 Contact with and (suspected) exposure to human immunodeficiency virus [HIV]: Secondary | ICD-10-CM

## 2024-07-15 ENCOUNTER — Other Ambulatory Visit: Payer: Self-pay | Admitting: Obstetrics and Gynecology

## 2024-07-15 ENCOUNTER — Ambulatory Visit
Admission: RE | Admit: 2024-07-15 | Discharge: 2024-07-15 | Disposition: A | Discharge status: Home / Self Care | Source: Ambulatory Visit | Attending: Obstetrics and Gynecology | Admitting: Obstetrics and Gynecology

## 2024-07-15 DIAGNOSIS — N6315 Unspecified lump in the right breast, overlapping quadrants: Secondary | ICD-10-CM | POA: Diagnosis not present

## 2024-07-15 DIAGNOSIS — N6310 Unspecified lump in the right breast, unspecified quadrant: Secondary | ICD-10-CM

## 2024-07-15 DIAGNOSIS — N63 Unspecified lump in unspecified breast: Secondary | ICD-10-CM

## 2024-07-23 ENCOUNTER — Ambulatory Visit: Payer: Self-pay | Admitting: Obstetrics and Gynecology

## 2024-07-26 ENCOUNTER — Encounter (HOSPITAL_BASED_OUTPATIENT_CLINIC_OR_DEPARTMENT_OTHER): Payer: Self-pay | Admitting: Family Medicine

## 2024-07-27 ENCOUNTER — Encounter (HOSPITAL_BASED_OUTPATIENT_CLINIC_OR_DEPARTMENT_OTHER): Payer: Self-pay | Admitting: Family Medicine

## 2024-07-27 ENCOUNTER — Telehealth (HOSPITAL_BASED_OUTPATIENT_CLINIC_OR_DEPARTMENT_OTHER): Admitting: Family Medicine

## 2024-07-27 ENCOUNTER — Ambulatory Visit: Payer: Self-pay

## 2024-07-27 ENCOUNTER — Ambulatory Visit: Payer: Self-pay | Admitting: Family Medicine

## 2024-07-27 DIAGNOSIS — F32A Depression, unspecified: Secondary | ICD-10-CM | POA: Diagnosis not present

## 2024-07-27 DIAGNOSIS — F419 Anxiety disorder, unspecified: Secondary | ICD-10-CM

## 2024-07-27 MED ORDER — BUPROPION HCL ER (XL) 150 MG PO TB24: 150.0000 mg | ORAL_TABLET | Freq: Every day | ORAL | 3 refills | Status: DC

## 2024-07-27 MED ORDER — HYDROXYZINE HCL 25 MG PO TABS
25.0000 mg | ORAL_TABLET | Freq: Every evening | ORAL | 3 refills | Status: DC | PRN
Start: 2024-07-27 — End: 2024-08-18

## 2024-07-27 NOTE — Telephone Encounter (Signed)
 Jon Gills, Please see mychart message sent by pt and advise.
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
## 2024-07-27 NOTE — Telephone Encounter (Signed)
 FYI Only or Action Required?: FYI only for provider.  Patient was last seen in primary care on 07/05/2024 by Knute Thersia Bitters, FNP.  Called Nurse Triage reporting Anxiety.  Symptoms began Ongoing.  Interventions attempted: Prescription medications: Tried 1 medication prescribed - did not work well for her.  Symptoms are: gradually worsening.  Triage Disposition: See PCP When Office is Open (Within 3 Days)  Patient/caregiver understands and will follow disposition?: Yes                        Copied from CRM [id number]. Topic: Clinical - Red Word Triage >> Jul 27, 2024 11:31 AM Turkey B wrote: [facility] that prompted transfer to Nurse Triage: pt called in having anxiety Reason for Disposition  MODERATE anxiety (e.g., persistent or frequent anxiety symptoms; interferes with sleep, school, or work)  Answer Assessment - Initial Assessment Questions 1. CONCERN: Did anything happen that prompted you to call today?      Started on Prozac  - did not like it. Crying all the time, about to snap at work. 2. ANXIETY SYMPTOMS: Can you describe how you (your loved one; patient) have been feeling? (e.g., tense, restless, panicky, anxious, keyed up, overwhelmed, sense of impending doom).      Crying stress 3. ONSET: How long have you been feeling this way? (e.g., hours, days, weeks)     ongoing 4. SEVERITY: How would you rate the level of anxiety? (e.g., 0 - 10; or mild, moderate, severe).     moderate 5. FUNCTIONAL IMPAIRMENT: How have these feelings affected your ability to do daily activities? Have you had more difficulty than usual doing your normal daily activities? (e.g., getting better, same, worse; self-care, school, work, interactions)     Yes - getting difficult 6. HISTORY: Have you felt this way before? Have you ever been diagnosed with an anxiety problem in the past? (e.g., generalized anxiety disorder, panic attacks, PTSD). If Yes, ask: How was  this problem treated? (e.g., medicines, counseling, etc.)     yes 7. RISK OF HARM - SUICIDAL IDEATION: Do you ever have thoughts of hurting or killing yourself? If Yes, ask:  Do you have these feelings now? Do you have a plan on how you would do this?     yes 8. TREATMENT:  What has been done so far to treat this anxiety? (e.g., medicines, relaxation strategies). What has helped?     Tried one medication 9. THERAPIST: Do you have a counselor or therapist? If Yes, ask: What is their name?     no 10. POTENTIAL TRIGGERS: Do you drink caffeinated beverages (e.g., coffee, colas, teas), and how much daily? Do you drink alcohol or use any drugs? Have you started any new medicines recently?       no 11. PATIENT SUPPORT: Who is with you now? Who do you live with? Do you have family or friends who you can talk to?        Friend, brother 26. OTHER SYMPTOMS: Do you have any other symptoms? (e.g., feeling depressed, trouble concentrating, trouble sleeping, trouble breathing, palpitations or fast heartbeat, chest pain, sweating, nausea, or diarrhea)       Cutting fingernails.  Protocols used: Anxiety and Panic Attack-A-AH

## 2024-07-27 NOTE — Patient Instructions (Signed)
 Counseling and Mental Health Resources   [facility]  - For Women and Girls only - Cost based upon sliding scale of income - Financial Aid available  [phone] [address], [address] [place], [place] [address] [facility]  - Mental Health, Substance Abuse Treatment - IV Ketamine, Hydration and Weight Loss Programs - Center for Treatment for Resistant Depression and Suicidal Ideation  [address] [place], [place] [address]  [phone] [email or web address]   [facility]  - Individual and Family Counseling - Assessments and Therapy for Learning Disabilities, ADHD, Autism Spectrum Disorder, Processing Deficits  [phone] [address] [place], [place] [address]  [facility]  [address] [place] [address] [place], [place] [address]  [phone]  [facility]  - Individual, Family, Play and Group Therapy - In person and telehealth sessions available  Phone: [phone] Email: [email or web address]  [place] Location:   [address] [place] [address] [place], [place] [address]   [place] Location:   [address] [place], [place] [address] Su Ley MA Clinical Psychology  [address] [place] [address]  [phone]  [facility], [facility]  Adult, Adolescent and [facility]  [address], [place], [place] [address]  Text:  [phone]   Call:  [phone] Email: [email or web address] [facility] - [facility]    [facility]  - Individual, Group Therapy - Day Programs, Wellness Coaching - Staff Programming, Workshops  [address], [place], [place] [address]  [phone]  [facility], [facility]  - Children, Adolescent, Adult and Family  Therapy  [place] Location [phone]  [address] [place], [place] [address]   [place] Location [phone]  [address] [place], [place] [address]   [facility]  - Indivudual Counseling, [facility] [address] [place], [place] [address]  [phone] [facility]  -Services at "less than a basic fee" sponsored by [facility]  [address] [place], [place] [address]  [phone]   [facility]  Counseling offered by Psychology [employer]  - Majority of Patients qualify for financial assistance   [address] [place], [place] [address]  [phone]

## 2024-07-27 NOTE — Telephone Encounter (Signed)
 Called patient scheduled for mychart visit 7/29 with Sheri Murphy

## 2024-07-27 NOTE — Telephone Encounter (Signed)
 Copied from CRM [id number]. Topic: Appointments - Scheduling Inquiry for Clinic >> Jul 27, 2024 11:47 AM Jasmin G wrote: Reason for CRM: Pt called because she was told by a nurse that they would try to schedule her for appt today, please refer to today's NT interaction for more info and call pt back ASAP

## 2024-07-28 ENCOUNTER — Ambulatory Visit (HOSPITAL_BASED_OUTPATIENT_CLINIC_OR_DEPARTMENT_OTHER): Admitting: Family Medicine

## 2024-08-13 ENCOUNTER — Encounter (HOSPITAL_BASED_OUTPATIENT_CLINIC_OR_DEPARTMENT_OTHER): Payer: Self-pay | Admitting: Family Medicine

## 2024-08-18 ENCOUNTER — Other Ambulatory Visit (HOSPITAL_BASED_OUTPATIENT_CLINIC_OR_DEPARTMENT_OTHER): Payer: Self-pay | Admitting: Family Medicine

## 2024-08-18 ENCOUNTER — Other Ambulatory Visit (HOSPITAL_BASED_OUTPATIENT_CLINIC_OR_DEPARTMENT_OTHER): Payer: Self-pay | Admitting: *Deleted

## 2024-08-18 DIAGNOSIS — Z206 Contact with and (suspected) exposure to human immunodeficiency virus [HIV]: Secondary | ICD-10-CM | POA: Diagnosis not present

## 2024-08-18 MED ORDER — BUPROPION HCL ER (XL) 300 MG PO TB24: 300.0000 mg | ORAL_TABLET | Freq: Every day | ORAL | 3 refills | Status: DC

## 2024-08-18 MED ORDER — ROSUVASTATIN CALCIUM 5 MG PO TABS: 5.0000 mg | ORAL_TABLET | Freq: Every day | ORAL | 3 refills | Status: AC

## 2024-08-19 ENCOUNTER — Ambulatory Visit (HOSPITAL_BASED_OUTPATIENT_CLINIC_OR_DEPARTMENT_OTHER): Payer: Self-pay | Admitting: Family Medicine

## 2024-08-19 LAB — HIV ANTIBODY (ROUTINE TESTING W REFLEX): HIV Screen 4th Generation wRfx: NONREACTIVE

## 2024-08-19 NOTE — Progress Notes (Signed)
 Hi Evadean,  Your HIV testing is negative for infection. If you have further questions or concerns, please let me know.

## 2024-08-23 ENCOUNTER — Ambulatory Visit (HOSPITAL_BASED_OUTPATIENT_CLINIC_OR_DEPARTMENT_OTHER): Admitting: Family Medicine

## 2024-09-02 ENCOUNTER — Ambulatory Visit: Admitting: Podiatry

## 2024-09-09 ENCOUNTER — Other Ambulatory Visit (HOSPITAL_BASED_OUTPATIENT_CLINIC_OR_DEPARTMENT_OTHER): Payer: Self-pay

## 2024-09-09 ENCOUNTER — Other Ambulatory Visit (HOSPITAL_BASED_OUTPATIENT_CLINIC_OR_DEPARTMENT_OTHER)

## 2024-09-09 ENCOUNTER — Encounter: Payer: Self-pay | Admitting: Podiatry

## 2024-09-09 ENCOUNTER — Telehealth: Payer: Self-pay | Admitting: *Deleted

## 2024-09-09 ENCOUNTER — Ambulatory Visit: Admitting: Podiatry

## 2024-09-09 ENCOUNTER — Other Ambulatory Visit: Payer: Self-pay

## 2024-09-09 ENCOUNTER — Telehealth (HOSPITAL_BASED_OUTPATIENT_CLINIC_OR_DEPARTMENT_OTHER): Payer: Self-pay | Admitting: *Deleted

## 2024-09-09 ENCOUNTER — Ambulatory Visit

## 2024-09-09 DIAGNOSIS — G5763 Lesion of plantar nerve, bilateral lower limbs: Secondary | ICD-10-CM

## 2024-09-09 DIAGNOSIS — M722 Plantar fascial fibromatosis: Secondary | ICD-10-CM

## 2024-09-09 DIAGNOSIS — Z13220 Encounter for screening for lipoid disorders: Secondary | ICD-10-CM

## 2024-09-09 DIAGNOSIS — M7731 Calcaneal spur, right foot: Secondary | ICD-10-CM | POA: Diagnosis not present

## 2024-09-09 DIAGNOSIS — M79672 Pain in left foot: Secondary | ICD-10-CM | POA: Diagnosis not present

## 2024-09-09 DIAGNOSIS — M79671 Pain in right foot: Secondary | ICD-10-CM | POA: Diagnosis not present

## 2024-09-09 MED ORDER — MELOXICAM 15 MG PO TABS
15.0000 mg | ORAL_TABLET | Freq: Every day | ORAL | 0 refills | Status: DC
  Filled 2024-09-09: qty 30, 30d supply, fill #0

## 2024-09-09 NOTE — Telephone Encounter (Signed)
 Copied from CRM #[id number]. Topic: General - Other >> Sep 09, 2024  8:07 AM Cleave MATSU wrote: Reason for CRM: pt wants to know if she can come in today and get her lab done instead of tomorrow please call pt back to assist

## 2024-09-09 NOTE — Telephone Encounter (Signed)
 I called the patient to see if she could arrive at 2:10 pm for her appointment today because we need xrays of her feet.

## 2024-09-09 NOTE — Telephone Encounter (Signed)
 Called and spoke with pt letting her know that she could come today 9/11 for labwork and she verbalized understanding.nothing further needed.

## 2024-09-09 NOTE — Progress Notes (Signed)
 Subjective:  Patient ID: Sheri Murphy, female    DOB: [birth year],   MRN: [id number]  Chief Complaint  Patient presents with   Diabetes    I have foot pain, this whole toe joint of the 2nd and 5th toes hurt.  I went to the [employer] and got inserts.  They didn't help so I stopped wearing them about 10 days ago.  I think I may have cut my cuticle too short on my right little toe.  I'm Diabetic.  Saw Thersia Stark, FNP - 07/27/2024; A1c - 6.2     58 y.o. female presents for concern of bilateral foot pain that has been ongoing for a while as above. Relates burning and tingling in their feet and sensitive moslty in the right second and fifth toe and some on the left. Does have some lower back pains. Has tried tyelnol which helps. . Patient is diabetic and last A1c was  Lab Results  Component Value Date   HGBA1C 7.8 (H) 10/09/2023   .   PCP:  Stark Thersia Bitters, FNP     . Denies any other pedal complaints. Denies n/v/f/c.   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  Continue Zyrtec, add Singulair nightly for 1 month, recheck in 1 month.  She is on lisinopril but is pretty sure symptoms started prior to starting lisinopril and is on a very low dose for renal protection.  No impr   Dysuria 08/09/2011   Fatty liver    hx of elevated liver enzymes in past   Fibroids    Fibromyalgia    GERD (gastroesophageal reflux disease)    Headache 08/13/2011   IMO Update for Oct 2023 made by user on 10.3.23     Heart murmur    mild no cardiologist   Hematuria    History of COVID-19 04/2021    fever cough and cold symptoms, took oral antivirals, all symptoms resolved   Hypertension    IBS (irritable bowel syndrome)    Iliotibial band syndrome affecting left lower leg 02/02/2016   Insomnia    Menorrhagia    Obesity    Pelvic pain 11/28/2021   recurrent   Postoperative state 12/05/2021   Renal cell carcinoma, left (HCC) 2018   surgery only chemo/radiation not required, small spot removed   Urinary incontinence 11/28/2021   weras small pad   Urinary urgency    Vitamin D  deficiency    Wears glasses     Objective:  Physical Exam: Vascular: DP/PT pulses 2/4 bilateral. CFT <3 seconds. Normal hair growth on digits. No edema.  Skin. No lacerations or abrasions bilateral feet.  Musculoskeletal: MMT 5/5 bilateral lower extremities in DF, PF, Inversion and Eversion. Deceased ROM in DF of ankle joint. Tender to third interspace on right some on the left. Tenderness relates to distal second and fifth digit. Pain with metatarsal squeeze. Positive mulders click on right.  Neurological: Sensation intact to light touch.   Assessment:   1. Morton's metatarsalgia, neuralgia, or neuroma, bilateral      Plan:  Patient was evaluated and treated and all questions answered. Discussed neuroma and treatment options with patient.  Radiographs reviewed and discussed with patient. No acute fractures or dislocations. There are additional sesamoid bones to lesser metatarsal heads bilateral.  Injection deferred today.  Discussed padding and offloading today.  Prescription for meloxicam  provided. CMP reviewed and kidney function wnl.  Discussed if pain does not improve may consider  MRI for further surgical planning.  Patient to return in 6 weeks or sooner if concerns arise.     Asberry Failing, DPM

## 2024-09-10 ENCOUNTER — Other Ambulatory Visit (HOSPITAL_BASED_OUTPATIENT_CLINIC_OR_DEPARTMENT_OTHER)

## 2024-09-22 ENCOUNTER — Encounter (HOSPITAL_BASED_OUTPATIENT_CLINIC_OR_DEPARTMENT_OTHER): Payer: Self-pay | Admitting: Family Medicine

## 2024-10-08 ENCOUNTER — Encounter (HOSPITAL_BASED_OUTPATIENT_CLINIC_OR_DEPARTMENT_OTHER): Payer: Self-pay | Admitting: Family Medicine

## 2024-10-08 ENCOUNTER — Ambulatory Visit (INDEPENDENT_AMBULATORY_CARE_PROVIDER_SITE_OTHER): Admitting: Family Medicine

## 2024-10-08 ENCOUNTER — Other Ambulatory Visit (HOSPITAL_BASED_OUTPATIENT_CLINIC_OR_DEPARTMENT_OTHER): Payer: Self-pay

## 2024-10-08 ENCOUNTER — Other Ambulatory Visit: Payer: Self-pay

## 2024-10-08 VITALS — BP 140/92 | HR 73 | Ht 64.75 in | Wt 174.0 lb

## 2024-10-08 DIAGNOSIS — F5101 Primary insomnia: Secondary | ICD-10-CM

## 2024-10-08 DIAGNOSIS — E119 Type 2 diabetes mellitus without complications: Secondary | ICD-10-CM | POA: Diagnosis not present

## 2024-10-08 DIAGNOSIS — G4733 Obstructive sleep apnea (adult) (pediatric): Secondary | ICD-10-CM

## 2024-10-08 DIAGNOSIS — F419 Anxiety disorder, unspecified: Secondary | ICD-10-CM | POA: Diagnosis not present

## 2024-10-08 DIAGNOSIS — E1169 Type 2 diabetes mellitus with other specified complication: Secondary | ICD-10-CM

## 2024-10-08 DIAGNOSIS — I10 Essential (primary) hypertension: Secondary | ICD-10-CM | POA: Diagnosis not present

## 2024-10-08 DIAGNOSIS — F32A Depression, unspecified: Secondary | ICD-10-CM

## 2024-10-08 DIAGNOSIS — E785 Hyperlipidemia, unspecified: Secondary | ICD-10-CM

## 2024-10-08 DIAGNOSIS — Z1211 Encounter for screening for malignant neoplasm of colon: Secondary | ICD-10-CM

## 2024-10-08 LAB — POCT UA - MICROALBUMIN
Albumin/Creatinine Ratio, Urine, POC: <30
Creatinine, POC: 100 mg/dL
Microalbumin Ur, POC: 30 mg/L

## 2024-10-08 MED ORDER — ESZOPICLONE 1 MG PO TABS
1.0000 mg | ORAL_TABLET | Freq: Every evening | ORAL | 2 refills | Status: DC | PRN
  Filled 2024-10-08: qty 30, 30d supply, fill #0

## 2024-10-08 MED ORDER — FLUOXETINE HCL 10 MG PO CAPS
10.0000 mg | ORAL_CAPSULE | Freq: Every day | ORAL | 3 refills | Status: DC
  Filled 2024-10-08: qty 90, 90d supply, fill #0

## 2024-10-08 NOTE — Progress Notes (Signed)
 Subjective:   Sheri Murphy 1966/08/10 10/08/2024  Chief Complaint  Patient presents with   Anxiety    Pt states she began taking prozac  again about 1 month ago. States when she was taking wellbutrin  at the prior dose she was on, she felt like it was too strong. Since switching back to prozac , she has been feeling better.     HPI: Sheri Murphy presents today for re-assessment and management of chronic medical conditions.  HYPERLIPIDEMIA: Sheri Murphy presents for the medical management of hyperlipidemia.  Patient's current HLD regimen is: Crestor  10mg . Reports she has restarted and does experience some mild myalgia intermittent but is tolerable.  Patient is  currently taking prescribed medications for HLD.  Lab Results  Component Value Date   CHOL 123 05/05/2024   HDL 40 05/05/2024   LDLCALC 60 05/05/2024   TRIG 129 05/05/2024   CHOLHDL 3.1 05/05/2024    DIABETES MELLITUS: Sheri Murphy presents for the medical management of diabetes.  Current diabetes medication regimen: Trulicity  0.75mg  weekly ;  Patient is  adhering to a diabetic diet.  Patient is  exercising regularly.  Patient is  checking BS regularly. Avg: 147 per Dexcom Sensory   Review of Dexcom:  Dexcom: 92% in range; GMI 6.7%, no hypoglycemic events.    Patient is  checking their feet regularly.  Denies polydipsia, polyphagia, polyuria, open wounds or ulcers on feet.    A1C 02/17/2024: 6.2   Foot Exam: 10/08/2024 Lab Results  Component Value Date   MICROALBUR 30 10/08/2024    Wt Readings from Last 3 Encounters:  10/08/24 174 lb (78.9 kg)  07/05/24 165 lb (74.8 kg)  05/10/24 168 lb (76.2 kg)   HYPERTENSION: Sheri Murphy presents for the medical management of hypertension.  Patient's current hypertension medication regimen is: Bisoprolol -hydrochlorothiazide  10-6.25mg  , losartan  25mg  Patient is  currently taking prescribed medications for HTN. Reports she has had increase in weight and has increased  salt intake recently.  Patient is not regularly keeping a check on BP at home.  Adhering to low sodium diet: No Denies headache, dizziness, CP, SHOB, vision changes.   BP Readings from Last 3 Encounters:  10/08/24 (!) 140/92  05/10/24 130/80  03/15/24 136/84    ANXIETY: Sheri Murphy presents for the medical management of anxiety. She was switched to Wellbutrin  for worsening anxiety and depression earlier in the year after not tolerating fluoxetine . She states she felt the Wellbutrin  was making her feel more anxious and jittery so she slowly weaned herself from it and restarted her Fluoxetine  10mg . She is now taking her Fluoxetine  10mg  daily and states anxiety has improved for the past several weeks.   She states she is having difficulty with sleeping. She states she has had insomnia for several years. She states she has difficulty  falling and staying asleep. She believes she does snore. She does have CPAP for mild sleep apnea per pulmonology and has not used it recently due to improvement in weight. She is having some daytime somnolence. Denies headaches.   She states she does wake up at times due to throat irritation.  Current medication regimen: Fluoxetine  10mg  Well controlled: Yes, currently.  Denies SI/HI.     10/08/2024   11:06 AM 07/05/2024    8:08 AM 05/10/2024    8:45 AM 03/15/2024    2:20 PM  GAD 7 : Generalized Anxiety Score  Nervous, Anxious, on Edge 1 1 1  0  Control/stop worrying 1 0 1 0  Worry too  much - different things 1 0 0 0  Trouble relaxing 1 1 1  0  Restless 0 0 0 0  Easily annoyed or irritable 1 1 1  0  Afraid - awful might happen 0 0 1 0  Total GAD 7 Score 5 3 5  0  Anxiety Difficulty Somewhat difficult Somewhat difficult Not difficult at all Not difficult at all      10/08/2024   11:05 AM 07/05/2024    8:07 AM 05/10/2024    8:44 AM 03/15/2024    2:19 PM  Depression screen PHQ 2/9  Decreased Interest 1 1 1  0  Down, Depressed, Hopeless 0 0 1 0  PHQ - 2 Score 1 1  2  0  Altered sleeping 2 0 1 0  Tired, decreased energy 1 1 1  0  Change in appetite 1 1 0 0  Feeling bad or failure about yourself  0 1 1 0  Trouble concentrating 0 0 0 0  Moving slowly or fidgety/restless 0 1 0 0  Suicidal thoughts 0 0 0 0  PHQ-9 Score 5 5 5  0  Difficult doing work/chores Not difficult at all Not difficult at all Not difficult at all Not difficult at all       The following portions of the patient's history were reviewed and updated as appropriate: past medical history, past surgical history, family history, social history, allergies, medications, and problem list.   Patient Active Problem List   Diagnosis Date Noted   Abnormal mammogram of right breast 12/25/2023   OSA on CPAP 10/04/2020   Lumbar radiculopathy 12/03/2018   Chronic left-sided thoracic back pain 05/15/2018   Constipation 04/14/2018   Gastroesophageal reflux disease without esophagitis 04/14/2018   Fibroid uterus 03/30/2018   Hypertension 03/30/2018   Allergic rhinitis with postnasal drip 09/30/2017   Mild episode of recurrent major depressive disorder 09/30/2017   History of renal cell carcinoma 08/18/2017   Anemia 01/01/2017   Hyperlipidemia associated with type 2 diabetes mellitus (HCC) 08/27/2016   Chronic tension-type headache, not intractable 08/27/2016   Low vitamin D  level 08/27/2016   Controlled type 2 diabetes mellitus without complication, without long-term current use of insulin  (HCC) 02/02/2016   Anxiety and depression 01/08/2016   Benign essential HTN 01/08/2016   Uterus, adenomyosis 12/14/2013   Insomnia 08/13/2011   Obesity 08/09/2011   Past Medical History:  Diagnosis Date   Abnormal liver function 01/08/2016   Anemia    Anxiety    Constipation 11/28/2021   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  Continue Zyrtec, add Singulair nightly for 1 month, recheck in 1 month.  She is on lisinopril but is pretty sure symptoms started prior to starting lisinopril and is on a very low dose for renal protection.  No impr   Dysuria 08/09/2011   Fatty liver    hx of elevated liver enzymes in past   Fibroids    Fibromyalgia    GERD (gastroesophageal reflux disease)    Headache 08/13/2011   IMO Update for Oct 2023 made by user on 10.3.23     Heart murmur    mild no cardiologist   Hematuria    History of COVID-19 04/2021   fever cough  and cold symptoms, took oral antivirals, all symptoms resolved   Hypertension    IBS (irritable bowel syndrome)    Iliotibial band syndrome affecting left lower leg 02/02/2016   Insomnia    Menorrhagia    Obesity    Pelvic pain 11/28/2021   recurrent   Postoperative state 12/05/2021   Renal cell carcinoma, left (HCC) 2018   surgery only chemo/radiation not required, small spot removed   Urinary incontinence 11/28/2021   weras small pad   Urinary urgency    Vitamin D  deficiency    Wears glasses    Past Surgical History:  Procedure Laterality Date   BREAST BIOPSY Right 2021   benign   COLONOSCOPY  2021   DILATATION & CURETTAGE/HYSTEROSCOPY WITH MYOSURE N/A 05/13/2018   Procedure: DILATATION & CURETTAGE/HYSTEROSCOPY WITH NOVASURE;  Surgeon: Lavoie, Marie-Lyne, MD;  Location: [facility];  Service: Gynecology;  Laterality: N/A;   Left renal mass removal  07/2017   Thermal Microwave Ablation   ROBOTIC ASSISTED TOTAL HYSTERECTOMY Bilateral 12/05/2021   Procedure: XI ROBOTIC ASSISTED TOTAL HYSTERECTOMY WITH BILATERAL SALPINGECTOMY;  Surgeon: Lavoie, Marie-Lyne, MD;  Location: [facility];  Service: Gynecology;  Laterality: Bilateral;   SKIN LESION EXCISION  2012   basal cell removed no problems since, sees dermatology prn now    uterine ablation  2018   WISDOM TOOTH EXTRACTION     yrs ago per pt on 11-28-2021   Family History  Problem Relation Age of Onset   Hypertension Mother    Colon cancer Mother 30   Hypertension Father    Hypertension Maternal Aunt    Cancer Maternal Grandmother        small intestine    Hypertension Maternal Grandmother    Pancreatic cancer Maternal Grandfather    Diabetes Paternal Grandmother    BRCA 1/2 Neg Hx    Breast cancer Neg Hx    Outpatient Medications Prior to Visit  Medication Sig Dispense Refill   bisoprolol -hydrochlorothiazide  (ZIAC ) 10-6.25 MG tablet Take 1 tablet by mouth daily. 90 tablet 3   Continuous Glucose Sensor (DEXCOM G7 SENSOR) MISC Apply 1 sensor to skin every 10 days 3 each 12   Dulaglutide  0.75 MG/0.5ML SOAJ Inject 0.75 mg into the skin once a week. 6 mL 3   loratadine  (CLARITIN ) 10 MG tablet Take 1 tablet (10 mg total) by mouth daily. 90 tablet 3   losartan  (COZAAR ) 25 MG tablet Take 1 tablet (25 mg total) by mouth daily. 90 tablet 3   meloxicam  (MOBIC ) 15 MG tablet Take 1 tablet (15 mg total) by mouth daily. 30 tablet 0   omeprazole  (PRILOSEC) 40 MG capsule Take 1 capsule (40 mg total) by mouth daily. 90 capsule 3   rosuvastatin  (CRESTOR ) 5 MG tablet Take 1 tablet (5 mg total) by mouth daily. 90 tablet 3   triamcinolone  cream (KENALOG ) 0.1 % Apply topically 2 (two) times daily as needed. 30 g 2   FLUoxetine  (PROZAC ) 10 MG capsule Take 1 capsule (10 mg total) by mouth daily. 30 capsule 3   hydrOXYzine  (ATARAX ) 25 MG tablet TAKE 1 TABLET BY MOUTH EVERY DAY AT BEDTIME AS NEEDED FOR ANXIETY 90 tablet 2   buPROPion  (WELLBUTRIN  XL) 300 MG 24 hr tablet Take 1 tablet (300 mg total) by mouth daily. (Patient not taking: Reported on 10/08/2024) 90 tablet 3   No facility-administered medications prior to visit.   Allergies  Allergen Reactions   Amitriptyline Other (See Comments)    nightmares  Chlordiazepoxide Other (See Comments)    nightmares   Macrodantin   [Nitrofurantoin ] Hives     ROS: A complete ROS was performed with pertinent positives/negatives noted in the HPI. The remainder of the ROS are negative.    Objective:   Today's Vitals   10/08/24 1101 10/08/24 1130  BP: (!) 141/84 (!) 140/92  Pulse: 73   SpO2: 99%   Weight: 174 lb (78.9 kg)   Height: 5' 4.75 (1.645 m)     Physical Exam   GENERAL: Well-appearing, in NAD. Well nourished.  SKIN: Pink, warm and dry.  Head: Normocephalic. NECK: Trachea midline. Full ROM w/o pain or tenderness.  RESPIRATORY: Chest wall symmetrical. Respirations even and non-labored.  MSK: Muscle tone and strength appropriate for age.  EXTREMITIES: Without clubbing, cyanosis, or edema.  NEUROLOGIC: No motor or sensory deficits. Steady, even gait. C2-C12 intact.  PSYCH/MENTAL STATUS: Alert, oriented x 3. Cooperative, appropriate mood and affect.   Diabetic Foot Exam - Simple   Simple Foot Form Diabetic Foot exam was performed with the following findings: Yes 10/08/2024 11:48 AM  Visual Inspection No deformities, no ulcerations, no other skin breakdown bilaterally: Yes Sensation Testing Intact to touch and monofilament testing bilaterally: Yes Pulse Check Posterior Tibialis and Dorsalis pulse intact bilaterally: Yes Comments      Health Maintenance Due  Topic Date Due   Pneumococcal Vaccine: 50+ Years (1 of 2 - PCV) Never done   Zoster Vaccines- Shingrix (1 of 2) Never done   HEMOGLOBIN A1C  04/08/2024   Colonoscopy  07/08/2024   OPHTHALMOLOGY EXAM  09/07/2024    Results for orders placed or performed in visit on 10/08/24  POCT UA - Microalbumin  Result Value Ref Range   Microalbumin Ur, POC 30 mg/L   Creatinine, POC 100 mg/dL   Albumin/Creatinine Ratio, Urine, POC <30        Assessment & Plan:  1. Anxiety and depression Improved and stable. Will continue on Prozac  10mg  daily. Safety plan reviewed. Refill sent.  - FLUoxetine  (PROZAC ) 10 MG capsule; Take 1 capsule (10 mg total)  by mouth daily.  Dispense: 90 capsule; Refill: 3  2. Controlled type 2 diabetes mellitus without complication, without long-term current use of insulin  (HCC) (Primary) Controlled previously. Will check A1C with labs today. Urine albumin and foot exam completed in office today. Pt doing well with Dexcom monitoring. Discussed good diet changes and exercise.  - Hemoglobin A1c - POCT UA - Microalbumin  3. Benign essential HTN Elevated currently. Stressed decrease of salt intake, increased hydration, and exercise. OSA may also be contributing and discussed below. Will return in 3-4 weeks for BP check and will titrate medication if needed. Continue current regimen. CMP drawn today.  - Comprehensive metabolic panel with GFR  4. Hyperlipidemia associated with type 2 diabetes mellitus (HCC) Stable. Repeat LP with fasting labs today. Discussed use of CoQ10 if needed for cramping and switch from Crestor  if not toleratd. Pt declined today.  - Lipid panel  5. OSA on CPAP Pt will restart CPAP nightly. Discussed OSA increasing with recent weight gain. Will start diet, exercise and use of CPAP nightly.   6. Screening for colon cancer - Ambulatory referral to Gastroenterology  7. Primary Insomnia Pt previously tolerated Lunesta well. Will start Lunesta 1mg  as needed and restart use of CPAP. Safe use reviewed and PDMP reviewed by PCP. Will follow up if no improvement.    Meds ordered this encounter  Medications   FLUoxetine  (PROZAC ) 10 MG capsule  Sig: Take 1 capsule (10 mg total) by mouth daily.    Dispense:  90 capsule    Refill:  3    Supervising Provider:   DE PERU, RAYMOND J [[id number]]   eszopiclone (LUNESTA) 1 MG TABS tablet    Sig: Take 1 tablet (1 mg total) by mouth at bedtime as needed for sleep. Take immediately before bedtime.    Dispense:  30 tablet    Refill:  2    Supervising Provider:   DE PERU, RAYMOND J [[id number]]   Lab Orders         Comprehensive metabolic panel with GFR          Lipid panel         Hemoglobin A1c         POCT UA - Microalbumin      Return for 3-4 weeks BP check only; 6-7 months AE,  (after 5/12 for AE).    Patient to reach out to office if new, worrisome, or unresolved symptoms arise or if no improvement in patient's condition. Patient verbalized understanding and is agreeable to treatment plan. All questions answered to patient's satisfaction.    Sheri Murphy, [place]

## 2024-10-08 NOTE — Patient Instructions (Addendum)
 CoQ10 - for muscle aches    Monitor Salt, increase clear fluids and restart CPAP for BP elevation.  Return in 3-4 weeks for BP Check   Schedule Eye appt

## 2024-10-09 LAB — COMPREHENSIVE METABOLIC PANEL WITH GFR
ALT: 28 IU/L (ref 0–32)
AST: 25 IU/L (ref 0–40)
Albumin: 4.7 g/dL (ref 3.8–4.9)
Alkaline Phosphatase: 57 IU/L (ref 49–135)
BUN/Creatinine Ratio: 16 (ref 9–23)
BUN: 14 mg/dL (ref 6–24)
Bilirubin Total: 0.3 mg/dL (ref 0.0–1.2)
CO2: 23 mmol/L (ref 20–29)
Calcium: 10 mg/dL (ref 8.7–10.2)
Chloride: 101 mmol/L (ref 96–106)
Creatinine, Ser: 0.85 mg/dL (ref 0.57–1.00)
Globulin, Total: 2.9 g/dL (ref 1.5–4.5)
Glucose: 112 mg/dL — ABNORMAL HIGH (ref 70–99)
Potassium: 4.2 mmol/L (ref 3.5–5.2)
Sodium: 139 mmol/L (ref 134–144)
Total Protein: 7.6 g/dL (ref 6.0–8.5)
eGFR: 80 mL/min/1.73 (ref >59)

## 2024-10-09 LAB — LIPID PANEL
Chol/HDL Ratio: 3.6 ratio (ref 0.0–4.4)
Cholesterol, Total: 145 mg/dL (ref 100–199)
HDL: 40 mg/dL (ref >39)
LDL Chol Calc (NIH): 70 mg/dL (ref 0–99)
Triglycerides: 215 mg/dL — ABNORMAL HIGH (ref 0–149)
VLDL Cholesterol Cal: 35 mg/dL (ref 5–40)

## 2024-10-09 LAB — HEMOGLOBIN A1C
Est. average glucose Bld gHb Est-mCnc: 134 mg/dL
Hgb A1c MFr Bld: 6.3 % — ABNORMAL HIGH (ref 4.8–5.6)

## 2024-10-11 ENCOUNTER — Ambulatory Visit (HOSPITAL_BASED_OUTPATIENT_CLINIC_OR_DEPARTMENT_OTHER): Payer: Self-pay | Admitting: Family Medicine

## 2024-10-11 NOTE — Progress Notes (Signed)
 Hi Ayerim,  Your electrolytes, kidney and liver function is stable. Your A1C has improved greatly from 7.8 down to 6.3 which indicates great control of your diabetes. Your triglycerides were slightly elevated and may be due to diet. An omega 3 fish oil supplement can help this daily as well as heart healthy diet. Keep up the good work.

## 2024-10-15 ENCOUNTER — Ambulatory Visit
Admission: EM | Admit: 2024-10-15 | Discharge: 2024-10-15 | Disposition: A | Discharge status: Home / Self Care | Attending: Family Medicine | Admitting: Family Medicine

## 2024-10-15 DIAGNOSIS — S61002A Unspecified open wound of left thumb without damage to nail, initial encounter: Secondary | ICD-10-CM | POA: Diagnosis not present

## 2024-10-15 DIAGNOSIS — S61102A Unspecified open wound of left thumb with damage to nail, initial encounter: Secondary | ICD-10-CM

## 2024-10-15 NOTE — ED Provider Notes (Signed)
 [facility]    CSN: [id number] Arrival date & time: 10/15/24  1434      History   Chief Complaint Chief Complaint  Patient presents with   Laceration    HPI Sheri Murphy is a 58 y.o. female presents for laceration.  Patient reports earlier today she was chopping some vegetables when she cut her distal left thumb.  She states she tried to clean it but it was bleeding a lot prompting her to come in.  She is up-to-date on her tetanus.  Does not take any blood thinning medications.  No numbness tingling.  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  Continue Zyrtec, add Singulair nightly for 1 month, recheck in 1 month.  She is on lisinopril but is pretty sure symptoms started prior to starting lisinopril and is on a very low dose for renal protection.  No impr   Dysuria 08/09/2011   Fatty liver    hx of elevated liver enzymes in past   Fibroids    Fibromyalgia    GERD (gastroesophageal reflux disease)    Headache 08/13/2011   IMO Update for Oct 2023 made by user on 10.3.23     Heart murmur    mild no cardiologist   Hematuria    History of COVID-19 04/2021   fever cough and cold symptoms, took oral antivirals, all symptoms resolved   Hypertension    IBS (irritable bowel syndrome)    Iliotibial band syndrome affecting left lower leg 02/02/2016   Insomnia    Menorrhagia    Obesity    Pelvic pain 11/28/2021   recurrent   Postoperative state 12/05/2021   Renal cell carcinoma, left (HCC) 2018   surgery only chemo/radiation  not required, small spot removed   Urinary incontinence 11/28/2021   weras small pad   Urinary urgency    Vitamin D  deficiency    Wears glasses     Patient Active Problem List   Diagnosis Date Noted   Abnormal mammogram of right breast 12/25/2023   OSA on CPAP 10/04/2020   Lumbar radiculopathy 12/03/2018   Chronic left-sided thoracic back pain 05/15/2018   Constipation 04/14/2018   Gastroesophageal reflux disease without esophagitis 04/14/2018   Fibroid uterus 03/30/2018   Hypertension 03/30/2018   Allergic rhinitis with postnasal drip 09/30/2017   Mild episode of recurrent major depressive disorder 09/30/2017   History of renal cell carcinoma 08/18/2017   Anemia 01/01/2017   Hyperlipidemia associated with type 2 diabetes mellitus (HCC) 08/27/2016   Chronic tension-type headache, not intractable 08/27/2016   Low vitamin D  level 08/27/2016   Controlled type 2 diabetes mellitus without complication, without long-term current use of insulin  (HCC) 02/02/2016   Anxiety and depression 01/08/2016   Benign essential HTN 01/08/2016   Uterus, adenomyosis 12/14/2013   Insomnia 08/13/2011   Obesity 08/09/2011    Past Surgical History:  Procedure Laterality Date   BREAST BIOPSY Right 2021   benign   COLONOSCOPY  2021   DILATATION &  CURETTAGE/HYSTEROSCOPY WITH MYOSURE N/A 05/13/2018   Procedure: DILATATION & CURETTAGE/HYSTEROSCOPY WITH NOVASURE;  Surgeon: Lavoie, Marie-Lyne, MD;  Location: [facility];  Service: Gynecology;  Laterality: N/A;   Left renal mass removal  07/2017   Thermal Microwave Ablation   ROBOTIC ASSISTED TOTAL HYSTERECTOMY Bilateral 12/05/2021   Procedure: XI ROBOTIC ASSISTED TOTAL HYSTERECTOMY WITH BILATERAL SALPINGECTOMY;  Surgeon: Lavoie, Marie-Lyne, MD;  Location: [facility];  Service: Gynecology;  Laterality: Bilateral;   SKIN LESION EXCISION  2012   basal cell removed no problems since, sees dermatology prn now   uterine ablation   2018   WISDOM TOOTH EXTRACTION     yrs ago per pt on 11-28-2021    OB History     Gravida  3   Para  0   Term      Preterm      AB  3   Living  0      SAB  1   IAB  2   Ectopic      Multiple      Live Births               Home Medications    Prior to Admission medications   Medication Sig Start Date End Date Taking? Authorizing Provider  bisoprolol -hydrochlorothiazide  (ZIAC ) 10-6.25 MG tablet Take 1 tablet by mouth daily. 05/10/24   Caudle, Thersia Bitters, FNP  Continuous Glucose Sensor (DEXCOM G7 SENSOR) MISC Apply 1 sensor to skin every 10 days 03/15/24   Caudle, Thersia Bitters, FNP  Dulaglutide  0.75 MG/0.5ML SOAJ Inject 0.75 mg into the skin once a week. 05/10/24   Caudle, Thersia Bitters, FNP  eszopiclone (LUNESTA) 1 MG TABS tablet Take 1 tablet (1 mg total) by mouth at bedtime as needed for sleep. Take immediately before bedtime. 10/08/24   Caudle, Thersia Bitters, FNP  FLUoxetine  (PROZAC ) 10 MG capsule Take 1 capsule (10 mg total) by mouth daily. 10/08/24   Caudle, Thersia Bitters, FNP  loratadine  (CLARITIN ) 10 MG tablet Take 1 tablet (10 mg total) by mouth daily. 05/10/24   Caudle, Thersia Bitters, FNP  losartan  (COZAAR ) 25 MG tablet Take 1 tablet (25 mg total) by mouth daily. 05/10/24   Caudle, Thersia Bitters, FNP  meloxicam  (MOBIC ) 15 MG tablet Take 1 tablet (15 mg total) by mouth daily. 09/09/24   Joya Stabs, DPM  omeprazole  (PRILOSEC) 40 MG capsule Take 1 capsule (40 mg total) by mouth daily. 05/10/24   Caudle, Thersia Bitters, FNP  rosuvastatin  (CRESTOR ) 5 MG tablet Take 1 tablet (5 mg total) by mouth daily. 08/18/24   Caudle, Thersia Bitters, FNP  triamcinolone  cream (KENALOG ) 0.1 % Apply topically 2 (two) times daily as needed. 05/10/24   Caudle, Thersia Bitters, FNP    Family History Family History  Problem Relation Age of Onset   Hypertension Mother    Colon cancer Mother 18   Hypertension Father    Hypertension Maternal Aunt    Cancer Maternal Grandmother         small intestine    Hypertension Maternal Grandmother    Pancreatic cancer Maternal Grandfather    Diabetes Paternal Grandmother    BRCA 1/2 Neg Hx    Breast cancer Neg Hx     Social History Social History   Tobacco Use   Smoking status: Never   Smokeless tobacco: Never  Vaping Use   Vaping status: Never Used  Substance Use Topics   Alcohol use: Yes    Comment: periodically   Drug  use: No     Allergies   Amitriptyline, Chlordiazepoxide, and Macrodantin  [nitrofurantoin ]   Review of Systems Review of Systems  Skin:  Positive for wound.     Physical Exam Triage Vital Signs ED Triage Vitals  Encounter Vitals Group     BP 10/15/24 1450 (!) 156/92     Girls Systolic BP Percentile --      Girls Diastolic BP Percentile --      Boys Systolic BP Percentile --      Boys Diastolic BP Percentile --      Pulse Rate 10/15/24 1450 88     Resp 10/15/24 1450 15     Temp --      Temp src --      SpO2 10/15/24 1450 97 %     Weight --      Height --      Head Circumference --      Peak Flow --      Pain Score 10/15/24 1449 2     Pain Loc --      Pain Education --      Exclude from Growth Chart --    No data found.  Updated Vital Signs BP (!) 156/92   Pulse 88   Resp 15   LMP  (LMP Unknown) Comment: no menustral cycle since 2018 uterine ablation  SpO2 97%   Visual Acuity Right Eye Distance:   Left Eye Distance:   Bilateral Distance:    Right Eye Near:   Left Eye Near:    Bilateral Near:     Physical Exam Vitals and nursing note reviewed.  Constitutional:      General: She is not in acute distress.    Appearance: Normal appearance. She is not ill-appearing.  HENT:     Head: Normocephalic and atraumatic.  Eyes:     Pupils: Pupils are equal, round, and reactive to light.  Cardiovascular:     Rate and Rhythm: Normal rate.  Pulmonary:     Effort: Pulmonary effort is normal.  Musculoskeletal:       Hands:     Comments: There is a very small superficial  skin avulsion to the distal medial aspect of the left thumb with partial nail avulsion as well.  Bleeding very minimal and controlled with pressure.  Cap refill +2.  Nail is otherwise intact and adhered.  Skin:    General: Skin is warm and dry.  Neurological:     General: No focal deficit present.     Mental Status: She is alert and oriented to person, place, and time.  Psychiatric:        Mood and Affect: Mood normal.        Behavior: Behavior normal.      UC Treatments / Results  Labs (all labs ordered are listed, but only abnormal results are displayed) Labs Reviewed - No data to display  EKG   Radiology No results found.  Procedures Procedures (including critical care time)  Medications Ordered in UC Medications - No data to display  Initial Impression / Assessment and Plan / UC Course  I have reviewed the triage vital signs and the nursing notes.  Pertinent labs & imaging results that were available during my care of the patient were reviewed by me and considered in my medical decision making (see chart for details).     Reviewed exam and symptoms with patient.  No red flags.  Discussed skin avulsion/wound care.  Wound was cleansed and  pressure dressing applied by nursing staff.  Patient to keep it in place for 24 hours after that may change daily and this becomes a little saturated.  Discussed signs and symptoms of infection/return precautions.  PCP follow-up as needed and ER precautions reviewed. Final Clinical Impressions(s) / UC Diagnoses   Final diagnoses:  Avulsion of skin of left thumb, initial encounter  Avulsion of nail of left thumb     Discharge Instructions      Keep the area clean and dry.  Keep the dressing on for 24 hours after that you may change it daily unless it becomes wet or saturated.  Monitor for any signs of infection such as redness, drainage, swelling, fevers or chills and seek reevaluation if these occur.  Follow-up with your PCP as  needed.    ED Prescriptions   None    PDMP not reviewed this encounter.   Loreda Myla SAUNDERS, NP 10/15/24 [phone]

## 2024-10-15 NOTE — Discharge Instructions (Addendum)
 Keep the area clean and dry.  Keep the dressing on for 24 hours after that you may change it daily unless it becomes wet or saturated.  Monitor for any signs of infection such as redness, drainage, swelling, fevers or chills and seek reevaluation if these occur.  Follow-up with your PCP as needed.

## 2024-10-15 NOTE — ED Triage Notes (Addendum)
 Pt present with a laceration to the lt thumb. Pt states she was chopping vegetable with a knife and slid it across her thumb.  Pt states she is up to date with tdap

## 2024-10-21 ENCOUNTER — Other Ambulatory Visit: Payer: Self-pay | Admitting: Medical Genetics

## 2024-10-21 ENCOUNTER — Ambulatory Visit: Admitting: Podiatry

## 2024-10-21 DIAGNOSIS — Z006 Encounter for examination for normal comparison and control in clinical research program: Secondary | ICD-10-CM

## 2024-10-23 ENCOUNTER — Encounter (HOSPITAL_BASED_OUTPATIENT_CLINIC_OR_DEPARTMENT_OTHER): Payer: Self-pay | Admitting: Family Medicine

## 2024-10-25 ENCOUNTER — Encounter (HOSPITAL_BASED_OUTPATIENT_CLINIC_OR_DEPARTMENT_OTHER): Payer: Self-pay | Admitting: Family Medicine

## 2024-10-25 ENCOUNTER — Encounter: Payer: Self-pay | Admitting: Family Medicine

## 2024-10-25 ENCOUNTER — Ambulatory Visit: Admitting: Family Medicine

## 2024-10-25 ENCOUNTER — Other Ambulatory Visit (HOSPITAL_BASED_OUTPATIENT_CLINIC_OR_DEPARTMENT_OTHER): Payer: Self-pay | Admitting: Family Medicine

## 2024-10-25 ENCOUNTER — Ambulatory Visit: Payer: Self-pay

## 2024-10-25 ENCOUNTER — Ambulatory Visit: Admitting: Obstetrics and Gynecology

## 2024-10-25 ENCOUNTER — Other Ambulatory Visit (HOSPITAL_BASED_OUTPATIENT_CLINIC_OR_DEPARTMENT_OTHER): Payer: Self-pay

## 2024-10-25 VITALS — BP 144/88 | HR 72 | Temp 98.3°F | Wt 174.3 lb

## 2024-10-25 DIAGNOSIS — H6591 Unspecified nonsuppurative otitis media, right ear: Secondary | ICD-10-CM | POA: Diagnosis not present

## 2024-10-25 MED ORDER — LOSARTAN POTASSIUM 50 MG PO TABS
50.0000 mg | ORAL_TABLET | Freq: Every day | ORAL | 3 refills | Status: AC
  Filled 2024-10-25 – 2024-10-27 (×3): qty 30, 30d supply, fill #0
  Filled 2024-12-04: qty 30, 30d supply, fill #1
  Filled 2025-01-21: qty 30, 30d supply, fill #2
  Filled 2025-01-22: qty 30, 30d supply, fill #0

## 2024-10-25 NOTE — Telephone Encounter (Signed)
 Please see mychart message sent by pt and advise.

## 2024-10-25 NOTE — Progress Notes (Signed)
 Established Patient Office Visit  Subjective   Patient ID: Sheri Murphy, female    DOB: August 08, 1966  Age: 58 y.o. MRN: [id number]  Chief Complaint  Patient presents with   Ear Pain   Fatigue         HPI   Sheri Murphy is seen as a work in with recent cold-like symptoms about 2 weeks ago.  9 days ago on Saturday at home she did COVID test which came back negative.  She feels like she had some low-grade fever initially.  By last Wednesday her symptoms were worse but she has gradually had some improvement since then.  Her main issue now is right ear pressure greater than left.  No acute hearing changes.  She did take some over-the-counter decongestants few days ago.  She has hypertension treated with losartan  and bisoprolol  HCTZ.  She is working with her primary to get that better control.  Denies any fever.  Had some colored nasal discharge last week but clear past couple days.  Occasional headaches.  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  Continue Zyrtec, add Singulair nightly for 1 month, recheck in 1 month.  She is on lisinopril but is pretty sure symptoms started prior to starting lisinopril and is on a very low dose for renal protection.  No impr   Dysuria 08/09/2011   Fatty liver    hx of elevated liver enzymes in past   Fibroids    Fibromyalgia    GERD (gastroesophageal reflux disease)    Headache 08/13/2011   IMO Update for Oct 2023 made by user on 10.3.23     Heart murmur    mild no cardiologist   Hematuria    History of COVID-19 04/2021   fever cough and cold symptoms, took  oral antivirals, all symptoms resolved   Hypertension    IBS (irritable bowel syndrome)    Iliotibial band syndrome affecting left lower leg 02/02/2016   Insomnia    Menorrhagia    Obesity    Pelvic pain 11/28/2021   recurrent   Postoperative state 12/05/2021   Renal cell carcinoma, left (HCC) 2018   surgery only chemo/radiation not required, small spot removed   Urinary incontinence 11/28/2021   weras small pad   Urinary urgency    Vitamin D  deficiency    Wears glasses    Past Surgical History:  Procedure Laterality Date   BREAST BIOPSY Right 2021   benign   COLONOSCOPY  2021   DILATATION & CURETTAGE/HYSTEROSCOPY WITH MYOSURE N/A 05/13/2018   Procedure: DILATATION & CURETTAGE/HYSTEROSCOPY WITH NOVASURE;  Surgeon: Lavoie, Marie-Lyne, MD;  Location: [facility];  Service: Gynecology;  Laterality: N/A;   Left renal mass removal  07/2017   Thermal Microwave Ablation   ROBOTIC ASSISTED TOTAL HYSTERECTOMY Bilateral 12/05/2021   Procedure: XI ROBOTIC ASSISTED TOTAL HYSTERECTOMY WITH BILATERAL SALPINGECTOMY;  Surgeon: Lavoie, Marie-Lyne, MD;  Location: [facility];  Service: Gynecology;  Laterality: Bilateral;   SKIN LESION EXCISION  2012   basal cell removed no problems since, sees dermatology  prn now   uterine ablation  2018   WISDOM TOOTH EXTRACTION     yrs ago per pt on 11-28-2021    reports that she has never smoked. She has never used smokeless tobacco. She reports current alcohol use. She reports that she does not use drugs. family history includes Cancer in her maternal grandmother; Colon cancer (age of onset: 34) in her mother; Diabetes in her paternal grandmother; Hypertension in her father, maternal aunt, maternal grandmother, and mother; Pancreatic cancer in her maternal grandfather. Allergies  Allergen Reactions   Amitriptyline Other (See Comments)    nightmares   Chlordiazepoxide Other (See Comments)    nightmares   Macrodantin   [Nitrofurantoin ] Hives    Review of Systems  Constitutional:  Negative for chills and fever.  HENT:  Positive for congestion and ear pain. Negative for hearing loss.       Objective:     BP (!) 144/88 (BP Location: Left Arm, Cuff Size: Normal)   Pulse 72   Temp 98.3 F (36.8 C) (Oral)   Wt 174 lb 4.8 oz (79.1 kg)   LMP  (LMP Unknown) Comment: no menustral cycle since 2018 uterine ablation  SpO2 96%   BMI 29.23 kg/m  BP Readings from Last 3 Encounters:  10/25/24 (!) 144/88  10/15/24 (!) 156/92  10/08/24 (!) 140/92   Wt Readings from Last 3 Encounters:  10/25/24 174 lb 4.8 oz (79.1 kg)  10/08/24 174 lb (78.9 kg)  07/05/24 165 lb (74.8 kg)      Physical Exam Vitals reviewed.  Constitutional:      General: She is not in acute distress.    Appearance: She is not ill-appearing.  HENT:     Ears:     Comments: Right ear reveals serous effusion along the inferior portion.  No erythema.  No suppurative changes.  Left TM is normal.    Mouth/Throat:     Mouth: Mucous membranes are moist.     Pharynx: Oropharynx is clear. No oropharyngeal exudate or posterior oropharyngeal erythema.  Cardiovascular:     Rate and Rhythm: Normal rate and regular rhythm.  Pulmonary:     Effort: Pulmonary effort is normal.     Breath sounds: Normal breath sounds. No wheezing or rales.  Neurological:     Mental Status: She is alert.      No results found for any visits on 10/25/24.    The 10-year ASCVD risk score (Arnett DK, et al., 2019) is: 17.4%    Assessment & Plan:   Right otitis media with effusion following recent viral URI.  No suppurative changes.  Recommend reassurance at this point and observation.  We explained these are usually self-limited but follow-up with primary if symptoms not clearing in the next few weeks.  No clear indication for antibiotics at this time.  Patient aware that her blood pressure was up slightly today and she plans to follow-up with primary soon  regarding recheck.  Blood pressure did come down some after rest 144/88  Wolm Scarlet, MD

## 2024-10-25 NOTE — Telephone Encounter (Signed)
 FYI Only or Action Required?: FYI only for provider.  Patient was last seen in primary care on 10/08/2024 by Knute Thersia Bitters, FNP.  Called Nurse Triage reporting Otalgia and Headache.  Symptoms began a week ago.  Interventions attempted: Rest, hydration, or home remedies.  Symptoms are: unchanged.  Triage Disposition: See Physician Within 24 Hours  Patient/caregiver understands and will follow disposition?: Yes   Copied from CRM [id number]. Topic: Clinical - Red Word Triage >> Oct 25, 2024 10:02 AM Richerd A wrote: [facility] that prompted transfer to Nurse Triage: Patient has ear aches and headaches due to cold.  Has been ongoing for two weeks Reason for Disposition  Earache  Answer Assessment - Initial Assessment Questions 1. WORST SYMPTOM: What is your worst symptom? (e.g., cough, runny nose, muscle aches, headache, sore throat, fever)      Ear pain, headache  2. ONSET: When did your flu symptoms start?      2 weeks ago  3. COUGH: How bad is the cough?       Improving  4. RESPIRATORY DISTRESS: Describe your breathing.      Breathing normally  5. FEVER: Do you have a fever? If Yes, ask: What is your temperature, how was it measured, and when did it start?     denies 6. EXPOSURE: Were you exposed to someone with influenza?       unsure 7. FLU VACCINE: Did you get a flu shot this year?      8. HIGH RISK DISEASE: Do you have any chronic medical problems? (e.g., heart or lung disease, asthma, weak immune system, or other HIGH RISK conditions)      9. PREGNANCY: Is there any chance you are pregnant? When was your last menstrual period?      10. OTHER SYMPTOMS: Do you have any other symptoms?  (e.g., runny nose, muscle aches, headache, sore throat)       Fever for 3 days has resolved. Sinus congested. Sinus pain.  Protocols used: Influenza (Flu) - [facility]

## 2024-10-25 NOTE — Telephone Encounter (Signed)
 Please see mychart message sent by pt and advise.  Pt was seen for acute visit by another family medicine office and OV notes are able to be seen in pt's chart.

## 2024-10-27 ENCOUNTER — Ambulatory Visit (INDEPENDENT_AMBULATORY_CARE_PROVIDER_SITE_OTHER): Admitting: Family Medicine

## 2024-10-27 ENCOUNTER — Encounter (HOSPITAL_BASED_OUTPATIENT_CLINIC_OR_DEPARTMENT_OTHER): Payer: Self-pay | Admitting: Family Medicine

## 2024-10-27 ENCOUNTER — Other Ambulatory Visit (HOSPITAL_BASED_OUTPATIENT_CLINIC_OR_DEPARTMENT_OTHER): Payer: Self-pay

## 2024-10-27 ENCOUNTER — Other Ambulatory Visit: Payer: Self-pay

## 2024-10-27 VITALS — BP 132/84 | HR 77 | Ht 64.75 in | Wt 173.0 lb

## 2024-10-27 DIAGNOSIS — F419 Anxiety disorder, unspecified: Secondary | ICD-10-CM | POA: Diagnosis not present

## 2024-10-27 DIAGNOSIS — H66001 Acute suppurative otitis media without spontaneous rupture of ear drum, right ear: Secondary | ICD-10-CM

## 2024-10-27 DIAGNOSIS — F5101 Primary insomnia: Secondary | ICD-10-CM | POA: Diagnosis not present

## 2024-10-27 DIAGNOSIS — F32A Depression, unspecified: Secondary | ICD-10-CM | POA: Diagnosis not present

## 2024-10-27 MED ORDER — FLUOXETINE HCL 20 MG PO CAPS
20.0000 mg | ORAL_CAPSULE | Freq: Every day | ORAL | 2 refills | Status: DC
  Filled 2024-10-27: qty 90, 90d supply, fill #0

## 2024-10-27 MED ORDER — AMOXICILLIN-POT CLAVULANATE 875-125 MG PO TABS
1.0000 | ORAL_TABLET | Freq: Two times a day (BID) | ORAL | 0 refills | Status: AC
  Filled 2024-10-27: qty 14, 7d supply, fill #0

## 2024-10-27 MED ORDER — ZOLPIDEM TARTRATE ER 6.25 MG PO TBCR
6.2500 mg | EXTENDED_RELEASE_TABLET | Freq: Every evening | ORAL | 2 refills | Status: AC | PRN
  Filled 2024-10-27: qty 30, 30d supply, fill #0
  Filled 2025-01-21: qty 30, 30d supply, fill #1

## 2024-10-27 NOTE — Progress Notes (Signed)
 Subjective:   Sheri Murphy 10/25/1966 10/27/2024  Chief Complaint  Patient presents with   Insomnia    Pt is still having problems sleeping even after taking lunesta. States she feels like she is getting about 4-5 hours of sleep a night. States she knows she wakes up at least once a night but stated she probably wakes up more than that without realizing. After waking up at night, it usually takes her at least an hour before she is able to go back to sleep. Takes the lunesta at least 1 hour prior to laying down at night and also has been using cpap machine which she states helps some.    Discussed the use of AI scribe software for clinical note transcription with the patient, who gave verbal consent to proceed.  History of Present Illness Sheri Murphy is a 58 year old female who presents with ongoing sleep disturbances despite medication adjustments.  She has been experiencing persistent sleep disturbances despite trying various dosages of Lunesta. Initially, she took 1 mg with no effect, increased to 2 mg with minimal improvement, and finally 3 mg, which allowed her to fall asleep around 1 AM without waking during the night. However, she still has difficulty falling asleep initially and has run out of Lunesta due to taking higher doses.  She restarted using her CPAP machine, which she had previously stopped, but continues to experience sleep disturbances. Anxiety, particularly related to work, may be contributing to her sleep issues. She is currently taking Prozac  (fluoxetine ) 10 mg, which initially helped with her anxiety, but she feels the effect has plateaued.  In addition to sleep issues, she has been experiencing symptoms of a cold for the past two weeks, including fever and congestion. Another doctor told her that he could see fluid in her ear but did not prescribe decongestants or antibiotics at that time. The symptoms have improved slightly but persist, and she is concerned about  her upcoming travel plans in two weeks.      The following portions of the patient's history were reviewed and updated as appropriate: past medical history, past surgical history, family history, social history, allergies, medications, and problem list.   Patient Active Problem List   Diagnosis Date Noted   Abnormal mammogram of right breast 12/25/2023   OSA on CPAP 10/04/2020   Lumbar radiculopathy 12/03/2018   Chronic left-sided thoracic back pain 05/15/2018   Constipation 04/14/2018   Gastroesophageal reflux disease without esophagitis 04/14/2018   Fibroid uterus 03/30/2018   Hypertension 03/30/2018   Allergic rhinitis with postnasal drip 09/30/2017   Mild episode of recurrent major depressive disorder 09/30/2017   History of renal cell carcinoma 08/18/2017   Anemia 01/01/2017   Hyperlipidemia associated with type 2 diabetes mellitus (HCC) 08/27/2016   Chronic tension-type headache, not intractable 08/27/2016   Low vitamin D  level 08/27/2016   Controlled type 2 diabetes mellitus without complication, without long-term current use of insulin  (HCC) 02/02/2016   Anxiety and depression 01/08/2016   Benign essential HTN 01/08/2016   Uterus, adenomyosis 12/14/2013   Insomnia 08/13/2011   Obesity 08/09/2011   Past Medical History:  Diagnosis Date   Abnormal liver function 01/08/2016   Anemia    Anxiety    Constipation 11/28/2021   Contracture of right Achilles tendon 12/31/2018   Controlled type 2 diabetes mellitus without complication (HCC)    Cough 04/04/2020   Last Assessment & Plan:    I still feel related to allergies, laryngopharyngeal reflux and  differential  Advised her to have a chest x-ray since she has been coughing over a year now.  Continue Zyrtec, add Singulair nightly for 1 month, recheck in 1 month.  She is on lisinopril but is pretty sure symptoms started prior to starting lisinopril and is on a very low dose for renal protection.  No impr   Dysuria 08/09/2011    Fatty liver    hx of elevated liver enzymes in past   Fibroids    Fibromyalgia    GERD (gastroesophageal reflux disease)    Headache 08/13/2011   IMO Update for Oct 2023 made by user on 10.3.23     Heart murmur    mild no cardiologist   Hematuria    History of COVID-19 04/2021   fever cough and cold symptoms, took oral antivirals, all symptoms resolved   Hypertension    IBS (irritable bowel syndrome)    Iliotibial band syndrome affecting left lower leg 02/02/2016   Insomnia    Menorrhagia    Obesity    Pelvic pain 11/28/2021   recurrent   Postoperative state 12/05/2021   Renal cell carcinoma, left (HCC) 2018   surgery only chemo/radiation not required, small spot removed   Urinary incontinence 11/28/2021   weras small pad   Urinary urgency    Vitamin D  deficiency    Wears glasses    Past Surgical History:  Procedure Laterality Date   BREAST BIOPSY Right 2021   benign   COLONOSCOPY  2021   DILATATION & CURETTAGE/HYSTEROSCOPY WITH MYOSURE N/A 05/13/2018   Procedure: DILATATION & CURETTAGE/HYSTEROSCOPY WITH NOVASURE;  Surgeon: Lavoie, Marie-Lyne, MD;  Location: [facility];  Service: Gynecology;  Laterality: N/A;   Left renal mass removal  07/2017   Thermal Microwave Ablation   ROBOTIC ASSISTED TOTAL HYSTERECTOMY Bilateral 12/05/2021   Procedure: XI ROBOTIC ASSISTED TOTAL HYSTERECTOMY WITH BILATERAL SALPINGECTOMY;  Surgeon: Lavoie, Marie-Lyne, MD;  Location: [facility];  Service: Gynecology;  Laterality: Bilateral;   SKIN LESION EXCISION  2012   basal cell removed no problems since, sees dermatology prn now   uterine ablation  2018   WISDOM TOOTH EXTRACTION     yrs ago per pt on 11-28-2021   Family History  Problem Relation Age of Onset   Hypertension Mother    Colon cancer Mother 4   Hypertension Father    Hypertension Maternal Aunt    Cancer Maternal Grandmother        small intestine    Hypertension Maternal Grandmother     Pancreatic cancer Maternal Grandfather    Diabetes Paternal Grandmother    BRCA 1/2 Neg Hx    Breast cancer Neg Hx    Outpatient Medications Prior to Visit  Medication Sig Dispense Refill   bisoprolol -hydrochlorothiazide  (ZIAC ) 10-6.25 MG tablet Take 1 tablet by mouth daily. 90 tablet 3   Continuous Glucose Sensor (DEXCOM G7 SENSOR) MISC Apply 1 sensor to skin every 10 days 3 each 12   Dulaglutide  0.75 MG/0.5ML SOAJ Inject 0.75 mg into the skin once a week. 6 mL 3   loratadine  (CLARITIN ) 10 MG tablet Take 1 tablet (10 mg total) by mouth daily. 90 tablet 3   losartan  (COZAAR ) 50 MG tablet Take 1 tablet (50 mg total) by mouth daily. 30 tablet 3   meloxicam  (MOBIC ) 15 MG tablet Take 1 tablet (15 mg total) by mouth daily. 30 tablet 0   omeprazole  (PRILOSEC) 40 MG capsule Take 1 capsule (40 mg total) by mouth daily.  90 capsule 3   rosuvastatin  (CRESTOR ) 5 MG tablet Take 1 tablet (5 mg total) by mouth daily. 90 tablet 3   triamcinolone  cream (KENALOG ) 0.1 % Apply topically 2 (two) times daily as needed. 30 g 2   eszopiclone (LUNESTA) 1 MG TABS tablet Take 1 tablet (1 mg total) by mouth at bedtime as needed for sleep. Take immediately before bedtime. 30 tablet 2   FLUoxetine  (PROZAC ) 10 MG capsule Take 1 capsule (10 mg total) by mouth daily. 90 capsule 3   No facility-administered medications prior to visit.   Allergies  Allergen Reactions   Amitriptyline Other (See Comments)    nightmares   Chlordiazepoxide Other (See Comments)    nightmares   Macrodantin  [Nitrofurantoin ] Hives     ROS: A complete ROS was performed with pertinent positives/negatives noted in the HPI. The remainder of the ROS are negative.    Objective:   Today's Vitals   10/27/24 0947  BP: 132/84  Pulse: 77  SpO2: 100%  Weight: 173 lb (78.5 kg)  Height: 5' 4.75 (1.645 m)    Physical Exam   GENERAL: Well-appearing, in NAD. Well nourished.  SKIN: Pink, warm and dry.  Head: Normocephalic. NECK: Trachea  midline. Full ROM w/o pain or tenderness. No lymphadenopathy.  EARS: Right TM is intact red, injected and slightly bulging with mild clear drainage. Tympanic membranes are intact, translucent without bulging and without drainage. Appropriate landmarks visualized.  EYES: Conjunctiva clear without exudates. EOMI, PERRL, no drainage present.  NOSE: Septum midline w/o deformity. Nares patent, mucosa pink and non-inflamed w/o drainage. No sinus tenderness.  THROAT: Uvula midline. Oropharynx clear.  Mucous membranes pink and moist.  RESPIRATORY: Chest wall symmetrical. Respirations even and non-labored.  MSK: Muscle tone and strength appropriate for age.  EXTREMITIES: Without clubbing, cyanosis, or edema.  NEUROLOGIC: No motor or sensory deficits. Steady, even gait. C2-C12 intact.  PSYCH/MENTAL STATUS: Alert, oriented x 3. Cooperative, appropriate mood and affect.       Assessment & Plan:  1. Non-recurrent acute suppurative otitis media of right ear without spontaneous rupture of tympanic membrane (Primary) Start Augmentin BID x 7 days. She can use OTC antihistamine, flonase as needed for congestion. If no improvement in 1 week, reach out to PCP.   2. Primary insomnia Discussed medications and feel shemay benefit from a controlled release of Ambien . She will stop Lunesta and start Ambien  6.25mg  CR. Patient will follow up with PCP in 1-2 weeks if no improvement. Continue CPAP use nightly. Safe use of medication reviewed with patient.   3. Anxiety and depression Uncontrolled. Will increase Fluoxetine  to 20mg  daily. Safety plan reviewed with a patient. She will follow up in 4-6 weeks if no improvement or worsening. We discussed that with improvement of anxiety, she will likely be able to limit Ambien  use to PRN night.    Meds ordered this encounter  Medications   zolpidem  (AMBIEN  CR) 6.25 MG CR tablet    Sig: Take 1 tablet (6.25 mg total) by mouth at bedtime as needed for sleep.    Dispense:  30  tablet    Refill:  2    Supervising Provider:   DE CUBA, RAYMOND J [[id number]]   FLUoxetine  (PROZAC ) 20 MG capsule    Sig: Take 1 capsule (20 mg total) by mouth daily.    Dispense:  90 capsule    Refill:  2    Supervising Provider:   DE CUBA, RAYMOND J [[id number]]   amoxicillin-clavulanate (AUGMENTIN) 875-125 MG tablet  Sig: Take 1 tablet by mouth 2 (two) times daily for 7 days.    Dispense:  14 tablet    Refill:  0    Supervising Provider:   DE CUBA, RAYMOND J [[id number]]   Lab Orders  No laboratory test(s) ordered today   No images are attached to the encounter or orders placed in the encounter.  Return if symptoms worsen or fail to improve.    Patient to reach out to office if new, worrisome, or unresolved symptoms arise or if no improvement in patient's condition. Patient verbalized understanding and is agreeable to treatment plan. All questions answered to patient's satisfaction.    Thersia Schuyler Stark, [place]

## 2024-10-27 NOTE — Patient Instructions (Signed)
  VISIT SUMMARY: During your visit, we discussed your ongoing sleep disturbances, anxiety, and recent cold symptoms. We reviewed your current medications and made some adjustments to better manage your conditions.  YOUR PLAN: -ACUTE OTITIS MEDIA, RIGHT EAR: You have an ear infection in your right ear that has not improved with conservative treatment. We will start you on antibiotics to help clear the infection.  -INSOMNIA: You have chronic difficulty falling and staying asleep. We will discontinue Lunesta and start you on Ambien  CR 6.25 mg nightly, which may help you stay asleep longer. Please be aware of potential side effects like vivid dreams and dependence. Additionally, practice good sleep habits such as keeping your room dark and cool and avoiding screens before bed.  -ANXIETY DISORDER: Your anxiety, particularly related to work, is contributing to your sleep issues. We will increase your fluoxetine  dose to 20 mg daily to help manage your anxiety and improve your sleep. Monitor your symptoms over the next 3-4 weeks to see if there is improvement.  INSTRUCTIONS: Please start taking the prescribed antibiotics for your ear infection. Discontinue Lunesta and begin taking Ambien  CR 6.25 mg nightly. Increase your fluoxetine  dose to 20 mg daily and monitor your anxiety and sleep over the next 3-4 weeks. Follow up if your symptoms do not improve or if you experience any side effects.

## 2024-10-28 ENCOUNTER — Ambulatory Visit: Admitting: Nurse Practitioner

## 2024-10-28 ENCOUNTER — Encounter: Payer: Self-pay | Admitting: Nurse Practitioner

## 2024-10-28 VITALS — BP 120/80 | HR 75

## 2024-10-28 DIAGNOSIS — N907 Vulvar cyst: Secondary | ICD-10-CM | POA: Diagnosis not present

## 2024-10-28 DIAGNOSIS — R590 Localized enlarged lymph nodes: Secondary | ICD-10-CM

## 2024-10-28 NOTE — Progress Notes (Signed)
   Acute Office Visit  Subjective:    Patient ID: Sheri Murphy, female    DOB: 07/22/1966, 58 y.o.   MRN: [id number]   HPI 58 y.o. presents today for right breast pain. Pain is located in axillary region for 2-3 weeks, tender with palpation, has not felt mass. URI 2 weeks ago and is now being treated for ear infection. Has been followed every 6 months since January 2025 for probable benign right breast mass. July 2025 - Cat C breast density, nonenlarged lymph node with normal cortical thickness within the RIGHT breast at 9 o'clock (unchanged), Oval circumscribed hypoechoic mass again seen at 9 o'clock 8 CMFN currently measuring 0.6 x 0.3 x 0.5 cm, compared to 0.7 x 0.3 x 0.6. 6 month follow up scheduled in January. 2021 benign right breast biopsy.   Also complains of cyst on right labia. Area has decreased in size, non-tender.   No LMP recorded (lmp unknown). Patient has had a hysterectomy.    Review of Systems  Constitutional: Negative.   HENT:  Positive for ear pain.   Genitourinary:  Positive for genital sores.  Right breast: + pain. Negative for mass, swelling, skin changes, nipple discharge     Objective:    Physical Exam Constitutional:      Appearance: Normal appearance.  Chest:  Breasts:    Right: Tenderness present. No swelling, bleeding, inverted nipple, mass, nipple discharge or skin change.    Genitourinary:  Lymphadenopathy:     Upper Body:     Right upper body: Axillary adenopathy present.     BP 120/80   Pulse 75   LMP  (LMP Unknown) Comment: no menustral cycle since 2018 uterine ablation  SpO2 99%  Wt Readings from Last 3 Encounters:  10/27/24 173 lb (78.5 kg)  10/25/24 174 lb 4.8 oz (79.1 kg)  10/08/24 174 lb (78.9 kg)        Sheri Murphy, CMA present as biomedical engineer.   Assessment & Plan:   Problem List Items Addressed This Visit   None Visit Diagnoses       Lymphadenopathy, axillary    -  Primary     Labial cyst          Plan: Probable  reactive axillary lymph node s/t URI and ear infection. Will monitor. If it does not resolve in 1-2 weeks will schedule diagnostic imaging. Labial cyst resolving ~3 mm, only felt with deep palpation. Will monitor.   Return if symptoms worsen or fail to improve.    Sheri DELENA Shutter DNP, 11:00 AM 10/28/2024

## 2024-11-05 ENCOUNTER — Ambulatory Visit (HOSPITAL_BASED_OUTPATIENT_CLINIC_OR_DEPARTMENT_OTHER)

## 2024-11-05 ENCOUNTER — Encounter: Payer: Self-pay | Admitting: Nurse Practitioner

## 2024-11-05 ENCOUNTER — Other Ambulatory Visit: Payer: Self-pay

## 2024-11-05 ENCOUNTER — Other Ambulatory Visit: Payer: Self-pay | Admitting: Nurse Practitioner

## 2024-11-05 DIAGNOSIS — N644 Mastodynia: Secondary | ICD-10-CM

## 2024-11-05 NOTE — Telephone Encounter (Signed)
 Please send referral for diagnostic imaging of right breast and axilla for breast pain.

## 2024-11-08 ENCOUNTER — Encounter (HOSPITAL_BASED_OUTPATIENT_CLINIC_OR_DEPARTMENT_OTHER): Payer: Self-pay | Admitting: Family Medicine

## 2024-11-12 ENCOUNTER — Ambulatory Visit (HOSPITAL_BASED_OUTPATIENT_CLINIC_OR_DEPARTMENT_OTHER): Admitting: Family Medicine

## 2024-11-13 ENCOUNTER — Other Ambulatory Visit (HOSPITAL_COMMUNITY): Payer: Self-pay

## 2024-11-15 ENCOUNTER — Other Ambulatory Visit (HOSPITAL_COMMUNITY): Payer: Self-pay

## 2024-11-15 ENCOUNTER — Encounter: Payer: Self-pay | Admitting: Gastroenterology

## 2024-11-30 ENCOUNTER — Other Ambulatory Visit: Payer: Self-pay | Admitting: Nurse Practitioner

## 2024-11-30 ENCOUNTER — Ambulatory Visit
Admission: RE | Admit: 2024-11-30 | Discharge: 2024-11-30 | Disposition: A | Source: Ambulatory Visit | Attending: Nurse Practitioner

## 2024-11-30 ENCOUNTER — Ambulatory Visit (AMBULATORY_SURGERY_CENTER)

## 2024-11-30 ENCOUNTER — Other Ambulatory Visit (HOSPITAL_BASED_OUTPATIENT_CLINIC_OR_DEPARTMENT_OTHER): Payer: Self-pay

## 2024-11-30 VITALS — Ht 64.75 in | Wt 178.0 lb

## 2024-11-30 DIAGNOSIS — N644 Mastodynia: Secondary | ICD-10-CM

## 2024-11-30 DIAGNOSIS — N6489 Other specified disorders of breast: Secondary | ICD-10-CM | POA: Diagnosis not present

## 2024-11-30 DIAGNOSIS — Z800 Family history of malignant neoplasm of digestive organs: Secondary | ICD-10-CM

## 2024-11-30 DIAGNOSIS — R928 Other abnormal and inconclusive findings on diagnostic imaging of breast: Secondary | ICD-10-CM | POA: Diagnosis not present

## 2024-11-30 DIAGNOSIS — Z860100 Personal history of colon polyps, unspecified: Secondary | ICD-10-CM

## 2024-11-30 MED ORDER — NA SULFATE-K SULFATE-MG SULF 17.5-3.13-1.6 GM/177ML PO SOLN
1.0000 | Freq: Once | ORAL | 0 refills | Status: AC
  Filled 2024-11-30: qty 354, 1d supply, fill #0

## 2024-11-30 NOTE — Progress Notes (Signed)
 No egg or soy allergy known to patient  No issues known to pt with past sedation with any surgeries or procedures Patient denies ever being told they had issues or difficulty with intubation  No FH of Malignant Hyperthermia  Pt is on diet pills - On Trulicity , 7 day hold instructions provided  Pt is not on  home 02  Pt is not on blood thinners  Pt has issues with constipation; Takes dulcolax; Extra Miralax BID with prep No A fib or A flutter Have any cardiac testing pending--No Pt can ambulate  Pt denies use of chewing tobacco Discussed diabetic I weight loss medication holds Discussed NSAID holds Checked BMI Pt instructed to use [employer] or GoodRx for a price reduction on prep  Patient's chart reviewed by Norleen Schillings CNRA prior to previsit and patient appropriate for the [facility].  Pre visit completed and red dot placed by patient's name on their procedure day (on provider's schedule).

## 2024-12-01 ENCOUNTER — Ambulatory Visit: Payer: Self-pay | Admitting: Nurse Practitioner

## 2024-12-01 DIAGNOSIS — R928 Other abnormal and inconclusive findings on diagnostic imaging of breast: Secondary | ICD-10-CM

## 2024-12-02 NOTE — Progress Notes (Signed)
 Hi Sheri Murphy, Your diagnostic mammogram shows a stable probably benign mass in the right breast which has not changed since January.  Radiology has recommended a repeat diagnostic mammogram and ultrasound in 1 year.  I have placed the orders for the breast center and they should call you to schedule these for December 2026.

## 2024-12-06 ENCOUNTER — Ambulatory Visit (INDEPENDENT_AMBULATORY_CARE_PROVIDER_SITE_OTHER): Admitting: Family Medicine

## 2024-12-06 ENCOUNTER — Encounter (HOSPITAL_BASED_OUTPATIENT_CLINIC_OR_DEPARTMENT_OTHER): Payer: Self-pay | Admitting: Family Medicine

## 2024-12-06 ENCOUNTER — Other Ambulatory Visit (HOSPITAL_BASED_OUTPATIENT_CLINIC_OR_DEPARTMENT_OTHER): Payer: Self-pay

## 2024-12-06 ENCOUNTER — Other Ambulatory Visit: Payer: Self-pay

## 2024-12-06 VITALS — BP 138/84 | HR 78 | Ht 67.0 in | Wt 178.2 lb

## 2024-12-06 DIAGNOSIS — M720 Palmar fascial fibromatosis [Dupuytren]: Secondary | ICD-10-CM | POA: Diagnosis not present

## 2024-12-06 DIAGNOSIS — R002 Palpitations: Secondary | ICD-10-CM | POA: Diagnosis not present

## 2024-12-06 MED ORDER — VENLAFAXINE HCL ER 37.5 MG PO CP24
37.5000 mg | ORAL_CAPSULE | Freq: Every day | ORAL | 3 refills | Status: DC
  Filled 2024-12-06: qty 30, 30d supply, fill #0

## 2024-12-06 MED ORDER — FLUOXETINE HCL 10 MG PO TABS
ORAL_TABLET | ORAL | 0 refills | Status: DC
  Filled 2024-12-06: qty 15, 21d supply, fill #0

## 2024-12-06 NOTE — Progress Notes (Signed)
 Subjective:   Sheri Murphy 1966-04-26 12/06/2024  Chief Complaint  Patient presents with   Palpitations    Pt came in with heart palpitation concerns that has increased over the last couple of weeks.    Discussed the use of AI scribe software for clinical note transcription with the patient, who gave verbal consent to proceed.  History of Present Illness Sheri Murphy is a 58 year old female who presents with heart palpitations.  She has been experiencing heart palpitations over the past few weeks, described as a sensation of 'someone blowing into my face and taking my breath.' These occur several times a day, even at rest, and seem to worsen after eating, particularly in the evening. The palpitations are accompanied by headaches and a sensation of pressure in her head. No sweating or chest pain associated with the palpitations, and they do not wake her from sleep.  She has a history of heart palpitations and previously wore a heart monitor in 2023, which showed no issues. Recently, she increased her dose of fluoxetine  to 20mg  in October 2025 by PCP, suspecting it may have triggered the palpitations. After reducing the dose back to 10 mg last week, she noticed a decrease in the frequency of palpitations.  There is no known family history of atrial fibrillation or irregular heartbeats. She recently stopped drinking caffeinated coffee, switching to decaffeinated, as she was consuming two to three cups a day.  She has also been experiencing issues with her hands, including difficulty typing and a locking sensation in her fingers, particularly her ring fingers, which she describes as 'almost like arthritis in my joints.' She has started stretching exercises for her fingers two weeks ago.    The following portions of the patient's history were reviewed and updated as appropriate: past medical history, past surgical history, family history, social history, allergies, medications, and problem  list.   Patient Active Problem List   Diagnosis Date Noted   Dupuytren's contracture of both hands 12/06/2024   Palpitations 12/06/2024   Abnormal mammogram of right breast 12/25/2023   OSA on CPAP 10/04/2020   Lumbar radiculopathy 12/03/2018   Chronic left-sided thoracic back pain 05/15/2018   Constipation 04/14/2018   Gastroesophageal reflux disease without esophagitis 04/14/2018   Fibroid uterus 03/30/2018   Hypertension 03/30/2018   Allergic rhinitis with postnasal drip 09/30/2017   Mild episode of recurrent major depressive disorder 09/30/2017   History of renal cell carcinoma 08/18/2017   Anemia 01/01/2017   Hyperlipidemia associated with type 2 diabetes mellitus (HCC) 08/27/2016   Chronic tension-type headache, not intractable 08/27/2016   Low vitamin D  level 08/27/2016   Controlled type 2 diabetes mellitus without complication, without long-term current use of insulin  (HCC) 02/02/2016   Anxiety and depression 01/08/2016   Benign essential HTN 01/08/2016   Uterus, adenomyosis 12/14/2013   Insomnia 08/13/2011   Obesity 08/09/2011   Past Medical History:  Diagnosis Date   Abnormal liver function 01/08/2016   Anemia    Anxiety    Constipation 11/28/2021   Contracture of right Achilles tendon 12/31/2018   Controlled type 2 diabetes mellitus without complication (HCC)    Cough 04/04/2020   Last Assessment & Plan:    I still feel related to allergies, laryngopharyngeal reflux and differential  Advised her to have a chest x-ray since she has been coughing over a year now.  Continue Zyrtec, add Singulair nightly for 1 month, recheck in 1 month.  She is on lisinopril but is pretty  sure symptoms started prior to starting lisinopril and is on a very low dose for renal protection.  No impr   Dysuria 08/09/2011   Fatty liver    hx of elevated liver enzymes in past   Fibroids    Fibromyalgia    GERD (gastroesophageal reflux disease)    Headache 08/13/2011   IMO Update for Oct  2023 made by user on 10.3.23     Heart murmur    mild no cardiologist   Hematuria    History of COVID-19 04/2021   fever cough and cold symptoms, took oral antivirals, all symptoms resolved   Hypertension    IBS (irritable bowel syndrome)    Iliotibial band syndrome affecting left lower leg 02/02/2016   Insomnia    Menorrhagia    Obesity    Pelvic pain 11/28/2021   recurrent   Postoperative state 12/05/2021   Renal cell carcinoma, left (HCC) 2018   surgery only chemo/radiation not required, small spot removed   Urinary incontinence 11/28/2021   weras small pad   Urinary urgency    Vitamin D  deficiency    Wears glasses    Past Surgical History:  Procedure Laterality Date   BREAST BIOPSY Right 2021   benign   COLONOSCOPY  2021   DILATATION & CURETTAGE/HYSTEROSCOPY WITH MYOSURE N/A 05/13/2018   Procedure: DILATATION & CURETTAGE/HYSTEROSCOPY WITH NOVASURE;  Surgeon: Lavoie, Marie-Lyne, MD;  Location: [facility];  Service: Gynecology;  Laterality: N/A;   Left renal mass removal  07/2017   Thermal Microwave Ablation   ROBOTIC ASSISTED TOTAL HYSTERECTOMY Bilateral 12/05/2021   Procedure: XI ROBOTIC ASSISTED TOTAL HYSTERECTOMY WITH BILATERAL SALPINGECTOMY;  Surgeon: Lavoie, Marie-Lyne, MD;  Location: [facility];  Service: Gynecology;  Laterality: Bilateral;   SKIN LESION EXCISION  2012   basal cell removed no problems since, sees dermatology prn now   uterine ablation  2018   WISDOM TOOTH EXTRACTION     yrs ago per pt on 11-28-2021   Family History  Problem Relation Age of Onset   Hypertension Mother    Colon cancer Mother 18   Hypertension Father    Hypertension Maternal Aunt    Stomach cancer Maternal Grandmother    Cancer Maternal Grandmother        small intestine    Hypertension Maternal Grandmother    Pancreatic cancer Maternal Grandfather    Diabetes Paternal Grandmother    BRCA 1/2 Neg Hx    Breast cancer Neg Hx    Rectal cancer Neg  Hx    Esophageal cancer Neg Hx    Outpatient Medications Prior to Visit  Medication Sig Dispense Refill   bisoprolol -hydrochlorothiazide  (ZIAC ) 10-6.25 MG tablet Take 1 tablet by mouth daily. 90 tablet 3   Continuous Glucose Sensor (DEXCOM G7 SENSOR) MISC Apply 1 sensor to skin every 10 days 3 each 12   Dulaglutide  0.75 MG/0.5ML SOAJ Inject 0.75 mg into the skin once a week. 6 mL 3   loratadine  (CLARITIN ) 10 MG tablet Take 1 tablet (10 mg total) by mouth daily. 90 tablet 3   losartan  (COZAAR ) 50 MG tablet Take 1 tablet (50 mg total) by mouth daily. 30 tablet 3   Omega-3 Fatty Acids (FISH OIL CONCENTRATE PO) Take 1 capsule by mouth daily.     omeprazole  (PRILOSEC) 40 MG capsule Take 1 capsule (40 mg total) by mouth daily. 90 capsule 3   rosuvastatin  (CRESTOR ) 5 MG tablet Take 1 tablet (5 mg total) by mouth daily. 90  tablet 3   triamcinolone  cream (KENALOG ) 0.1 % Apply topically 2 (two) times daily as needed. 30 g 2   VITAMIN D , CHOLECALCIFEROL, PO Take 1 capsule by mouth daily.     zolpidem  (AMBIEN  CR) 6.25 MG CR tablet Take 1 tablet (6.25 mg total) by mouth at bedtime as needed for sleep. 30 tablet 2   FLUoxetine  (PROZAC ) 20 MG capsule Take 1 capsule (20 mg total) by mouth daily. 90 capsule 2   No facility-administered medications prior to visit.   Allergies  Allergen Reactions   Amitriptyline Other (See Comments)    nightmares   Chlordiazepoxide Other (See Comments)    nightmares   Macrodantin  [Nitrofurantoin ] Hives     ROS: A complete ROS was performed with pertinent positives/negatives noted in the HPI. The remainder of the ROS are negative.    Objective:   Today's Vitals   12/06/24 0826 12/06/24 0910  BP: (!) 151/93 138/84  Pulse: 78   SpO2: 100%   Weight: 178 lb 3.2 oz (80.8 kg)   Height: 5' 7 (1.702 m)     Physical Exam   GENERAL: Well-appearing, in NAD. Well nourished.  SKIN: Pink, warm and dry. No rash, lesion, ulceration, or ecchymoses.  Head:  Normocephalic. NECK: Trachea midline. Full ROM w/o pain or tenderness. RESPIRATORY: Chest wall symmetrical. Respirations even and non-labored. Breath sounds clear to auscultation bilaterally.  CARDIAC: S1, S2 present, regular rate and rhythm without murmur or gallops. Peripheral pulses 2+ bilaterally.  MSK: Muscle tone and strength appropriate for age. Contracture present to bilateral 4th ring fingers upon frequent flexion. Able to be extended manually.  NEUROLOGIC: No motor or sensory deficits. Steady, even gait. C2-C12 intact.  PSYCH/MENTAL STATUS: Alert, oriented x 3. Cooperative, appropriate mood and affect.   EKG: 12/06/2024  Vent. rate 78 BPM  PR interval 158 ms  QRS duration 88 ms  QT/QTcB 400/456 ms  P-R-T axes 43 36 30  Normal sinus rhythm  Normal ECG  No significant change since last tracing  Confirmed by Pietro Rogue ([id number]) on 12/06/2024 9:10:42 AM      Assessment & Plan:   1. Palpitations (Primary) EKG reviewed by PCP without appreciable concerns or changes. Palpitations is a side effect of fluoxetine  and given patient's improvement with decreased fluoxetine  dosage, we will recommend to discontinue fluoxetine  and trial different antianxiety medication.  Patient was agreeable to try Effexor .  Weaning schedule from fluoxetine  provided and patient will wean completely from fluoxetine  prior to starting Effexor  given palpitations.  We discussed possible side effects and adverse effects of medication and she verbalized understanding.  Recommend following up in 2 months or sooner if needed.  Safety plan reviewed with patient.  If palpitations are not improving with medication changes, patient will reach out to PCP.  Given unremarkable history with previous Holter monitor and recent lab work that was within normal limits, do not recommend with proceeding with further cardiac evaluation at this time given improvement with decreased SSRI.  2. Dupuytren's contracture of both  hands Discussed Dupuytren's contracture and recommended orthopedic referral.  Patient declined at this time and will continue with PT.   Meds ordered this encounter  Medications   venlafaxine  XR (EFFEXOR -XR) 37.5 MG 24 hr capsule    Sig: Take 1 capsule (37.5 mg total) by mouth daily with breakfast.    Dispense:  30 capsule    Refill:  3    Supervising Provider:   DE CUBA, RAYMOND J [[id number]]   FLUoxetine  (PROZAC ) 10 MG  tablet    Sig: Take 1 tablet (10 mg total) by mouth daily for 7 days, THEN 0.5 tablets (5 mg total) daily for 7 days, THEN 0.5 tablets (5 mg total) every other day for 7 days.    Dispense:  15 tablet    Refill:  0    Supervising Provider:   DE CUBA, RAYMOND J [[id number]]   Lab Orders  No laboratory test(s) ordered today   No images are attached to the encounter or orders placed in the encounter.  Return in about 2 months (around 02/06/2025) for Follow up Anxiety and Palpitations  (can be virtual ) .    Patient to reach out to office if new, worrisome, or unresolved symptoms arise or if no improvement in patient's condition. Patient verbalized understanding and is agreeable to treatment plan. All questions answered to patient's satisfaction.    Sheri Murphy, [place]

## 2024-12-06 NOTE — Patient Instructions (Addendum)
 Fluoxetine  Wean:  Take 1 tablet (10mg  ) daily for 7 days, then take 5mg  (1/2 tablet) daily for 7 days, then take 5mg  (1/2 tab) every other day for 7 days.   You can then start your Effexor  37.5mg  XR daily. If after 5-7 days anxiety has not improved, you can increase to 2 tablets (75mg  total) daily.    If palpitations do not improve in the next 2-3 weeks, please let me know.

## 2024-12-08 ENCOUNTER — Encounter: Payer: Self-pay | Admitting: Gastroenterology

## 2024-12-08 DIAGNOSIS — J309 Allergic rhinitis, unspecified: Secondary | ICD-10-CM | POA: Diagnosis not present

## 2024-12-08 DIAGNOSIS — G4733 Obstructive sleep apnea (adult) (pediatric): Secondary | ICD-10-CM | POA: Diagnosis not present

## 2024-12-08 DIAGNOSIS — R059 Cough, unspecified: Secondary | ICD-10-CM | POA: Diagnosis not present

## 2024-12-14 ENCOUNTER — Other Ambulatory Visit (HOSPITAL_COMMUNITY)
Admission: RE | Admit: 2024-12-14 | Discharge: 2024-12-14 | Disposition: A | Source: Ambulatory Visit | Attending: Obstetrics and Gynecology | Admitting: Obstetrics and Gynecology

## 2024-12-14 ENCOUNTER — Ambulatory Visit: Admitting: Obstetrics and Gynecology

## 2024-12-14 ENCOUNTER — Encounter: Payer: Self-pay | Admitting: Obstetrics and Gynecology

## 2024-12-14 VITALS — BP 128/74 | HR 77 | Ht 64.5 in | Wt 179.4 lb

## 2024-12-14 DIAGNOSIS — Z800 Family history of malignant neoplasm of digestive organs: Secondary | ICD-10-CM

## 2024-12-14 DIAGNOSIS — N952 Postmenopausal atrophic vaginitis: Secondary | ICD-10-CM | POA: Diagnosis not present

## 2024-12-14 DIAGNOSIS — D229 Melanocytic nevi, unspecified: Secondary | ICD-10-CM

## 2024-12-14 DIAGNOSIS — Z9189 Other specified personal risk factors, not elsewhere classified: Secondary | ICD-10-CM

## 2024-12-14 DIAGNOSIS — R9431 Abnormal electrocardiogram [ECG] [EKG]: Secondary | ICD-10-CM

## 2024-12-14 DIAGNOSIS — Z01419 Encounter for gynecological examination (general) (routine) without abnormal findings: Secondary | ICD-10-CM | POA: Insufficient documentation

## 2024-12-14 DIAGNOSIS — E2839 Other primary ovarian failure: Secondary | ICD-10-CM

## 2024-12-14 DIAGNOSIS — Z1211 Encounter for screening for malignant neoplasm of colon: Secondary | ICD-10-CM

## 2024-12-14 NOTE — Progress Notes (Signed)
 58 y.o. y.o. female here for annual exam. No LMP recorded (lmp unknown). Patient has had a hysterectomy.    [id number] Single   RP:  Established patient presenting for annual gyn exam   HPI: XI Robotic TLH/BS on 12/05/2021. Patho benign.  No indication for a Pap test at this time. No pelvic pain.  Abstinent currently.  No abnormal vaginal discharge.  Urine and bowel movements normal.  Breasts normal.  Mammo Neg in 7/25.  More physically active.  Health labs with family physician.  Mother with colon cancer.  Patient had a screening colonoscopy in 2021, benign Polyp.  Will continue colonoscopy every 5 years.   Dexa: to get baseline with risk factor of DM and IBS and hypoestrogen Referral for annual derm check placed CT cardiac score test: referral placed  Body mass index is 30.32 kg/m.     12/14/2024    1:46 PM 10/08/2024   11:05 AM 07/05/2024    8:07 AM  Depression screen PHQ 2/9  Decreased Interest 0 1 1  Down, Depressed, Hopeless 0 0 0  PHQ - 2 Score 0 1 1  Altered sleeping  2 0  Tired, decreased energy  1 1  Change in appetite  1 1  Feeling bad or failure about yourself   0 1  Trouble concentrating  0 0  Moving slowly or fidgety/restless  0 1  Suicidal thoughts  0 0  PHQ-9 Score  5  5   Difficult doing work/chores  Not difficult at all Not difficult at all     Data saved with a previous flowsheet row definition    Blood pressure 128/74, pulse 77, height 5' 4.5 (1.638 m), weight 179 lb 6.4 oz (81.4 kg), SpO2 98%.     Component Value Date/Time   DIAGPAP  07/31/2021 1633    - Negative for intraepithelial lesion or malignancy (NILM)   ADEQPAP  07/31/2021 1633    Satisfactory for evaluation; transformation zone component PRESENT.    GYN HISTORY:    Component Value Date/Time   DIAGPAP  07/31/2021 1633    - Negative for intraepithelial lesion or malignancy (NILM)   ADEQPAP  07/31/2021 1633    Satisfactory for evaluation; transformation zone component PRESENT.    OB  History  Gravida Para Term Preterm AB Living  3 0   3 0  SAB IAB Ectopic Multiple Live Births  1 2       # Outcome Date GA Lbr Len/2nd Weight Sex Type Anes PTL Lv  3 IAB           2 IAB           1 SAB             Past Medical History:  Diagnosis Date   Abnormal liver function 01/08/2016   Anemia    Anxiety    Constipation 11/28/2021   Contracture of right Achilles tendon 12/31/2018   Controlled type 2 diabetes mellitus without complication (HCC)    Cough 04/04/2020   Last Assessment & Plan:    I still feel related to allergies, laryngopharyngeal reflux and differential  Advised her to have a chest x-ray since she has been coughing over a year now.  Continue Zyrtec, add Singulair nightly for 1 month, recheck in 1 month.  She is on lisinopril but is pretty sure symptoms started prior to starting lisinopril and is on a very low dose for renal protection.  No impr   Dysuria 08/09/2011  Fatty liver    hx of elevated liver enzymes in past   Fibroids    Fibromyalgia    GERD (gastroesophageal reflux disease)    Headache 08/13/2011   IMO Update for Oct 2023 made by user on 10.3.23     Heart murmur    mild no cardiologist   Hematuria    History of COVID-19 04/2021   fever cough and cold symptoms, took oral antivirals, all symptoms resolved   Hypertension    IBS (irritable bowel syndrome)    Iliotibial band syndrome affecting left lower leg 02/02/2016   Insomnia    Menorrhagia    Obesity    Pelvic pain 11/28/2021   recurrent   Postoperative state 12/05/2021   Renal cell carcinoma, left (HCC) 2018   surgery only chemo/radiation not required, small spot removed   Urinary incontinence 11/28/2021   weras small pad   Urinary urgency    Vitamin D  deficiency    Wears glasses     Past Surgical History:  Procedure Laterality Date   BREAST BIOPSY Right 2021   benign   COLONOSCOPY  2021   DILATATION & CURETTAGE/HYSTEROSCOPY WITH MYOSURE N/A 05/13/2018   Procedure: DILATATION &  CURETTAGE/HYSTEROSCOPY WITH NOVASURE;  Surgeon: Lavoie, Marie-Lyne, MD;  Location: [facility];  Service: Gynecology;  Laterality: N/A;   Left renal mass removal  07/2017   Thermal Microwave Ablation   ROBOTIC ASSISTED TOTAL HYSTERECTOMY Bilateral 12/05/2021   Procedure: XI ROBOTIC ASSISTED TOTAL HYSTERECTOMY WITH BILATERAL SALPINGECTOMY;  Surgeon: Lavoie, Marie-Lyne, MD;  Location: [facility] [employer];  Service: Gynecology;  Laterality: Bilateral;   SKIN LESION EXCISION  2012   basal cell removed no problems since, sees dermatology prn now   uterine ablation  2018   WISDOM TOOTH EXTRACTION     yrs ago per pt on 11-28-2021    Medications Ordered Prior to Encounter[1]  Social History   Socioeconomic History   Marital status: Single    Spouse name: Not on file   Number of children: Not on file   Years of education: Not on file   Highest education level: Not on file  Occupational History   Not on file  Tobacco Use   Smoking status: Never   Smokeless tobacco: Never  Vaping Use   Vaping status: Never Used  Substance and Sexual Activity   Alcohol use: Yes    Comment: periodically   Drug use: No   Sexual activity: Yes    Partners: Male    Birth control/protection: Surgical    Comment: 1st intercourse- 18, hysterectomy  Other Topics Concern   Not on file  Social History Narrative   Not on file   Social Drivers of Health   Tobacco Use: Low Risk (12/14/2024)   Patient History    Smoking Tobacco Use: Never    Smokeless Tobacco Use: Never    Passive Exposure: Not on file  Financial Resource Strain: Low Risk (03/15/2024)   Overall Financial Resource Strain (CARDIA)    Difficulty of Paying Living Expenses: Not very hard  Food Insecurity: No Food Insecurity (12/06/2024)   Epic    Worried About Radiation Protection Practitioner of Food in the Last Year: Never true    Ran Out of Food in the Last Year: Never true  Transportation Needs: No Transportation Needs (12/06/2024)   Epic     Lack of Transportation (Medical): No    Lack of Transportation (Non-Medical): No  Physical Activity: Insufficiently Active (03/15/2024)   Exercise Vital Sign  Days of Exercise per Week: 1 day    Minutes of Exercise per Session: 30 min  Stress: Stress Concern Present (03/15/2024)   [employer] of Occupational Health - Occupational Stress Questionnaire    Feeling of Stress : To some extent  Social Connections: Socially Isolated (03/15/2024)   Social Connection and Isolation Panel    Frequency of Communication with Friends and Family: Once a week    Frequency of Social Gatherings with Friends and Family: Never    Attends Religious Services: 1 to 4 times per year    Active Member of [employer] or Organizations: No    Attends Banker Meetings: Never    Marital Status: Never married  Intimate Partner Violence: Not At Risk (03/15/2024)   Humiliation, Afraid, Rape, and Kick questionnaire    Fear of Current or Ex-Partner: No    Emotionally Abused: No    Physically Abused: No    Sexually Abused: No  Depression (PHQ2-9): Low Risk (12/14/2024)   Depression (PHQ2-9)    PHQ-2 Score: 0  Recent Concern: Depression (PHQ2-9) - Medium Risk (10/08/2024)   Depression (PHQ2-9)    PHQ-2 Score: 5  Alcohol Screen: Low Risk (03/15/2024)   Alcohol Screen    Last Alcohol Screening Score (AUDIT): 1  Housing: Low Risk (12/06/2024)   Epic    Unable to Pay for Housing in the Last Year: No    Number of Times Moved in the Last Year: 0    Homeless in the Last Year: No  Utilities: Not At Risk (12/06/2024)   Epic    Threatened with loss of utilities: No  Health Literacy: Adequate Health Literacy (03/15/2024)   B1300 Health Literacy    Frequency of need for help with medical instructions: Never    Family History  Problem Relation Age of Onset   Hypertension Mother    Colon cancer Mother 5   Hypertension Father    Hypertension Maternal Aunt    Stomach cancer Maternal Grandmother    Cancer  Maternal Grandmother        small intestine    Hypertension Maternal Grandmother    Pancreatic cancer Maternal Grandfather    Diabetes Paternal Grandmother    BRCA 1/2 Neg Hx    Breast cancer Neg Hx    Rectal cancer Neg Hx    Esophageal cancer Neg Hx      Allergies[2]    Patient's last menstrual period was No LMP recorded (lmp unknown). Patient has had a hysterectomy..            Review of Systems Alls systems reviewed and are negative.     Physical Exam Constitutional:      Appearance: Normal appearance.  Genitourinary:     Vulva normal.     No lesions in the vagina.     Right Labia: No rash, lesions or skin changes.    Left Labia: No lesions, skin changes or rash.    Vaginal cuff intact.    No vaginal discharge or tenderness.     No vaginal prolapse present.    Mild vaginal atrophy present.     Right Adnexa: no mass present.    Left Adnexa: no mass present.    Cervix is not absent.     Uterus is not absent. Breasts:    Right: Normal.     Left: Normal.  HENT:     Head: Normocephalic.  Neck:     Thyroid : No thyroid  mass, thyromegaly or thyroid  tenderness.  Cardiovascular:  Rate and Rhythm: Normal rate and regular rhythm.     Heart sounds: Normal heart sounds, S1 normal and S2 normal.  Pulmonary:     Effort: Pulmonary effort is normal.     Breath sounds: Normal breath sounds and air entry.  Abdominal:     General: Bowel sounds are normal. There is no distension.     Palpations: Abdomen is soft. There is no mass.     Tenderness: There is no abdominal tenderness. There is no guarding or rebound.  Musculoskeletal:     Cervical back: Full passive range of motion without pain, normal range of motion and neck supple. No tenderness.     Right lower leg: No edema.     Left lower leg: No edema.  Neurological:     Mental Status: She is alert.  Skin:    General: Skin is warm.  Psychiatric:        Mood and Affect: Mood normal.        Behavior: Behavior normal.         Thought Content: Thought content normal.  Vitals and nursing note reviewed. Exam conducted with a chaperone present.       A:         Well Woman GYN exam                             P:        Pap smear collected today Encouraged annual mammogram screening Colon cancer screening referral placed today DXA ordered today Labs and immunizations to do with PMD Discussed breast self exams Encouraged healthy lifestyle practices Encouraged Vit D and Calcium    No follow-ups on file.  Sheri Murphy     [1]  Current Outpatient Medications on File Prior to Visit  Medication Sig Dispense Refill   bisoprolol -hydrochlorothiazide  (ZIAC ) 10-6.25 MG tablet Take 1 tablet by mouth daily. 90 tablet 3   Continuous Glucose Sensor (DEXCOM G7 SENSOR) MISC Apply 1 sensor to skin every 10 days 3 each 12   Dulaglutide  0.75 MG/0.5ML SOAJ Inject 0.75 mg into the skin once a week. 6 mL 3   FLUoxetine  (PROZAC ) 10 MG tablet Take 1 tablet (10 mg total) by mouth daily for 7 days, THEN 0.5 tablets (5 mg total) daily for 7 days, THEN 0.5 tablets (5 mg total) every other day for 7 days. 15 tablet 0   loratadine  (CLARITIN ) 10 MG tablet Take 1 tablet (10 mg total) by mouth daily. 90 tablet 3   losartan  (COZAAR ) 50 MG tablet Take 1 tablet (50 mg total) by mouth daily. 30 tablet 3   Omega-3 Fatty Acids (FISH OIL CONCENTRATE PO) Take 1 capsule by mouth daily.     omeprazole  (PRILOSEC) 40 MG capsule Take 1 capsule (40 mg total) by mouth daily. 90 capsule 3   rosuvastatin  (CRESTOR ) 5 MG tablet Take 1 tablet (5 mg total) by mouth daily. 90 tablet 3   triamcinolone  cream (KENALOG ) 0.1 % Apply topically 2 (two) times daily as needed. 30 g 2   venlafaxine  XR (EFFEXOR -XR) 37.5 MG 24 hr capsule Take 1 capsule (37.5 mg total) by mouth daily with breakfast. 30 capsule 3   VITAMIN D , CHOLECALCIFEROL, PO Take 1 capsule by mouth daily.     zolpidem  (AMBIEN  CR) 6.25 MG CR tablet Take 1 tablet (6.25 mg total) by mouth at  bedtime as needed for sleep. 30 tablet 2   No current facility-administered medications  on file prior to visit.  [2]  Allergies Allergen Reactions   Amitriptyline Other (See Comments)    nightmares   Chlordiazepoxide Other (See Comments)    nightmares   Macrodantin  [Nitrofurantoin ] Hives

## 2024-12-16 ENCOUNTER — Encounter: Payer: Self-pay | Admitting: Gastroenterology

## 2024-12-16 ENCOUNTER — Ambulatory Visit: Admitting: Gastroenterology

## 2024-12-16 VITALS — BP 161/93 | HR 88 | Temp 97.5°F | Resp 9 | Ht 64.75 in | Wt 178.0 lb

## 2024-12-16 DIAGNOSIS — Q438 Other specified congenital malformations of intestine: Secondary | ICD-10-CM | POA: Diagnosis not present

## 2024-12-16 DIAGNOSIS — Z1211 Encounter for screening for malignant neoplasm of colon: Secondary | ICD-10-CM | POA: Diagnosis not present

## 2024-12-16 DIAGNOSIS — Z860100 Personal history of colon polyps, unspecified: Secondary | ICD-10-CM

## 2024-12-16 DIAGNOSIS — K635 Polyp of colon: Secondary | ICD-10-CM | POA: Diagnosis not present

## 2024-12-16 DIAGNOSIS — K6389 Other specified diseases of intestine: Secondary | ICD-10-CM

## 2024-12-16 DIAGNOSIS — D123 Benign neoplasm of transverse colon: Secondary | ICD-10-CM

## 2024-12-16 DIAGNOSIS — K562 Volvulus: Secondary | ICD-10-CM | POA: Diagnosis not present

## 2024-12-16 DIAGNOSIS — Z800 Family history of malignant neoplasm of digestive organs: Secondary | ICD-10-CM

## 2024-12-16 DIAGNOSIS — D125 Benign neoplasm of sigmoid colon: Secondary | ICD-10-CM

## 2024-12-16 LAB — CYTOLOGY - PAP: Diagnosis: NEGATIVE

## 2024-12-16 NOTE — Progress Notes (Signed)
 Report to PACU, RN, vss, BBS= Clear.

## 2024-12-16 NOTE — Op Note (Signed)
 [facility] Patient Name: Sheri Murphy Procedure Date: 12/16/2024 11:16 AM MRN: [id number] Endoscopist: Glendia E. Stacia , MD, [id number] Age: 58 Referring MD:  Date of Birth: [birth year] Gender: Female Account #: [id number] Procedure:                Colonoscopy Indications:              Surveillance: Personal history of colonic polyps                            (unknown histology) on last colonoscopy more than 3                            years ago Medicines:                Monitored Anesthesia Care Procedure:                Pre-Anesthesia Assessment:                           - Prior to the procedure, a History and Physical                            was performed, and patient medications and                            allergies were reviewed. The patient's tolerance of                            previous anesthesia was also reviewed. The risks                            and benefits of the procedure and the sedation                            options and risks were discussed with the patient.                            All questions were answered, and informed consent                            was obtained. Prior Anticoagulants: The patient has                            taken no anticoagulant or antiplatelet agents. ASA                            Grade Assessment: II - A patient with mild systemic                            disease. After reviewing the risks and benefits,                            the patient was deemed in satisfactory condition to  undergo the procedure.                           After obtaining informed consent, the colonoscope                            was passed under direct vision. Throughout the                            procedure, the patient's blood pressure, pulse, and                            oxygen saturations were monitored continuously. The                            Olympus Scope SN [id number] was introduced  through the                            anus and advanced to the the cecum, identified by                            appendiceal orifice and ileocecal valve. The                            colonoscopy was performed with difficulty due to                            borderline bowel prep quality, a redundant colon                            and significant looping. Successful completion of                            the procedure was aided by changing the patient to                            a supine position, using manual pressure and                            withdrawing and reinserting the scope. The patient                            tolerated the procedure well. The quality of the                            bowel preparation was adequate. The ileocecal                            valve, appendiceal orifice, and rectum were                            photographed. The bowel preparation used was SUPREP  via extended prep with split dose instruction. Scope In: 11:34:46 AM Scope Out: 12:08:02 PM Scope Withdrawal Time: 0 hours 13 minutes 30 seconds  Total Procedure Duration: 0 hours 33 minutes 16 seconds  Findings:                 The perianal and digital rectal examinations were                            normal. Pertinent negatives include normal                            sphincter tone and no palpable rectal lesions.                           A 5 mm polyp was found in the hepatic flexure. The                            polyp was sessile. The polyp was removed with a                            cold snare. Resection and retrieval were complete.                            Estimated blood loss was minimal.                           Multiple sessile polyps were found in the rectum                            and sigmoid colon. The polyps were diminutive in                            size. One of these polyps was removed with a cold                            snare.  Resection and retrieval were complete.                            Estimated blood loss was minimal.                           A diffuse area of mild melanosis was found in the                            entire colon.                           The exam was otherwise normal throughout the                            examined colon.                           The retroflexed view of the distal rectum and anal  verge was normal and showed no anal or rectal                            abnormalities. Complications:            No immediate complications. Estimated Blood Loss:     Estimated blood loss was minimal. Impression:               - One 5 mm polyp at the hepatic flexure, removed                            with a cold snare. Resected and retrieved.                           - Multiple diminutive polyps in the rectum and in                            the sigmoid colon, removed with a cold snare.                            Resected and retrieved. These were consistent with                            hyperplastic polyps.                           - Melanosis in the colon.                           - The distal rectum and anal verge are normal on                            retroflexion view. Recommendation:           - Patient has a contact number available for                            emergencies. The signs and symptoms of potential                            delayed complications were discussed with the                            patient. Return to normal activities tomorrow.                            Written discharge instructions were provided to the                            patient.                           - Resume previous diet.                           - Continue present medications.                           -  Await pathology results.                           - Repeat colonoscopy in 5 years for surveillance                            due to technical  difficulty of procedure, presence                            of a polyp and borderline bowel prep quality. Virna Livengood E. Stacia, MD 12/16/2024 12:17:22 PM This report has been signed electronically.

## 2024-12-16 NOTE — Progress Notes (Signed)
 Pt's states no medical or surgical changes since previsit or office visit.

## 2024-12-16 NOTE — Patient Instructions (Signed)
 Handout provided on polyps.  Resume previous diet.  Continue present medications.  Await pathology results.  Repeat colonoscopy in 5 years for surveillance due to technical difficulty of procedure, presence of a polyp and borderline bowel prep quality.   YOU HAD AN ENDOSCOPIC PROCEDURE TODAY AT THE [facility]:   Refer to the procedure report that was given to you for any specific questions about what was found during the examination.  If the procedure report does not answer your questions, please call your gastroenterologist to clarify.  If you requested that your care partner not be given the details of your procedure findings, then the procedure report has been included in a sealed envelope for you to review at your convenience later.  YOU SHOULD EXPECT: Some feelings of bloating in the abdomen. Passage of more gas than usual.  Walking can help get rid of the air that was put into your GI tract during the procedure and reduce the bloating. If you had a lower endoscopy (such as a colonoscopy or flexible sigmoidoscopy) you may notice spotting of blood in your stool or on the toilet paper. If you underwent a bowel prep for your procedure, you may not have a normal bowel movement for a few days.  Please Note:  You might notice some irritation and congestion in your nose or some drainage.  This is from the oxygen used during your procedure.  There is no need for concern and it should clear up in a day or so.  SYMPTOMS TO REPORT IMMEDIATELY:  Following lower endoscopy (colonoscopy or flexible sigmoidoscopy):  Excessive amounts of blood in the stool  Significant tenderness or worsening of abdominal pains  Swelling of the abdomen that is new, acute  Fever of 100F or higher  For urgent or emergent issues, a gastroenterologist can be reached at any hour by calling [phone]. Do not use MyChart messaging for urgent concerns.    DIET:  We do recommend a small meal at first, but then  you may proceed to your regular diet.  Drink plenty of fluids but you should avoid alcoholic beverages for 24 hours.  ACTIVITY:  You should plan to take it easy for the rest of today and you should NOT DRIVE or use heavy machinery until tomorrow (because of the sedation medicines used during the test).    FOLLOW UP: Our staff will call the number listed on your records the next business day following your procedure.  We will call around 7:15- 8:00 am to check on you and address any questions or concerns that you may have regarding the information given to you following your procedure. If we do not reach you, we will leave a message.     If any biopsies were taken you will be contacted by phone or by letter within the next 1-3 weeks.  Please call us  at [phone] if you have not heard about the biopsies in 3 weeks.    SIGNATURES/CONFIDENTIALITY: You and/or your care partner have signed paperwork which will be entered into your electronic medical record.  These signatures attest to the fact that that the information above on your After Visit Summary has been reviewed and is understood.  Full responsibility of the confidentiality of this discharge information lies with you and/or your care-partner.

## 2024-12-16 NOTE — Progress Notes (Signed)
 [facility] History and Physical   Primary Care Physician:  Knute Thersia Bitters, FNP   Reason for Procedure:   Colon cancer screening  Plan:    Screening colonoscopy   HPI: Sheri Murphy is a 58 y.o. female undergoing surveillance colonoscopy.  Her mother was diagnosed with colon cancer in her 53s.  She reports having a colonoscopy in 2016 and 2020 and having one polyp on one of those colonoscopies.   She has constipation-predominant IBS.    The patient was provided an opportunity to ask questions and all were answered. 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  Continue Zyrtec, add Singulair nightly for 1 month, recheck in 1 month.  She is on lisinopril but is pretty sure symptoms started prior to starting lisinopril and is on a very low dose for renal protection.  No impr   Dysuria 08/09/2011   Fatty liver    hx of elevated liver enzymes in past   Fibroids    Fibromyalgia    GERD (gastroesophageal reflux disease)    Headache 08/13/2011   IMO Update for Oct 2023 made by user on 10.3.23     Heart murmur    mild no cardiologist   Hematuria    History of COVID-19 04/2021   fever cough and cold symptoms, took oral antivirals, all symptoms resolved   Hypertension    IBS (irritable bowel syndrome)    Iliotibial band syndrome affecting left lower leg 02/02/2016   Insomnia    Menorrhagia    Obesity    Pelvic pain 11/28/2021   recurrent   Postoperative state 12/05/2021   Renal cell carcinoma, left (HCC) 2018   surgery only  chemo/radiation not required, small spot removed   Urinary incontinence 11/28/2021   weras small pad   Urinary urgency    Vitamin D  deficiency    Wears glasses     Past Surgical History:  Procedure Laterality Date   BREAST BIOPSY Right 2021   benign   COLONOSCOPY  2021   DILATATION & CURETTAGE/HYSTEROSCOPY WITH MYOSURE N/A 05/13/2018   Procedure: DILATATION & CURETTAGE/HYSTEROSCOPY WITH NOVASURE;  Surgeon: Lavoie, Marie-Lyne, MD;  Location: [facility];  Service: Gynecology;  Laterality: N/A;   Left renal mass removal  07/2017   Thermal Microwave Ablation   ROBOTIC ASSISTED TOTAL HYSTERECTOMY Bilateral 12/05/2021   Procedure: XI ROBOTIC ASSISTED TOTAL HYSTERECTOMY WITH BILATERAL SALPINGECTOMY;  Surgeon: Lavoie, Marie-Lyne, MD;  Location: [facility];  Service: Gynecology;  Laterality: Bilateral;   SKIN LESION EXCISION  2012   basal cell removed no problems since, sees dermatology prn now   uterine ablation  2018   WISDOM TOOTH EXTRACTION     yrs ago per pt on 11-28-2021    Prior to Admission medications  Medication Sig Start Date End Date Taking? Authorizing Provider  bisoprolol -hydrochlorothiazide  (ZIAC ) 10-6.25 MG tablet Take 1 tablet by mouth daily. 05/10/24  Yes Caudle, Thersia Bitters, FNP  Continuous Glucose Sensor (DEXCOM G7 SENSOR) MISC Apply 1 sensor to  skin every 10 days 03/15/24  Yes Caudle, Thersia Bitters, FNP  FLUoxetine  (PROZAC ) 10 MG tablet Take 1 tablet (10 mg total) by mouth daily for 7 days, THEN 0.5 tablets (5 mg total) daily for 7 days, THEN 0.5 tablets (5 mg total) every other day for 7 days. 12/06/24 12/28/24 Yes Caudle, Thersia Bitters, FNP  loratadine  (CLARITIN ) 10 MG tablet Take 1 tablet (10 mg total) by mouth daily. 05/10/24  Yes Caudle, Thersia Bitters, FNP  losartan  (COZAAR ) 50 MG tablet Take 1 tablet (50 mg total) by mouth daily. 10/25/24  Yes Caudle, Thersia Bitters, FNP  omeprazole  (PRILOSEC) 40 MG capsule Take 1 capsule (40 mg total) by  mouth daily. 05/10/24  Yes Caudle, Thersia Bitters, FNP  rosuvastatin  (CRESTOR ) 5 MG tablet Take 1 tablet (5 mg total) by mouth daily. 08/18/24  Yes Caudle, Thersia Bitters, FNP  VITAMIN D , CHOLECALCIFEROL, PO Take 1 capsule by mouth daily.   Yes [provider]  Dulaglutide  0.75 MG/0.5ML SOAJ Inject 0.75 mg into the skin once a week. 05/10/24   Caudle, Thersia Bitters, FNP  Omega-3 Fatty Acids (FISH OIL CONCENTRATE PO) Take 1 capsule by mouth daily.    [provider]  triamcinolone  cream (KENALOG ) 0.1 % Apply topically 2 (two) times daily as needed. 05/10/24   Caudle, Thersia Bitters, FNP  venlafaxine  XR (EFFEXOR -XR) 37.5 MG 24 hr capsule Take 1 capsule (37.5 mg total) by mouth daily with breakfast. Patient not taking: Reported on 12/16/2024 12/06/24   Knute Thersia Bitters, FNP  zolpidem  (AMBIEN  CR) 6.25 MG CR tablet Take 1 tablet (6.25 mg total) by mouth at bedtime as needed for sleep. 10/27/24   Knute Thersia Bitters, FNP    Current Outpatient Medications  Medication Sig Dispense Refill   bisoprolol -hydrochlorothiazide  (ZIAC ) 10-6.25 MG tablet Take 1 tablet by mouth daily. 90 tablet 3   Continuous Glucose Sensor (DEXCOM G7 SENSOR) MISC Apply 1 sensor to skin every 10 days 3 each 12   FLUoxetine  (PROZAC ) 10 MG tablet Take 1 tablet (10 mg total) by mouth daily for 7 days, THEN 0.5 tablets (5 mg total) daily for 7 days, THEN 0.5 tablets (5 mg total) every other day for 7 days. 15 tablet 0   loratadine  (CLARITIN ) 10 MG tablet Take 1 tablet (10 mg total) by mouth daily. 90 tablet 3   losartan  (COZAAR ) 50 MG tablet Take 1 tablet (50 mg total) by mouth daily. 30 tablet 3   omeprazole  (PRILOSEC) 40 MG capsule Take 1 capsule (40 mg total) by mouth daily. 90 capsule 3   rosuvastatin  (CRESTOR ) 5 MG tablet Take 1 tablet (5 mg total) by mouth daily. 90 tablet 3   VITAMIN D , CHOLECALCIFEROL, PO Take 1 capsule by mouth daily.     Dulaglutide  0.75 MG/0.5ML SOAJ Inject 0.75 mg into the skin once a  week. 6 mL 3   Omega-3 Fatty Acids (FISH OIL CONCENTRATE PO) Take 1 capsule by mouth daily.     triamcinolone  cream (KENALOG ) 0.1 % Apply topically 2 (two) times daily as needed. 30 g 2   venlafaxine  XR (EFFEXOR -XR) 37.5 MG 24 hr capsule Take 1 capsule (37.5 mg total) by mouth daily with breakfast. (Patient not taking: Reported on 12/16/2024) 30 capsule 3   zolpidem  (AMBIEN  CR) 6.25 MG CR tablet Take 1 tablet (6.25 mg total) by mouth at bedtime as needed for sleep. 30 tablet 2   Current Facility-Administered Medications  Medication Dose Route Frequency Provider Last Rate Last Admin   0.9 %  sodium  chloride infusion  500 mL Intravenous Once Stacia Glendia BRAVO, MD        Allergies as of 12/16/2024 - Review Complete 12/16/2024  Allergen Reaction Noted   Macrodantin  [nitrofurantoin ] Hives 02/16/2020   Amitriptyline Other (See Comments) 01/08/2016   Chlordiazepoxide Other (See Comments) 01/08/2016    Family History  Problem Relation Age of Onset   Hypertension Mother    Colon cancer Mother 32   Hypertension Father    Hypertension Maternal Aunt    Stomach cancer Maternal Grandmother    Cancer Maternal Grandmother        small intestine    Hypertension Maternal Grandmother    Pancreatic cancer Maternal Grandfather    Diabetes Paternal Grandmother    BRCA 1/2 Neg Hx    Breast cancer Neg Hx    Rectal cancer Neg Hx    Esophageal cancer Neg Hx     Social History   Socioeconomic History   Marital status: Single    Spouse name: Not on file   Number of children: Not on file   Years of education: Not on file   Highest education level: Not on file  Occupational History   Not on file  Tobacco Use   Smoking status: Never   Smokeless tobacco: Never  Vaping Use   Vaping status: Never Used  Substance and Sexual Activity   Alcohol use: Yes    Comment: periodically   Drug use: No   Sexual activity: Yes    Partners: Male    Birth control/protection: Surgical    Comment: 1st  intercourse- 18, hysterectomy  Other Topics Concern   Not on file  Social History Narrative   Not on file   Social Drivers of Health   Tobacco Use: Low Risk (12/16/2024)   Patient History    Smoking Tobacco Use: Never    Smokeless Tobacco Use: Never    Passive Exposure: Not on file  Financial Resource Strain: Low Risk (03/15/2024)   Overall Financial Resource Strain (CARDIA)    Difficulty of Paying Living Expenses: Not very hard  Food Insecurity: No Food Insecurity (12/06/2024)   Epic    Worried About Radiation Protection Practitioner of Food in the Last Year: Never true    Ran Out of Food in the Last Year: Never true  Transportation Needs: No Transportation Needs (12/06/2024)   Epic    Lack of Transportation (Medical): No    Lack of Transportation (Non-Medical): No  Physical Activity: Insufficiently Active (03/15/2024)   Exercise Vital Sign    Days of Exercise per Week: 1 day    Minutes of Exercise per Session: 30 min  Stress: Stress Concern Present (03/15/2024)   [employer] of Occupational Health - Occupational Stress Questionnaire    Feeling of Stress : To some extent  Social Connections: Socially Isolated (03/15/2024)   Social Connection and Isolation Panel    Frequency of Communication with Friends and Family: Once a week    Frequency of Social Gatherings with Friends and Family: Never    Attends Religious Services: 1 to 4 times per year    Active Member of [employer] or Organizations: No    Attends Banker Meetings: Never    Marital Status: Never married  Intimate Partner Violence: Not At Risk (03/15/2024)   Humiliation, Afraid, Rape, and Kick questionnaire    Fear of Current or Ex-Partner: No    Emotionally Abused: No    Physically Abused: No    Sexually Abused: No  Depression (PHQ2-9): Low  Risk (12/14/2024)   Depression (PHQ2-9)    PHQ-2 Score: 0  Recent Concern: Depression (PHQ2-9) - Medium Risk (10/08/2024)   Depression (PHQ2-9)    PHQ-2 Score: 5  Alcohol Screen: Low  Risk (03/15/2024)   Alcohol Screen    Last Alcohol Screening Score (AUDIT): 1  Housing: Low Risk (12/06/2024)   Epic    Unable to Pay for Housing in the Last Year: No    Number of Times Moved in the Last Year: 0    Homeless in the Last Year: No  Utilities: Not At Risk (12/06/2024)   Epic    Threatened with loss of utilities: No  Health Literacy: Adequate Health Literacy (03/15/2024)   B1300 Health Literacy    Frequency of need for help with medical instructions: Never    Review of Systems:  All other review of systems negative except as mentioned in the HPI.  Physical Exam: Vital signs BP (!) 146/82   Pulse 67   Temp (!) 97.5 F (36.4 C) (Temporal)   Ht 5' 4.75 (1.645 m)   Wt 178 lb (80.7 kg)   LMP  (LMP Unknown) Comment: no menustral cycle since 2018 uterine ablation  SpO2 100%   BMI 29.85 kg/m   General:   Alert,  Well-developed, well-nourished, pleasant and cooperative in NAD Airway:  Mallampati 1 Lungs:  Clear throughout to auscultation.   Heart:  Regular rate and rhythm; no murmurs, clicks, rubs,  or gallops. Abdomen:  Soft, nontender and nondistended. Normal bowel sounds.   Neuro/Psych:  Normal mood and affect. A and O x 3   Trenee Igoe E. Stacia, MD [facility]

## 2024-12-16 NOTE — Progress Notes (Signed)
 Called to room to assist during endoscopic procedure.  Patient ID and intended procedure confirmed with present staff. Received instructions for my participation in the procedure from the performing physician.

## 2024-12-17 ENCOUNTER — Other Ambulatory Visit (HOSPITAL_BASED_OUTPATIENT_CLINIC_OR_DEPARTMENT_OTHER): Payer: Self-pay

## 2024-12-17 ENCOUNTER — Ambulatory Visit: Payer: Self-pay | Admitting: Obstetrics and Gynecology

## 2024-12-17 ENCOUNTER — Telehealth: Payer: Self-pay

## 2024-12-17 MED ORDER — METRONIDAZOLE 500 MG PO TABS
500.0000 mg | ORAL_TABLET | Freq: Two times a day (BID) | ORAL | 0 refills | Status: AC
  Filled 2024-12-17: qty 14, 7d supply, fill #0

## 2024-12-17 NOTE — Telephone Encounter (Signed)
 Post procedure follow up call, no answer

## 2024-12-17 NOTE — Progress Notes (Signed)
 Results and recommendations seen by patient.  No Rx sent previously. Rx to pharmacy on file.

## 2024-12-21 LAB — SURGICAL PATHOLOGY

## 2024-12-24 ENCOUNTER — Ambulatory Visit: Payer: Self-pay | Admitting: Gastroenterology

## 2024-12-24 ENCOUNTER — Other Ambulatory Visit (HOSPITAL_BASED_OUTPATIENT_CLINIC_OR_DEPARTMENT_OTHER)

## 2024-12-24 NOTE — Progress Notes (Signed)
 Sheri Murphy,  The polyp removed from the hepatic flexure apparently was not retrieved (the sample thought to represent the resected polyp was stool debris instead; this is more likely to happen when there is excessive residual stool in the colon that interferes with collection of resected polyps).   The polyps in the rectum and sigmoid colon were hyperplastic polyps.  These are not considered precancerous.  As previously discussed, repeat colonoscopy in 5 years due to borderline bowel prep quality, presence of polyp technically difficult procedure.

## 2024-12-28 DIAGNOSIS — E1165 Type 2 diabetes mellitus with hyperglycemia: Secondary | ICD-10-CM | POA: Diagnosis not present

## 2024-12-29 ENCOUNTER — Other Ambulatory Visit (HOSPITAL_BASED_OUTPATIENT_CLINIC_OR_DEPARTMENT_OTHER)

## 2025-01-08 ENCOUNTER — Other Ambulatory Visit (HOSPITAL_BASED_OUTPATIENT_CLINIC_OR_DEPARTMENT_OTHER): Payer: Self-pay

## 2025-01-10 ENCOUNTER — Other Ambulatory Visit (HOSPITAL_BASED_OUTPATIENT_CLINIC_OR_DEPARTMENT_OTHER): Payer: Self-pay

## 2025-01-10 MED ORDER — TRULICITY 1.5 MG/0.5ML ~~LOC~~ SOAJ
1.5000 mg | SUBCUTANEOUS | 1 refills | Status: DC
  Filled 2025-01-11: qty 2, 28d supply, fill #0

## 2025-01-11 ENCOUNTER — Other Ambulatory Visit (HOSPITAL_BASED_OUTPATIENT_CLINIC_OR_DEPARTMENT_OTHER): Payer: Self-pay

## 2025-01-19 ENCOUNTER — Encounter

## 2025-01-19 ENCOUNTER — Other Ambulatory Visit

## 2025-01-22 ENCOUNTER — Other Ambulatory Visit (HOSPITAL_COMMUNITY): Payer: Self-pay

## 2025-01-24 ENCOUNTER — Other Ambulatory Visit: Payer: Self-pay

## 2025-01-25 ENCOUNTER — Encounter (HOSPITAL_BASED_OUTPATIENT_CLINIC_OR_DEPARTMENT_OTHER): Payer: Self-pay | Admitting: Family Medicine

## 2025-01-25 NOTE — Telephone Encounter (Signed)
 Please see mychart message sent by pt and advise.

## 2025-01-26 ENCOUNTER — Ambulatory Visit (INDEPENDENT_AMBULATORY_CARE_PROVIDER_SITE_OTHER)

## 2025-01-26 ENCOUNTER — Ambulatory Visit (INDEPENDENT_AMBULATORY_CARE_PROVIDER_SITE_OTHER): Admitting: Family Medicine

## 2025-01-26 ENCOUNTER — Encounter (HOSPITAL_BASED_OUTPATIENT_CLINIC_OR_DEPARTMENT_OTHER): Payer: Self-pay | Admitting: Family Medicine

## 2025-01-26 ENCOUNTER — Ambulatory Visit (HOSPITAL_BASED_OUTPATIENT_CLINIC_OR_DEPARTMENT_OTHER): Payer: Self-pay | Admitting: Family Medicine

## 2025-01-26 VITALS — BP 129/82 | HR 77 | Ht 64.75 in | Wt 176.6 lb

## 2025-01-26 DIAGNOSIS — L500 Allergic urticaria: Secondary | ICD-10-CM | POA: Diagnosis not present

## 2025-01-26 DIAGNOSIS — M720 Palmar fascial fibromatosis [Dupuytren]: Secondary | ICD-10-CM | POA: Diagnosis not present

## 2025-01-26 DIAGNOSIS — M533 Sacrococcygeal disorders, not elsewhere classified: Secondary | ICD-10-CM | POA: Insufficient documentation

## 2025-01-26 DIAGNOSIS — M461 Sacroiliitis, not elsewhere classified: Secondary | ICD-10-CM | POA: Insufficient documentation

## 2025-01-26 NOTE — Progress Notes (Signed)
 "    Subjective:   Sheri Murphy [birth year] 01/26/2025  Chief Complaint  Patient presents with   Tailbone Pain    Pt states she began having pain in her tailbone about 1 year ago. States pain started back up again about a week ago that makes it hard to sit at work and also is painful when she gets up to move around. Does not recall falling for the pain to begin. Pt also states pain in her finger is getting worse and it is now beginning to radiate.    Discussed the use of AI scribe software for clinical note transcription with the patient, who gave verbal consent to proceed.  History of Present Illness Sheri Murphy is a 59 year old female who presents with worsening tailbone pain and Dupuytren's contracture.  She has been experiencing tailbone pain for about a year, which has progressively worsened. The pain is localized to the area where her buttocks meet and feels like a heavy weight pressing down, especially when rising from a seated position. She has tried using different chairs and cushions with cutouts, but these have not provided relief. There is no history of falls or car accidents in the past year. No numbness or tingling down the legs, but she notes a sharp pain on the left side of her hip. She also reports chronic urinary incontinence for several years that has worsened over the past year, and she has switched from a level one to a level two panty liner. Bowel incontinence has occurred a couple of times but is not consistent.  LEFT HAND PAIN:  She reports worsening symptoms of Dupuytren's contracture in her fingers. Previously, her ring fingers were locking, and despite performing stretches at home, the condition has progressed. Referral to Ortho was offered by PCP at last visit. Now, she experiences pain spreading to the palm and swelling in the fingers, with the third finger starting to lock. She describes the pain as very intense and says it feels almost like it is  inflamed.   URTICARIA:  Additionally, she has been experiencing itching and hives for the past two weeks, which she suspects may be related to increased egg consumption. She consumes three to four eggs a day and has a history of sensitivity to eggs. The hives appear on her stomach and other parts of her body. She uses oatmeal-based eczema cream to alleviate the itching. She also reports experiencing intense GERD symptoms when reaction occurs.    The following portions of the patient's history were reviewed and updated as appropriate: past medical history, past surgical history, family history, social history, allergies, medications, and problem list.   Patient Active Problem List   Diagnosis Date Noted   Dupuytren's contracture of both hands 12/06/2024   Palpitations 12/06/2024   Abnormal mammogram of right breast 12/25/2023   OSA on CPAP 10/04/2020   Lumbar radiculopathy 12/03/2018   Chronic left-sided thoracic back pain 05/15/2018   Constipation 04/14/2018   Gastroesophageal reflux disease without esophagitis 04/14/2018   Fibroid uterus 03/30/2018   Hypertension 03/30/2018   Allergic rhinitis with postnasal drip 09/30/2017   Mild episode of recurrent major depressive disorder 09/30/2017   History of renal cell carcinoma 08/18/2017   Anemia 01/01/2017   Hyperlipidemia associated with type 2 diabetes mellitus (HCC) 08/27/2016   Chronic tension-type headache, not intractable 08/27/2016   Low vitamin D  level 08/27/2016   Controlled type 2 diabetes mellitus without complication, without long-term current use of insulin  (HCC) 02/02/2016   Anxiety  and depression 01/08/2016   Benign essential HTN 01/08/2016   Uterus, adenomyosis 12/14/2013   Insomnia 08/13/2011   Obesity 08/09/2011   Past Medical History:  Diagnosis Date   Abnormal liver function 01/08/2016   Anemia    Anxiety    Constipation 11/28/2021   Contracture of right Achilles tendon 12/31/2018   Controlled type 2 diabetes  mellitus without complication (HCC)    Cough 04/04/2020   Last Assessment & Plan:    I still feel related to allergies, laryngopharyngeal reflux and differential  Advised her to have a chest x-ray since she has been coughing over a year now.  Continue Zyrtec, add Singulair nightly for 1 month, recheck in 1 month.  She is on lisinopril but is pretty sure symptoms started prior to starting lisinopril and is on a very low dose for renal protection.  No impr   Dysuria 08/09/2011   Fatty liver    hx of elevated liver enzymes in past   Fibroids    Fibromyalgia    GERD (gastroesophageal reflux disease)    Headache 08/13/2011   IMO Update for Oct 2023 made by user on 10.3.23     Heart murmur    mild no cardiologist   Hematuria    History of COVID-19 04/2021   fever cough and cold symptoms, took oral antivirals, all symptoms resolved   Hypertension    IBS (irritable bowel syndrome)    Iliotibial band syndrome affecting left lower leg 02/02/2016   Insomnia    Menorrhagia    Obesity    Pelvic pain 11/28/2021   recurrent   Postoperative state 12/05/2021   Renal cell carcinoma, left (HCC) 2018   surgery only chemo/radiation not required, small spot removed   Urinary incontinence 11/28/2021   weras small pad   Urinary urgency    Vitamin D  deficiency    Wears glasses    Past Surgical History:  Procedure Laterality Date   BREAST BIOPSY Right 2021   benign   COLONOSCOPY  2021   DILATATION & CURETTAGE/HYSTEROSCOPY WITH MYOSURE N/A 05/13/2018   Procedure: DILATATION & CURETTAGE/HYSTEROSCOPY WITH NOVASURE;  Surgeon: Lavoie, Marie-Lyne, MD;  Location: [facility];  Service: Gynecology;  Laterality: N/A;   Left renal mass removal  07/2017   Thermal Microwave Ablation   ROBOTIC ASSISTED TOTAL HYSTERECTOMY Bilateral 12/05/2021   Procedure: XI ROBOTIC ASSISTED TOTAL HYSTERECTOMY WITH BILATERAL SALPINGECTOMY;  Surgeon: Lavoie, Marie-Lyne, MD;  Location: [facility] [place] [place];   Service: Gynecology;  Laterality: Bilateral;   SKIN LESION EXCISION  2012   basal cell removed no problems since, sees dermatology prn now   uterine ablation  2018   WISDOM TOOTH EXTRACTION     yrs ago per pt on 11-28-2021   Family History  Problem Relation Age of Onset   Hypertension Mother    Colon cancer Mother 18   Hypertension Father    Hypertension Maternal Aunt    Stomach cancer Maternal Grandmother    Cancer Maternal Grandmother        small intestine    Hypertension Maternal Grandmother    Pancreatic cancer Maternal Grandfather    Diabetes Paternal Grandmother    BRCA 1/2 Neg Hx    Breast cancer Neg Hx    Rectal cancer Neg Hx    Esophageal cancer Neg Hx    Outpatient Medications Prior to Visit  Medication Sig Dispense Refill   bisoprolol -hydrochlorothiazide  (ZIAC ) 10-6.25 MG tablet Take 1 tablet by mouth daily. 90 tablet 3  Dulaglutide  0.75 MG/0.5ML SOAJ Inject 0.75 mg into the skin once a week. 6 mL 3   loratadine  (CLARITIN ) 10 MG tablet Take 1 tablet (10 mg total) by mouth daily. 90 tablet 3   losartan  (COZAAR ) 50 MG tablet Take 1 tablet (50 mg total) by mouth daily. 30 tablet 3   omeprazole  (PRILOSEC) 40 MG capsule Take 1 capsule (40 mg total) by mouth daily. 90 capsule 3   rosuvastatin  (CRESTOR ) 5 MG tablet Take 1 tablet (5 mg total) by mouth daily. 90 tablet 3   triamcinolone  cream (KENALOG ) 0.1 % Apply topically 2 (two) times daily as needed. 30 g 2   zolpidem  (AMBIEN  CR) 6.25 MG CR tablet Take 1 tablet (6.25 mg total) by mouth at bedtime as needed for sleep. 30 tablet 2   Continuous Glucose Sensor (DEXCOM G7 SENSOR) MISC Apply 1 sensor to skin every 10 days 3 each 12   No facility-administered medications prior to visit.   Allergies[1]   ROS: A complete ROS was performed with pertinent positives/negatives noted in the HPI. The remainder of the ROS are negative.    Objective:   Today's Vitals   01/26/25 0855  BP: 129/82  Pulse: 77  SpO2: 98%  Weight:  176 lb 9.6 oz (80.1 kg)  Height: 5' 4.75 (1.645 m)    Physical Exam   GENERAL: Well-appearing, in NAD. Well nourished.  SKIN: Pink, warm and dry. Small macular red lesion present to left knee.  Head: Normocephalic. NECK: Trachea midline. Full ROM w/o pain or tenderness.  THROAT: Uvula midline. Oropharynx clear.  Mucous membranes pink and moist.  RESPIRATORY: Chest wall symmetrical. Respirations even and non-labored.  MSK: Muscle tone and strength appropriate for age. Mild tenderness without obvious deformity or swelling to left 3rd and 4th metacarpals. Difficulty with extension of left 4th digit. Tenderness without deformity to coccyx and sacrum. No step off or crepitus. Negative straight leg raise. Full ROM present to spine.  EXTREMITIES: Without clubbing, cyanosis, or edema.  NEUROLOGIC: No motor or sensory deficits. Steady, even gait. C2-C12 intact.  PSYCH/MENTAL STATUS: Alert, oriented x 3. Cooperative, appropriate mood and affect.       Assessment & Plan:  1. Dupuytren's contracture of both hands (Primary) Progression with increased pain and swelling in ring and middle fingers. Possible arthritis contributing. Steroid injection recommended for direct relief. - Referred to orthopedics for steroid injection in the hand. - Ambulatory referral to Orthopedic Surgery  2. Coccyx pain Chronic coccyx pain for a year, likely due to misalignment or possible compression fracture. Increased urinary incontinence may relate to spinal cord involvement. Continue OTC pain relief at this time, ice and heat.  - Ordered x-ray of sacrum and coccyx. - Consider MRI if x-ray indicates further investigation. - Discuss potential need for physical therapy if MRI is required. - DG Sacrum/Coccyx; Future  3. 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 Symptoms include full-body hives and burning  sensation. Possible food allergy to eggs. - Advised avoidance of eggs. - Recommended Benadryl and Pepcid for symptom relief. - Offered blood testing for food sensitivities or skin testing with allergy and immunology. Patient will consider.  - List of egg ingredients and egg related proteins in food provided to patient to avoid.  - Educated on signs and symptoms of anaphylaxis and pt verbalized understanding and to call 911. Will consider Epi-Pen Rx.   Return if symptoms worsen or fail to  improve.    Patient to reach out to office if new, worrisome, or unresolved symptoms arise or if no improvement in patient's condition. Patient verbalized understanding and is agreeable to treatment plan. All questions answered to patient's satisfaction.    Thersia Schuyler Stark, FNP    [1]  Allergies Allergen Reactions   Macrodantin  [email or web address] ] Hives   Amitriptyline Other (See Comments)    nightmares   Chlordiazepoxide Other (See Comments)    nightmares   "

## 2025-01-26 NOTE — Patient Instructions (Addendum)
 Go to 2nd Floor for Xray (orthopedics)    Allergy:  Use Benadryl as needed for allergic reaction for 3-5 days - only use if at home and resting due to drowsiness Pepcid (Famotidine) twice daily for 3-5 days   Consider lab testing and/or referral to Allergy/Asthma.    [email or web address]

## 2025-01-26 NOTE — Progress Notes (Signed)
 Hi Sheri Murphy,  Your xray does not show acute fracture or alignment issues. There were some mild degenerative (wear and tear) changes to your sacroiliac joints bilaterally. This can contribute to ongoing pain especially with movement. We can try formal PT if you would like and I will also provide a link to stretching in your mychart After visit summary as well. If you would like the PT referral, please let me know. Obtaining a further image of MRI can also be performed if no improvement with PT.

## 2025-01-27 ENCOUNTER — Other Ambulatory Visit (HOSPITAL_BASED_OUTPATIENT_CLINIC_OR_DEPARTMENT_OTHER): Payer: Self-pay | Admitting: Family Medicine

## 2025-01-27 DIAGNOSIS — M461 Sacroiliitis, not elsewhere classified: Secondary | ICD-10-CM

## 2025-01-31 NOTE — Telephone Encounter (Signed)
 Please see new mychart sent by pt.

## 2025-02-02 ENCOUNTER — Other Ambulatory Visit (HOSPITAL_BASED_OUTPATIENT_CLINIC_OR_DEPARTMENT_OTHER): Payer: Self-pay | Admitting: Family Medicine

## 2025-02-02 DIAGNOSIS — M461 Sacroiliitis, not elsewhere classified: Secondary | ICD-10-CM

## 2025-02-07 ENCOUNTER — Ambulatory Visit (HOSPITAL_BASED_OUTPATIENT_CLINIC_OR_DEPARTMENT_OTHER): Admitting: Family Medicine

## 2025-02-14 ENCOUNTER — Ambulatory Visit: Admitting: Orthopedic Surgery

## 2025-02-16 ENCOUNTER — Other Ambulatory Visit

## 2025-02-20 ENCOUNTER — Other Ambulatory Visit

## 2025-05-13 ENCOUNTER — Encounter (HOSPITAL_BASED_OUTPATIENT_CLINIC_OR_DEPARTMENT_OTHER): Admitting: Family Medicine

## 2025-05-20 ENCOUNTER — Encounter (HOSPITAL_BASED_OUTPATIENT_CLINIC_OR_DEPARTMENT_OTHER): Admitting: Family Medicine

## 2025-08-24 ENCOUNTER — Ambulatory Visit: Admitting: Physician Assistant

## 2025-12-01 ENCOUNTER — Other Ambulatory Visit

## 2025-12-01 ENCOUNTER — Encounter
